# Patient Record
Sex: Female | Born: 1965 | Race: White | Hispanic: No | Smoking: Former smoker
Health system: Southern US, Community
[De-identification: ages and names within clinical notes are randomized; demographics above are authoritative.]

## PROBLEM LIST (undated history)

## (undated) DIAGNOSIS — E7800 Pure hypercholesterolemia, unspecified: Secondary | ICD-10-CM

## (undated) DIAGNOSIS — G35 Multiple sclerosis: Secondary | ICD-10-CM

## (undated) DIAGNOSIS — M545 Low back pain, unspecified: Secondary | ICD-10-CM

## (undated) DIAGNOSIS — R569 Unspecified convulsions: Secondary | ICD-10-CM

## (undated) DIAGNOSIS — G35D Multiple sclerosis, unspecified: Secondary | ICD-10-CM

## (undated) DIAGNOSIS — F0280 Dementia in other diseases classified elsewhere without behavioral disturbance: Secondary | ICD-10-CM

## (undated) DIAGNOSIS — E785 Hyperlipidemia, unspecified: Secondary | ICD-10-CM

## (undated) DIAGNOSIS — I10 Essential (primary) hypertension: Secondary | ICD-10-CM

## (undated) DIAGNOSIS — E559 Vitamin D deficiency, unspecified: Secondary | ICD-10-CM

## (undated) HISTORY — PX: OOPHORECTOMY: SHX86

## (undated) HISTORY — DX: Dementia in other diseases classified elsewhere, unspecified severity, without behavioral disturbance, psychotic disturbance, mood disturbance, and anxiety: F02.80

## (undated) HISTORY — DX: Multiple sclerosis, unspecified: G35.D

## (undated) HISTORY — DX: Essential (primary) hypertension: I10

## (undated) HISTORY — DX: Hyperlipidemia, unspecified: E78.5

## (undated) HISTORY — PX: ABDOMINAL HYSTERECTOMY: SHX81

## (undated) HISTORY — DX: Pure hypercholesterolemia, unspecified: E78.00

## (undated) HISTORY — DX: Unspecified convulsions: R56.9

## (undated) HISTORY — DX: Low back pain, unspecified: M54.50

## (undated) HISTORY — DX: Multiple sclerosis: G35

## (undated) HISTORY — DX: Vitamin D deficiency, unspecified: E55.9

---
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
## 2006-12-12 DIAGNOSIS — G35 Multiple sclerosis: Secondary | ICD-10-CM | POA: Insufficient documentation

## 2015-11-14 DIAGNOSIS — F0280 Dementia in other diseases classified elsewhere without behavioral disturbance: Secondary | ICD-10-CM | POA: Insufficient documentation

## 2017-07-16 DIAGNOSIS — R569 Unspecified convulsions: Secondary | ICD-10-CM | POA: Insufficient documentation

## 2017-12-19 DIAGNOSIS — Z993 Dependence on wheelchair: Secondary | ICD-10-CM | POA: Insufficient documentation

## 2019-06-05 ENCOUNTER — Ambulatory Visit: Payer: Medicaid Other | Admitting: Neurology

## 2019-06-12 ENCOUNTER — Encounter: Payer: Self-pay | Admitting: Neurology

## 2019-06-12 ENCOUNTER — Other Ambulatory Visit: Payer: Self-pay

## 2019-06-12 ENCOUNTER — Ambulatory Visit (INDEPENDENT_AMBULATORY_CARE_PROVIDER_SITE_OTHER): Payer: Medicare Other | Admitting: Neurology

## 2019-06-12 VITALS — BP 130/76 | HR 78 | Temp 97.6°F

## 2019-06-12 DIAGNOSIS — R569 Unspecified convulsions: Secondary | ICD-10-CM

## 2019-06-12 DIAGNOSIS — G35 Multiple sclerosis: Secondary | ICD-10-CM | POA: Diagnosis not present

## 2019-06-12 DIAGNOSIS — Z79899 Other long term (current) drug therapy: Secondary | ICD-10-CM | POA: Insufficient documentation

## 2019-06-12 DIAGNOSIS — F0280 Dementia in other diseases classified elsewhere without behavioral disturbance: Secondary | ICD-10-CM

## 2019-06-12 DIAGNOSIS — Z993 Dependence on wheelchair: Secondary | ICD-10-CM

## 2019-06-12 NOTE — Progress Notes (Signed)
[facility]  PATIENT: Brandy Mckinney DOB: [birth year]  REFERRING DOCTOR OR PCP: Salli Real MD SOURCE: Patient, notes from primary care, imaging and lab reports  _________________________________   HISTORICAL  CHIEF COMPLAINT:  Chief Complaint  Patient presents with  . New Patient (Initial Visit)    RM 13 referred by Salli Real, MD for MS  . Multiple Sclerosis    Was dx when she was 19; wheelchair bound last medication she was on was ocrevus cannot remember the last infusion     HISTORY OF PRESENT ILLNESS:  I had the pleasure of seeing your patient, Brandy Mckinney, at the MS center at [facility] for neurologic consultation regarding her multiple sclerosis.  She is a 53 year old woman who was diagnosed with MS at age 53 after presenting with optic neuritis in both eyes.  Over the next few years, she began to experience more difficulty with her gait.  This steadily progressed in her 51s 30s and 30s.  For many years, she used a walker.  About 7 or 8 years ago after repeated falls she stopped using the walker and became wheelchair-bound.  At some point, she was started on Avonex.  She remained on this medication for many years.  She continued to progress and was switched to Tecfidera.  She stayed on that medication for several years and then about 2 years ago was switched to Ocrevus.  Her last dose of Ocrevus was December 2019.  Her brother (power of attorney) reports that transportation has been a problem as she has the most difficulty getting out of the house.  Therefore, they would like to discuss different disease modifying therapies.  Most recently, she was seeing Dr. [name] in [place].  They moved from [place] about 3 months ago and are trying to establish neurologic care for her MS.  Currently, she is unable to walk.  She can help with transfers.  She is wheelchair bound and has left > right arm and leg weakness and spasticity.    Vision is  out of either eye but she can read large print out of either eye.  She cannot read small print.   She has urinary incontinence.   A catheter was stopped due to frequent UTI.      She has some memory loss and reduced attention.    She sleeps poorly some nights and has an irregular sleep schedule.   She usually sleeps 4-5 hours on one stretch and then takes 1 to 2 naps daily.    She has no depression or anxiety.     She also has a history of seizures.  These would occur more frequently in the past and she has not had any for several years.  She is on Keppra and tolerates it well.  She used to have a Home Health Aide covered by [place].    Data: I personally reviewed the MRI of the brain dated 02/19/2019 [facility]): It shows multiple T2/flair hyperintense foci in the brainstem, cerebellum and in the periventricular, juxtacortical and deep white matter of the hemispheres in a pattern compatible with MS.  There were no enhancing lesions.  I reviewed lab tests from 02/19/2019: CBC was normal.  Lymphocyte count was normal.  She had an mildly elevated ALT but normal AST.  Vitamin D was normal.  Hepatitis Be antigen and hepatitis B core antibody were negative.  Hep C antibody and RNA was negative.  HIV was negative.  QuantiFERON TB was negative.  Varicella-zoster IgG showed immunity.   REVIEW OF SYSTEMS: Constitutional: No fevers, chills, sweats, or change in appetite.  She has some insomnia and irregular sleep. Eyes: No eye pain.  She has reduced visual acuity. Ear, nose and throat: No hearing loss, ear pain, nasal congestion, sore throat Cardiovascular: No chest pain, palpitations Respiratory: No shortness of breath at rest or with exertion.   No wheezes GastrointestinaI: No nausea, vomiting, diarrhea, abdominal pain, fecal incontinence Genitourinary:She has incontinence  musculoskeletal:She notes myalgias  integumentary: No rash, pruritus, skin lesions Neurological: as  above Psychiatric: No depression at this time.  No anxiety Endocrine: No palpitations, diaphoresis, change in appetite, change in weigh or increased thirst Hematologic/Lymphatic: No anemia, purpura, petechiae. Allergic/Immunologic: No itchy/runny eyes, nasal congestion, recent allergic reactions, rashes  ALLERGIES: Allergies  Allergen Reactions  . Fentanyl     Other reaction(s): can not have anything on her skin    HOME MEDICATIONS:  Current Outpatient Medications:  .  Ascorbic Acid (VITAMIN C) 100 MG tablet, Take by mouth., Disp: , Rfl:  .  calcium citrate-vitamin D (CITRACAL+D) 315-200 MG-UNIT tablet, Take by mouth., Disp: , Rfl:  .  cyclobenzaprine (FLEXERIL) 10 MG tablet, Take 10 mg by mouth 3 (three) times daily., Disp: , Rfl:  .  FLUoxetine (PROZAC) 20 MG capsule, Take by mouth., Disp: , Rfl:  .  gabapentin (NEURONTIN) 300 MG capsule, TAKE 1 CAPSULE BY MOUTH 3 TIMES DAILY, Disp: , Rfl:  .  Inulin (FIBER CHOICE PO), Take by mouth., Disp: , Rfl:  .  levETIRAcetam (KEPPRA) 750 MG tablet, Take 750 mg by mouth 2 (two) times daily., Disp: , Rfl:  .  lisinopril (ZESTRIL) 20 MG tablet, Take by mouth., Disp: , Rfl:  .  Multiple Vitamins-Minerals (MULTIVITAMIN ADULT PO), Take by mouth., Disp: , Rfl:  .  omeprazole (PRILOSEC) 40 MG capsule, Take 40 mg by mouth daily., Disp: , Rfl:  .  simvastatin (ZOCOR) 20 MG tablet, Take by mouth., Disp: , Rfl:  .  VITAMIN D PO, Take by mouth., Disp: , Rfl:   PAST MEDICAL HISTORY: Past Medical History:  Diagnosis Date  . Early onset Alzheimer's dementia ([place])   . Hypercholesteremia   . Hyperlipidemia   . Hypertension   . Lumbosacral pain   . Multiple sclerosis ([place])   . Seizure ([place])   . Vitamin D deficiency     PAST SURGICAL HISTORY:   FAMILY HISTORY: No family history on file.  SOCIAL HISTORY:  Social History   Socioeconomic History  . Marital status: Divorced    Spouse name: Not on file  . Number of children: Not on file  .  Years of education: Not on file  . Highest education level: Not on file  Occupational History  . Not on file  Social Needs  . Financial resource strain: Not on file  . Food insecurity    Worry: Not on file    Inability: Not on file  . Transportation needs    Medical: Not on file    Non-medical: Not on file  Tobacco Use  . Smoking status: Former Smoker    Quit date: 2012    Years since quitting: 8.8  . Smokeless tobacco: Never Used  Substance and Sexual Activity  . Alcohol use: Not Currently  . Drug use: Never  . Sexual activity: Not on file  Lifestyle  . Physical activity    Days per week: Not on file    Minutes per session: Not on file  .  Stress: Not on file  Relationships  . Social Musician on phone: Not on file    Gets together: Not on file    Attends religious service: Not on file    Active member of club or organization: Not on file    Attends meetings of clubs or organizations: Not on file    Relationship status: Not on file  . Intimate partner violence    Fear of current or ex partner: Not on file    Emotionally abused: Not on file    Physically abused: Not on file    Forced sexual activity: Not on file  Other Topics Concern  . Not on file  Social History Narrative   Lives at home with brother tommy    Right handed      PHYSICAL EXAM  Vitals:   06/12/19 1324  BP: 130/76  Pulse: 78  Temp: 97.6 F (36.4 C)  TempSrc: Axillary    There is no height or weight on file to calculate BMI.   General: The patient is well-developed and well-nourished and in no acute distress  HEENT:  Head is [place]/AT.  Sclera are anicteric.    Neck: No carotid bruits are noted.  The neck is nontender.  Cardiovascular: The heart has a regular rate and rhythm with a normal S1 and S2. There were no murmurs, gallops or rubs.    Skin: Extremities are without rash or  edema.  She has contractures of the left hand.  Musculoskeletal: She has some muscle tenderness on  deep palpation.   Neurologic Exam  Mental status: The patient is alert and oriented to name and city but not date at the time of the examination.  She had reduced focus and attention and reduced immediate recall.   Speech is normal.  Cranial nerves: Extraocular movements are full. Pupils are equal, round, and reactive to light and accomodation.  Facial symmetry is present. There is good facial sensation to soft touch bilaterally.Facial strength is normal.  Trapezius and sternocleidomastoid strength is normal. No dysarthria is noted.  The tongue is midline, and the patient has symmetric elevation of the soft palate. No obvious hearing deficits are noted.  Motor:  Muscle bulk is normal.   Muscle tone is increased, left greater than right.  Strength was 3/5 in the left arm and 3-4 minus/5 in the left leg.  Strength was 4+/5 in the right arm and 4/5 in the right leg.  Sensory: Sensory testing is intact to pinprick, soft touch and vibration sensation in all 4 extremities.  Coordination: Cerebellar testing reveals good finger-nose-finger and heel-to-shin bilaterally.  Gait and station: Station is normal.   Gait is normal. Tandem gait is normal. Romberg is negative.   Reflexes: Deep tendon reflexes are increased in the legs..   Plantar responses are flexor.      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 Noble is a 53 year old woman with multiple sclerosis who is currently not on any disease modifying therapy.  Transportation has been  a problem for her and she was unable to keep many  of her Ocrevus infusion appointments in the past.  She and her brother most interested in switching to [facility].  We went over the risks and benefits including the risk of cancer.  We also discussed Zeposia as another option.  Due to the short course nature of Mavenclad they would prefer that.  I had her sign a service request form.  Most of her lab work has been done but I will get an updated CBC with differential, liver function test, hepatitis B surface antigen and antibody.  They understand that they would need to have blood work approximately 2 months after the first dose.  Return to see me in 3 months or sooner for new or worsening neurologic symptoms.  She will need to have CBC with differential and hepatic function test at that visit.  4 months after that visit she should return for additional blood work.  Thank you for asking me to see Ms. Sharen HonesGutierrez.  Please let me know if I can be of further assistance with her or other patients in the future.    Bea Duren A. Epimenio FootSater, MD, [facility],FAAN 06/12/2019, 6:09 PM Certified in Neurology, Clinical Neurophysiology, Sleep Medicine and Neuroimaging  [facility] [address], [address] [place], [place] [id number] [phone]

## 2019-06-13 LAB — CBC WITH DIFFERENTIAL/PLATELET
Basophils Absolute: 0.1 10*3/uL (ref 0.0–0.2)
Basos: 0 %
EOS (ABSOLUTE): 0.2 10*3/uL (ref 0.0–0.4)
Eos: 2 %
Hematocrit: 49.1 % — ABNORMAL HIGH (ref 34.0–46.6)
Hemoglobin: 16.2 g/dL — ABNORMAL HIGH (ref 11.1–15.9)
Immature Grans (Abs): 0.1 10*3/uL (ref 0.0–0.1)
Immature Granulocytes: 1 %
Lymphocytes Absolute: 1.7 10*3/uL (ref 0.7–3.1)
Lymphs: 14 %
MCH: 29.6 pg (ref 26.6–33.0)
MCHC: 33 g/dL (ref 31.5–35.7)
MCV: 90 fL (ref 79–97)
Monocytes Absolute: 0.6 10*3/uL (ref 0.1–0.9)
Monocytes: 5 %
Neutrophils Absolute: 9.4 10*3/uL — ABNORMAL HIGH (ref 1.4–7.0)
Neutrophils: 78 %
Platelets: 310 10*3/uL (ref 150–450)
RBC: 5.48 x10E6/uL — ABNORMAL HIGH (ref 3.77–5.28)
RDW: 14.3 % (ref 11.7–15.4)
WBC: 12.1 10*3/uL — ABNORMAL HIGH (ref 3.4–10.8)

## 2019-06-13 LAB — HEPATIC FUNCTION PANEL
ALT: 45 IU/L — ABNORMAL HIGH (ref 0–32)
AST: 29 IU/L (ref 0–40)
Albumin: 4.3 g/dL (ref 3.8–4.9)
Alkaline Phosphatase: 161 IU/L — ABNORMAL HIGH (ref 39–117)
Bilirubin Total: 1 mg/dL (ref 0.0–1.2)
Bilirubin, Direct: 0.31 mg/dL (ref 0.00–0.40)
Total Protein: 7.1 g/dL (ref 6.0–8.5)

## 2019-06-13 LAB — HEPATITIS B SURFACE ANTIGEN: Hepatitis B Surface Ag: NEGATIVE

## 2019-06-13 LAB — HEPATITIS B CORE ANTIBODY, TOTAL: Hep B Core Total Ab: NEGATIVE

## 2019-06-13 LAB — HEPATITIS B SURFACE ANTIBODY,QUALITATIVE: Hep B Surface Ab, Qual: NONREACTIVE

## 2019-06-14 ENCOUNTER — Telehealth: Payer: Self-pay

## 2019-06-14 NOTE — Telephone Encounter (Signed)
[employer] start form has been faxed to [place]. Confirmation received.  First month cycle will be: Day 1- 2 Day 2-2 Day 3- 2 Day 4- 1 Day 5- 1  Second month cycle will be: Day 1- 2 Day 2- 2 Day 3- 1 Day 4- 1 Day 5 -1

## 2019-06-28 NOTE — Telephone Encounter (Signed)
Called [facility] to check on status and make sure they received start form. Spoke with Mikael Spray. She states they received form incomplete on 06/14/2019, missing MD info. They faxed back on 06/14/19 requesting this be completed.   I completed missing fields and re-faxed to [facility] at [phone]. Received fax confirmation.

## 2019-07-11 NOTE — Telephone Encounter (Signed)
Called and spoke with Sharee Pimple w/ [facility] to see status of start form. She states No PA needed but there is information missing on the rx. She was not sure what was missing, placed me on hold to investigate. I relayed we refaxed on 06/28/2019 with completed info.  She is going to forward newer form to pharmacy, old form forwarded to pharmacy in error that had missing info. She will be using [facility] pharmacy Phone: [phone]. Ext: [phone] for MS team. She is going to triage this request. I asked her to place our direct fax: [phone] on file in case they need it.

## 2019-07-12 NOTE — Telephone Encounter (Signed)
Received fax from [place] that pt scheduled delivery of [place] to receive it on 07/17/2019. [facility] ID: [id number].

## 2019-08-20 ENCOUNTER — Telehealth: Payer: Self-pay | Admitting: *Deleted

## 2019-08-20 NOTE — Telephone Encounter (Signed)
Received fax from [facility] that pt scheduled delivery of Mavenclad to receive it on 08/18/2019. [facility] ID: [id number].

## 2019-08-27 ENCOUNTER — Telehealth: Payer: Self-pay | Admitting: *Deleted

## 2019-08-27 NOTE — Telephone Encounter (Signed)
Received initial counseling checklist from [facility] in re: Mavenclad. Pt completed year 1 month 1: 07-18-2019-07-22-19. Pt completed year 1 month 2 08/18/19-08/22/19.

## 2019-09-08 ENCOUNTER — Encounter: Payer: Self-pay | Admitting: Neurology

## 2019-09-18 ENCOUNTER — Other Ambulatory Visit: Payer: Self-pay

## 2019-09-18 ENCOUNTER — Encounter: Payer: Self-pay | Admitting: Neurology

## 2019-09-18 ENCOUNTER — Ambulatory Visit (INDEPENDENT_AMBULATORY_CARE_PROVIDER_SITE_OTHER): Payer: Medicare Other | Admitting: Neurology

## 2019-09-18 VITALS — BP 110/86 | HR 80 | Temp 97.7°F | Ht 62.0 in

## 2019-09-18 DIAGNOSIS — F0280 Dementia in other diseases classified elsewhere without behavioral disturbance: Secondary | ICD-10-CM | POA: Diagnosis not present

## 2019-09-18 DIAGNOSIS — Z993 Dependence on wheelchair: Secondary | ICD-10-CM

## 2019-09-18 DIAGNOSIS — G35 Multiple sclerosis: Secondary | ICD-10-CM

## 2019-09-18 DIAGNOSIS — Z79899 Other long term (current) drug therapy: Secondary | ICD-10-CM | POA: Diagnosis not present

## 2019-09-18 DIAGNOSIS — R569 Unspecified convulsions: Secondary | ICD-10-CM | POA: Diagnosis not present

## 2019-09-18 MED ORDER — ETODOLAC 400 MG PO TABS: 400.0000 mg | ORAL_TABLET | Freq: Two times a day (BID) | ORAL | 5 refills | Status: DC | PRN

## 2019-09-18 NOTE — Progress Notes (Addendum)
[facility]  PATIENT: Brandy Mckinney DOB: February 07, 1966  REFERRING DOCTOR OR PCP: Sandi Mariscal MD SOURCE: Patient, notes from primary care, imaging and lab reports  _________________________________   HISTORICAL  CHIEF COMPLAINT:  Chief Complaint  Patient presents with  . Follow-up    Room 13. Last seen 06/12/2019. In rolling chair today, non-ambulatory.   . Multiple Sclerosis    On Mavenclad, tolerating well. No new sx.    HISTORY OF PRESENT ILLNESS:  Brandy Mckinney is a 54 y.o. woman with multiple sclerosis.  Update 09/18/2019: She started [facility] in December 19 and had her second week starting January 23.   She tolerated it very well.   She denies any change in neurologic function compared to last visit.      Vision is poor but unchanged.    Cognition is about the same.   She often has mild confusion.      She is in a rolling chair.  She is non-ambulatory.    She does not help significantly with transfers.   She is weaker left worse than right.      She was recently been diagnosed with fatty liver and will be having a CT soon.     She is on Keppra for seizures but has not had any recently.    FROM 06/12/2019 CONSULTATION She is a 54 year old woman who was diagnosed with MS at age 80 after presenting with optic neuritis in both eyes.  Over the next few years, she began to experience more difficulty with her gait.  This steadily progressed in her 58s 57s and 33s.  For many years, she used a walker.  About 7 or 8 years ago after repeated falls she stopped using the walker and became wheelchair-bound.  At some point, she was started on Avonex.  She remained on this medication for many years.  She continued to progress and was switched to Tecfidera.  She stayed on that medication for several years and then about 2 years ago was switched to [place].  Her last dose of Ocrevus was December 2019.  Her brother (power of attorney) reports that transportation has  been a problem as she has the most difficulty getting out of the house.  Therefore, they would like to discuss different disease modifying therapies.  Most recently, she was seeing Dr. Earnie Larsson in [place].  They moved from [place] about 3 months ago and are trying to establish neurologic care for her MS.  Currently, she is unable to walk.  She can help with transfers.  She is wheelchair bound and has left > right arm and leg weakness and spasticity.    Vision is out of either eye but she can read large print out of either eye.  She cannot read small print.   She has urinary incontinence.   A catheter was stopped due to frequent UTI.      She has some memory loss and reduced attention.    She sleeps poorly some nights and has an irregular sleep schedule.   She usually sleeps 4-5 hours on one stretch and then takes 1 to 2 naps daily.    She has no depression or anxiety.     She also has a history of seizures.  These would occur more frequently in the past and she has not had any for several years.  She is on Keppra and tolerates it well.  She used to have a [place] covered by [place].  Data: I personally reviewed the MRI of the brain dated 02/19/2019 [facility]): It shows multiple T2/flair hyperintense foci in the brainstem, cerebellum and in the periventricular, juxtacortical and deep white matter of the hemispheres in a pattern compatible with MS.  There were no enhancing lesions.  I reviewed lab tests from 02/19/2019: CBC was normal.  Lymphocyte count was normal.  She had an mildly elevated ALT but normal AST.  Vitamin D was normal.  Hepatitis Be antigen and hepatitis B core antibody were negative.  Hep C antibody and RNA was negative.  HIV was negative.  QuantiFERON TB was negative.  Varicella-zoster IgG showed immunity.   REVIEW OF SYSTEMS: Constitutional: No fevers, chills, sweats, or change in appetite.  She has some insomnia and irregular sleep. Eyes: No  eye pain.  She has reduced visual acuity. Ear, nose and throat: No hearing loss, ear pain, nasal congestion, sore throat Cardiovascular: No chest pain, palpitations Respiratory: No shortness of breath at rest or with exertion.   No wheezes GastrointestinaI: No nausea, vomiting, diarrhea, abdominal pain, fecal incontinence Genitourinary:She has incontinence  musculoskeletal:She notes myalgias  integumentary: No rash, pruritus, skin lesions Neurological: as above Psychiatric: No depression at this time.  No anxiety Endocrine: No palpitations, diaphoresis, change in appetite, change in weigh or increased thirst Hematologic/Lymphatic: No anemia, purpura, petechiae. Allergic/Immunologic: No itchy/runny eyes, nasal congestion, recent allergic reactions, rashes  ALLERGIES: Allergies  Allergen Reactions  . Fentanyl     Other reaction(s): can not have anything on her skin    HOME MEDICATIONS:  Current Outpatient Medications:  .  Ascorbic Acid (VITAMIN C) 100 MG tablet, Take by mouth., Disp: , Rfl:  .  calcium citrate-vitamin D (CITRACAL+D) 315-200 MG-UNIT tablet, Take by mouth., Disp: , Rfl:  .  Cladribine, 8 Tabs, (MAVENCLAD, 8 TABS,) 10 MG TBPK, Take by mouth. Month 1: Take 2 tablets daily on days 1-3, 1 tablet on day 4 and 5.  Month 2: Take 2 tablets daily on days 1-2, 1 tablet on days 3-5., Disp: , Rfl:  .  cyclobenzaprine (FLEXERIL) 10 MG tablet, Take 10 mg by mouth 3 (three) times daily., Disp: , Rfl:  .  FLUoxetine (PROZAC) 20 MG capsule, Take by mouth., Disp: , Rfl:  .  gabapentin (NEURONTIN) 300 MG capsule, TAKE 1 CAPSULE BY MOUTH 3 TIMES DAILY, Disp: , Rfl:  .  Inulin (FIBER CHOICE PO), Take by mouth., Disp: , Rfl:  .  levETIRAcetam (KEPPRA) 750 MG tablet, Take 750 mg by mouth 2 (two) times daily., Disp: , Rfl:  .  lisinopril (ZESTRIL) 20 MG tablet, Take by mouth., Disp: , Rfl:  .  Multiple Vitamins-Minerals (MULTIVITAMIN ADULT PO), Take by mouth., Disp: , Rfl:  .  omeprazole  (PRILOSEC) 40 MG capsule, Take 40 mg by mouth daily., Disp: , Rfl:  .  simvastatin (ZOCOR) 20 MG tablet, Take by mouth., Disp: , Rfl:  .  VITAMIN D PO, Take by mouth., Disp: , Rfl:  .  etodolac (LODINE) 400 MG tablet, Take 1 tablet (400 mg total) by mouth 2 (two) times daily as needed., Disp: 60 tablet, Rfl: 5  PAST MEDICAL HISTORY: Past Medical History:  Diagnosis Date  . Early onset Alzheimer's dementia (HCC)   . Hypercholesteremia   . Hyperlipidemia   . Hypertension   . Lumbosacral pain   . Multiple sclerosis (HCC)   . Seizure (HCC)   . Vitamin D deficiency     PAST SURGICAL HISTORY:   FAMILY HISTORY: History reviewed.  No pertinent family history.  SOCIAL HISTORY:  Social History   Socioeconomic History  . Marital status: Divorced    Spouse name: Not on file  . Number of children: Not on file  . Years of education: Not on file  . Highest education level: Not on file  Occupational History  . Not on file  Tobacco Use  . Smoking status: Former Smoker    Quit date: 2012    Years since quitting: 9.1  . Smokeless tobacco: Never Used  Substance and Sexual Activity  . Alcohol use: Not Currently  . Drug use: Never  . Sexual activity: Not on file  Other Topics Concern  . Not on file  Social History Narrative   Lives at home with brother tommy    Right handed    Social Determinants of Health   Financial Resource Strain:   . Difficulty of Paying Living Expenses: Not on file  Food Insecurity:   . Worried About Programme researcher, broadcasting/film/video in the Last Year: Not on file  . Ran Out of Food in the Last Year: Not on file  Transportation Needs:   . Lack of Transportation (Medical): Not on file  . Lack of Transportation (Non-Medical): Not on file  Physical Activity:   . Days of Exercise per Week: Not on file  . Minutes of Exercise per Session: Not on file  Stress:   . Feeling of Stress : Not on file  Social Connections:   . Frequency of Communication with Friends and Family:  Not on file  . Frequency of Social Gatherings with Friends and Family: Not on file  . Attends Religious Services: Not on file  . Active Member of Clubs or Organizations: Not on file  . Attends Banker Meetings: Not on file  . Marital Status: Not on file  Intimate Partner Violence:   . Fear of Current or Ex-Partner: Not on file  . Emotionally Abused: Not on file  . Physically Abused: Not on file  . Sexually Abused: Not on file     PHYSICAL EXAM  Vitals:   09/18/19 1535  BP: 110/86  Pulse: 80  Temp: 97.7 F (36.5 C)  Height: 5\' 2"  (1.575 m)    There is no height or weight on file to calculate BMI.   General: The patient is well-developed and well-nourished and in no acute distress  HEENT:  Head is [place]/AT.  Sclera are anicteric.     Musculoskeletal: She has some muscle tenderness on deep palpation.   Neurologic Exam  Mental status: The patient is alert and oriented to name and city but not date at the time of the examination.  She had reduced focus and attention and reduced immediate recall.   Speech is normal.  Cranial nerves: Extraocular movements show bilateral INO.  There is good facial sensation to soft touch bilaterally.Facial strength is normal.  Trapezius and sternocleidomastoid strength is normal.  No obvious hearing deficits are noted.  Motor:  Muscle bulk is normal.   Muscle tone is increased, left greater than right.  Strength was 3/5 in the left arm and 3-4 minus/5 in the left leg.  Strength was 4+/5 in the right arm and 4- to 4/5 in the right leg.  Sensory: Sensory testing is intact to pinprick, soft touch and vibration sensation in all 4 extremities.  Coordination: Cerebellar testing reduced right and poor left finger-nose-finger .   Cannot do HTs Gait and station: can not stand or walk Reflexes: Deep  tedon reflexes are increased in the legs..         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  She has completed year one of Mavenclad.   We will check labs and have her get the second year In December/January.   Check labs. 2.   Continue Keppra 3.   Letter for ramp 4.   Etodolac prn pain 5.   rtc 7 months   --- will need to check Hep B/TB other labs at that time   Criss Pallone A. Epimenio Foot, MD, [facility] 09/18/2019, 4:10 PM Certified in Neurology, Clinical Neurophysiology, Sleep Medicine and Neuroimaging  [facility] [address] [place], [place] [address] [phone]

## 2019-09-18 NOTE — Progress Notes (Signed)
    09/18/2019  Re:  Brandy Mckinney  [birth year]   To Whom It May Concern:  Brandy Mckinney is a 54 year old woman with severe multiple sclerosis who is followed at the MS center at [facility].   She is wheelchair bound.   Due to several steps at the door, her family has trouble getting her out of the house in her rolling chair.  It is medically necessary to have a ramp to allow to safely leave the house.  Please let me know if you need any further information.  Sincerely,   Daryl Beehler A. Epimenio Foot, MD, PhD, FAAN Certified in Neurology, Clinical Neurophysiology, Sleep Medicine, Pain Medicine and Neuroimaging Director, Multiple Sclerosis Center at [facility]  [facility] [address], [address] [place], [place] [address] [phone]

## 2019-09-19 ENCOUNTER — Telehealth: Payer: Self-pay | Admitting: *Deleted

## 2019-09-19 LAB — CBC WITH DIFFERENTIAL/PLATELET
Basophils Absolute: 0.1 10*3/uL (ref 0.0–0.2)
Basos: 1 %
EOS (ABSOLUTE): 0.2 10*3/uL (ref 0.0–0.4)
Eos: 2 %
Hematocrit: 48.1 % — ABNORMAL HIGH (ref 34.0–46.6)
Hemoglobin: 15.7 g/dL (ref 11.1–15.9)
Immature Grans (Abs): 0.1 10*3/uL (ref 0.0–0.1)
Immature Granulocytes: 1 %
Lymphocytes Absolute: 1.1 10*3/uL (ref 0.7–3.1)
Lymphs: 12 %
MCH: 29.6 pg (ref 26.6–33.0)
MCHC: 32.6 g/dL (ref 31.5–35.7)
MCV: 91 fL (ref 79–97)
Monocytes Absolute: 0.6 10*3/uL (ref 0.1–0.9)
Monocytes: 7 %
Neutrophils Absolute: 7.5 10*3/uL — ABNORMAL HIGH (ref 1.4–7.0)
Neutrophils: 77 %
Platelets: 254 10*3/uL (ref 150–450)
RBC: 5.3 x10E6/uL — ABNORMAL HIGH (ref 3.77–5.28)
RDW: 14.4 % (ref 11.7–15.4)
WBC: 9.6 10*3/uL (ref 3.4–10.8)

## 2019-09-19 NOTE — Telephone Encounter (Signed)
Called and spoke with Maisie Fus (brother on [place]) and advised labs were fine per Dr. Epimenio Foot. He verbalized understanding.

## 2019-09-19 NOTE — Telephone Encounter (Signed)
-----   Message from Asa Lente, MD sent at 09/19/2019  8:38 AM EST ----- Please let the patient know that the lab work is fine.

## 2019-10-03 LAB — COLOGUARD: COLOGUARD: NEGATIVE

## 2019-11-15 ENCOUNTER — Ambulatory Visit: Payer: Medicaid Other | Attending: Internal Medicine

## 2019-11-15 DIAGNOSIS — Z23 Encounter for immunization: Secondary | ICD-10-CM

## 2019-11-15 NOTE — Progress Notes (Signed)
   Covid-19 Vaccination Clinic  Name:  Brandy Mckinney    MRN: [id number] DOB: [birth year]  11/15/2019  [name] was observed post Covid-19 immunization for 15 minutes without incident. She was provided with Vaccine Information Sheet and instruction to access the V-Safe system.   [name] was instructed to call 911 with any severe reactions post vaccine: Marland Kitchen Difficulty breathing  . Swelling of face and throat  . A fast heartbeat  . A bad rash all over body  . Dizziness and weakness   Immunizations Administered    Name Date Dose VIS Date Route   Pfizer COVID-19 Vaccine 11/15/2019  8:43 AM 0.3 mL 09/19/2018 Intramuscular   Manufacturer: [employer], [employer]   Lot: [id number]   NDC: [address]-5

## 2019-12-10 ENCOUNTER — Ambulatory Visit: Payer: Medicare Other

## 2020-03-22 ENCOUNTER — Other Ambulatory Visit: Payer: Self-pay | Admitting: Neurology

## 2020-04-15 ENCOUNTER — Other Ambulatory Visit: Payer: Self-pay

## 2020-04-15 ENCOUNTER — Encounter: Payer: Self-pay | Admitting: Neurology

## 2020-04-15 ENCOUNTER — Ambulatory Visit (INDEPENDENT_AMBULATORY_CARE_PROVIDER_SITE_OTHER): Payer: Medicare Other | Admitting: Neurology

## 2020-04-15 VITALS — BP 138/78 | HR 96

## 2020-04-15 DIAGNOSIS — Z79899 Other long term (current) drug therapy: Secondary | ICD-10-CM | POA: Diagnosis not present

## 2020-04-15 DIAGNOSIS — G35 Multiple sclerosis: Secondary | ICD-10-CM

## 2020-04-15 DIAGNOSIS — F0280 Dementia in other diseases classified elsewhere without behavioral disturbance: Secondary | ICD-10-CM | POA: Diagnosis not present

## 2020-04-15 DIAGNOSIS — R569 Unspecified convulsions: Secondary | ICD-10-CM

## 2020-04-15 DIAGNOSIS — Z993 Dependence on wheelchair: Secondary | ICD-10-CM

## 2020-04-15 NOTE — Progress Notes (Signed)
[facility]  PATIENT: Brandy Mckinney DOB: 03/07/1966  REFERRING DOCTOR OR PCP: Salli Real MD SOURCE: Patient, notes from primary care, imaging and lab reports  _________________________________   HISTORICAL  CHIEF COMPLAINT:  Chief Complaint  Patient presents with  . Follow-up    Rm 12. Last seen 09/18/19  . Multiple Sclerosis    On Mavenclad     HISTORY OF PRESENT ILLNESS:  Brandy Mckinney is a 54 y.o. woman with multiple sclerosis.  Update 04/15/2020: She had her Mavenclad in December 2020 and had her second week starting January 2021   She tolerated it very well with no side effects.   She denies any change in neurologic function compared to last visit.       Cognition is about with reduced STM, focus/attention.   She often has mild confusion.   She jumbles her words and also has wird finding difficultiy   She is wheelchair bound and is non-ambulatory.    She does not help significantly with transfers.   She is weaker on her left side than right.   She cannot turn over in bed.  She is getting home health.   She gets help for 4 hours a day.  Since she is wheelchair bound and brother can't help throughout the whole day, they are wondering if more help is available.   Vision is poor but unchanged.     She was recently been diagnosed with fatty liver and will be having a CT soon.     She is on Keppra for seizures but has not had any recently.   MS history:  She was diagnosed with MS at age 89 after presenting with optic neuritis in both eyes.  Over the next few years, she began to experience more difficulty with her gait.  This steadily progressed in her 27s 30s and 67s.  For many years, she used a walker.  About 7 or 8 years ago after repeated falls she stopped using the walker and became wheelchair-bound.  At some point, she was started on Avonex.  She remained on this medication for many years.  She continued to progress and was switched to Tecfidera.   She stayed on that medication for several years and then in 2017 was switched to Ocrevus. Travel was difficult for her to do. In 2020 she switched to [facility]. She had the first course December 2020 in January 2021.   Other neurologic history: She has a history of seizures. They have been well controlled on levetiracetam.  Data: MRI of the brain dated 02/19/2019 [facility]): It shows multiple T2/flair hyperintense foci in the brainstem, cerebellum and in the periventricular, juxtacortical and deep white matter of the hemispheres in a pattern compatible with MS.  There were no enhancing lesions.  I reviewed lab tests from 02/19/2019: CBC was normal.  Lymphocyte count was normal.  She had an mildly elevated ALT but normal AST.  Vitamin D was normal.  Hepatitis Be antigen and hepatitis B core antibody were negative.  Hep C antibody and RNA was negative.  HIV was negative.  QuantiFERON TB was negative.  Varicella-zoster IgG showed immunity.   REVIEW OF SYSTEMS: Constitutional: No fevers, chills, sweats, or change in appetite.  She has some insomnia and irregular sleep. Eyes: No eye pain.  She has reduced visual acuity. Ear, nose and throat: No hearing loss, ear pain, nasal congestion, sore throat Cardiovascular: No chest pain, palpitations Respiratory: No shortness of breath at rest or with  exertion.   No wheezes GastrointestinaI: No nausea, vomiting, diarrhea, abdominal pain, fecal incontinence Genitourinary:She has incontinence  musculoskeletal:She notes myalgias  integumentary: No rash, pruritus, skin lesions Neurological: as above Psychiatric: No depression at this time.  No anxiety Endocrine: No palpitations, diaphoresis, change in appetite, change in weigh or increased thirst Hematologic/Lymphatic: No anemia, purpura, petechiae. Allergic/Immunologic: No itchy/runny eyes, nasal congestion, recent allergic reactions, rashes  ALLERGIES: Allergies  Allergen Reactions  .  Fentanyl     Other reaction(s): can not have anything on her skin    HOME MEDICATIONS:  Current Outpatient Medications:  .  Ascorbic Acid (VITAMIN C) 100 MG tablet, Take by mouth., Disp: , Rfl:  .  calcium citrate-vitamin D (CITRACAL+D) 315-200 MG-UNIT tablet, Take by mouth., Disp: , Rfl:  .  Cladribine, 8 Tabs, (MAVENCLAD, 8 TABS,) 10 MG TBPK, Take by mouth. Month 1: Take 2 tablets daily on days 1-3, 1 tablet on day 4 and 5.  Month 2: Take 2 tablets daily on days 1-2, 1 tablet on days 3-5., Disp: , Rfl:  .  cyclobenzaprine (FLEXERIL) 10 MG tablet, Take 10 mg by mouth 3 (three) times daily., Disp: , Rfl:  .  etodolac (LODINE) 400 MG tablet, TAKE 1 TABLET (400 MG TOTAL) BY MOUTH 2 (TWO) TIMES DAILY AS NEEDED., Disp: 60 tablet, Rfl: 5 .  FLUoxetine (PROZAC) 20 MG capsule, Take by mouth., Disp: , Rfl:  .  gabapentin (NEURONTIN) 300 MG capsule, TAKE 1 CAPSULE BY MOUTH 3 TIMES DAILY, Disp: , Rfl:  .  Inulin (FIBER CHOICE PO), Take by mouth., Disp: , Rfl:  .  levETIRAcetam (KEPPRA) 750 MG tablet, Take 750 mg by mouth 2 (two) times daily., Disp: , Rfl:  .  lisinopril (ZESTRIL) 20 MG tablet, Take by mouth., Disp: , Rfl:  .  Multiple Vitamins-Minerals (MULTIVITAMIN ADULT PO), Take by mouth., Disp: , Rfl:  .  omeprazole (PRILOSEC) 40 MG capsule, Take 40 mg by mouth daily., Disp: , Rfl:  .  simvastatin (ZOCOR) 20 MG tablet, Take by mouth., Disp: , Rfl:  .  VITAMIN D PO, Take by mouth., Disp: , Rfl:   PAST MEDICAL HISTORY: Past Medical History:  Diagnosis Date  . Early onset Alzheimer's dementia (HCC)   . Hypercholesteremia   . Hyperlipidemia   . Hypertension   . Lumbosacral pain   . Multiple sclerosis (HCC)   . Seizure (HCC)   . Vitamin D deficiency     PAST SURGICAL HISTORY:   FAMILY HISTORY: History reviewed. No pertinent family history.  SOCIAL HISTORY:  Social History   Socioeconomic History  . Marital status: Divorced    Spouse name: Not on file  . Number of children: Not on  file  . Years of education: Not on file  . Highest education level: Not on file  Occupational History  . Not on file  Tobacco Use  . Smoking status: Former Smoker    Quit date: 2012    Years since quitting: 9.7  . Smokeless tobacco: Never Used  Substance and Sexual Activity  . Alcohol use: Not Currently  . Drug use: Never  . Sexual activity: Not on file  Other Topics Concern  . Not on file  Social History Narrative   Lives at home with brother tommy    Right handed    Social Determinants of Health   Financial Resource Strain:   . Difficulty of Paying Living Expenses: Not on file  Food Insecurity:   . Worried About [employer] of  Food in the Last Year: Not on file  . Ran Out of Food in the Last Year: Not on file  Transportation Needs:   . Lack of Transportation (Medical): Not on file  . Lack of Transportation (Non-Medical): Not on file  Physical Activity:   . Days of Exercise per Week: Not on file  . Minutes of Exercise per Session: Not on file  Stress:   . Feeling of Stress : Not on file  Social Connections:   . Frequency of Communication with Friends and Family: Not on file  . Frequency of Social Gatherings with Friends and Family: Not on file  . Attends Religious Services: Not on file  . Active Member of Clubs or Organizations: Not on file  . Attends Banker Meetings: Not on file  . Marital Status: Not on file  Intimate Partner Violence:   . Fear of Current or Ex-Partner: Not on file  . Emotionally Abused: Not on file  . Physically Abused: Not on file  . Sexually Abused: Not on file     PHYSICAL EXAM  Vitals:   04/15/20 1536  BP: 138/78  Pulse: 96    There is no height or weight on file to calculate BMI.   General: The patient is well-developed and well-nourished and in no acute distress  HEENT:  Head is [place]/AT.  Sclera are anicteric.     Musculoskeletal: She has some muscle tenderness on deep palpation.   Neurologic Exam  Mental  status: The patient is alert and oriented to name and city but not date at the time of the examination.  She had reduced focus and attention and reduced immediate recall.   Speech is normal.  Cranial nerves: She has bilateral INO.  There is good facial sensation to soft touch bilaterally facial strength and sensation was normal..  Trapezius and sternocleidomastoid strength is normal.  No obvious hearing deficits are noted.  Motor:  Muscle bulk is normal.   Muscle tone is increased, left greater than right.  Strength was 3/5 in the left arm and 2+ to 3/5 in the left leg.  Strength was 4 to 4+/5 in the right arm and 4-/5 in the right leg.  Sensory: Sensory testing is intact to pinprick, soft touch and vibration sensation in all 4 extremities.  Coordination: Cerebellar testing reduced right and poor left finger-nose-finger .   Cannot do HTS  Gait and station: can not stand or walk  Reflexes: Deep tedon reflexes are increased in the legs..         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  She has completed year one of Mavenclad.   We will check labs and have her get the second year In December/January.   Check labs. 2.   Continue Keppra 3.   Letter for home health aide 4.   rtc 6 months or sooner if there are new or worsening neurologic  symptoms.  45-minute office visit with the majority of  the time spent face-to-face for history and physical, discussion/counseling and decision-making.  Additional time with record review and documentation.  Woodie Trusty A. Epimenio Foot, MD, [facility] 04/15/2020, 5:44 PM Certified in Neurology, Clinical Neurophysiology, Sleep Medicine and Neuroimaging  [facility] [address] [place], [place] [address] [phone]

## 2020-04-16 ENCOUNTER — Telehealth: Payer: Self-pay | Admitting: Neurology

## 2020-04-16 NOTE — Telephone Encounter (Signed)
[employer] medicare/medicaid order sent to [facility]. No auth they will reach out to the patient to schedule.  °

## 2020-04-18 LAB — CBC WITH DIFFERENTIAL/PLATELET
Basophils Absolute: 0 10*3/uL (ref 0.0–0.2)
Basos: 0 %
EOS (ABSOLUTE): 0.2 10*3/uL (ref 0.0–0.4)
Eos: 2 %
Hematocrit: 48.4 % — ABNORMAL HIGH (ref 34.0–46.6)
Hemoglobin: 15.6 g/dL (ref 11.1–15.9)
Immature Grans (Abs): 0.1 10*3/uL (ref 0.0–0.1)
Immature Granulocytes: 1 %
Lymphocytes Absolute: 1.3 10*3/uL (ref 0.7–3.1)
Lymphs: 15 %
MCH: 28.8 pg (ref 26.6–33.0)
MCHC: 32.2 g/dL (ref 31.5–35.7)
MCV: 89 fL (ref 79–97)
Monocytes Absolute: 0.5 10*3/uL (ref 0.1–0.9)
Monocytes: 5 %
Neutrophils Absolute: 7 10*3/uL (ref 1.4–7.0)
Neutrophils: 77 %
Platelets: 257 10*3/uL (ref 150–450)
RBC: 5.42 x10E6/uL — ABNORMAL HIGH (ref 3.77–5.28)
RDW: 14.2 % (ref 11.7–15.4)
WBC: 9.1 10*3/uL (ref 3.4–10.8)

## 2020-04-18 LAB — COMPREHENSIVE METABOLIC PANEL
ALT: 43 IU/L — ABNORMAL HIGH (ref 0–32)
AST: 32 IU/L (ref 0–40)
Albumin/Globulin Ratio: 1.8 (ref 1.2–2.2)
Albumin: 4.1 g/dL (ref 3.8–4.9)
Alkaline Phosphatase: 157 IU/L — ABNORMAL HIGH (ref 44–121)
BUN/Creatinine Ratio: 16 (ref 9–23)
BUN: 8 mg/dL (ref 6–24)
Bilirubin Total: 1 mg/dL (ref 0.0–1.2)
CO2: 26 mmol/L (ref 20–29)
Calcium: 9.4 mg/dL (ref 8.7–10.2)
Chloride: 99 mmol/L (ref 96–106)
Creatinine, Ser: 0.5 mg/dL — ABNORMAL LOW (ref 0.57–1.00)
GFR calc Af Amer: 130 mL/min/{1.73_m2} (ref >59)
GFR calc non Af Amer: 112 mL/min/{1.73_m2} (ref >59)
Globulin, Total: 2.3 g/dL (ref 1.5–4.5)
Glucose: 131 mg/dL — ABNORMAL HIGH (ref 65–99)
Potassium: 4.1 mmol/L (ref 3.5–5.2)
Sodium: 139 mmol/L (ref 134–144)
Total Protein: 6.4 g/dL (ref 6.0–8.5)

## 2020-04-18 LAB — QUANTIFERON-TB GOLD PLUS
QuantiFERON Mitogen Value: >10 IU/mL
QuantiFERON Nil Value: 0.03 IU/mL
QuantiFERON TB1 Ag Value: 0.06 IU/mL
QuantiFERON TB2 Ag Value: 0.04 IU/mL
QuantiFERON-TB Gold Plus: NEGATIVE

## 2020-04-18 LAB — HEPATITIS B SURFACE ANTIGEN: Hepatitis B Surface Ag: NEGATIVE

## 2020-04-18 LAB — HEPATITIS B SURFACE ANTIBODY,QUALITATIVE: Hep B Surface Ab, Qual: NONREACTIVE

## 2020-04-18 LAB — HEPATITIS B CORE ANTIBODY, TOTAL: Hep B Core Total Ab: NEGATIVE

## 2020-04-18 LAB — HIV ANTIBODY (ROUTINE TESTING W REFLEX): HIV Screen 4th Generation wRfx: NONREACTIVE

## 2020-04-21 ENCOUNTER — Telehealth: Payer: Self-pay | Admitting: Emergency Medicine

## 2020-04-21 NOTE — Telephone Encounter (Signed)
Called and spoke to patient's brother Maisie Fus.  He was informed the Olin Pia would be good to start again in December, Kristen would contact them for scheduling once all paperwork was complete.  He expressed appreciation and denied any questions.

## 2020-04-21 NOTE — Telephone Encounter (Signed)
-----   Message from Asa Lente, MD sent at 04/19/2020  3:31 PM EDT ----- Labs are fine.   She can do the second year Mavenclad starting December

## 2020-04-21 NOTE — Telephone Encounter (Signed)
Faxed Mavenclad start form to [facility]. Received a receipt of confirmation.  

## 2020-04-28 NOTE — Telephone Encounter (Signed)
Patient has a wheel chair. She is scheduled at [facility] for Wednesday 05/07/20 arrival time is 3:30 pm. I did leave a voicemail to inform the patient I also left their number of [phone] incase she needed to r/s.

## 2020-04-29 NOTE — Telephone Encounter (Signed)
I called [facility]. I spoke with Liborio Nixon. The RX for mavenclad was triaged to [facility]. It will not ship until late November. A PA does not appear to be needed. I will continue to follow.

## 2020-05-07 ENCOUNTER — Ambulatory Visit (HOSPITAL_COMMUNITY): Payer: Medicare Other

## 2020-05-13 ENCOUNTER — Other Ambulatory Visit: Payer: Self-pay

## 2020-05-13 ENCOUNTER — Ambulatory Visit (HOSPITAL_COMMUNITY)
Admission: RE | Admit: 2020-05-13 | Discharge: 2020-05-13 | Disposition: A | Discharge status: Home / Self Care | Payer: Medicare Other | Source: Ambulatory Visit | Attending: Neurology | Admitting: Neurology

## 2020-05-13 DIAGNOSIS — G35 Multiple sclerosis: Secondary | ICD-10-CM | POA: Diagnosis present

## 2020-05-13 MED ORDER — GADOBUTROL 1 MMOL/ML IV SOLN
8.0000 mL | Freq: Once | INTRAVENOUS | Status: AC | PRN
  Administered 2020-05-13: 8 mL via INTRAVENOUS

## 2020-05-20 ENCOUNTER — Telehealth: Payer: Self-pay | Admitting: Neurology

## 2020-05-20 DIAGNOSIS — Z0289 Encounter for other administrative examinations: Secondary | ICD-10-CM

## 2020-05-20 NOTE — Telephone Encounter (Addendum)
Pt's brother(GENERY, GARRETT on D[place]) is asking for a call to discuss the completion of form :[employer].  Phone rep has been advised this [facility] form this not a form kept here at [facility] .  GENERY, GARRETT was called back and informed of that, he said he will try and locate that form.  He states once he has it he will have it brought to the office, take care of the $50.00 fee so that the form can be completed by Dr Epimenio Foot.  This is now FYI, no call back needed by RN

## 2020-05-21 ENCOUNTER — Telehealth: Payer: Self-pay | Admitting: *Deleted

## 2020-05-21 NOTE — Telephone Encounter (Signed)
-----   Message from Asa Lente, MD sent at 05/21/2020  3:56 PM EDT ----- Please let the patient know that the MRI showed old MS lesions but no new ones

## 2020-05-21 NOTE — Telephone Encounter (Signed)
Called and spoke with Gerre Pebbles. Relayed MRI results per Dr. Epimenio Foot note. He verbalized understanding.   He states sister dx with seizures/memory issues about 7 yr ago. Advised we completed [employer] form that he dropped off. He would like form faxed. He will pick up copy of faxed form with fax confirmation Monday, 05/26/20. Advised I will let Stanton Kidney in medical records know this. I gave completed form to her to process.

## 2020-06-23 NOTE — Telephone Encounter (Signed)
I called pt, spoke to pt's brother, Maisie Fus, per [facility]. Pt received her mavenclad shipment and is taking her course with no concerns. Pt's brother will let us know if there are questions or concerns.

## 2020-07-14 ENCOUNTER — Ambulatory Visit: Payer: Medicare Other | Admitting: Podiatry

## 2020-07-15 ENCOUNTER — Ambulatory Visit (INDEPENDENT_AMBULATORY_CARE_PROVIDER_SITE_OTHER): Payer: Medicare Other | Admitting: Podiatry

## 2020-07-15 ENCOUNTER — Other Ambulatory Visit: Payer: Self-pay

## 2020-07-15 DIAGNOSIS — M79675 Pain in left toe(s): Secondary | ICD-10-CM

## 2020-07-15 DIAGNOSIS — H547 Unspecified visual loss: Secondary | ICD-10-CM | POA: Insufficient documentation

## 2020-07-15 DIAGNOSIS — E1165 Type 2 diabetes mellitus with hyperglycemia: Secondary | ICD-10-CM | POA: Insufficient documentation

## 2020-07-15 DIAGNOSIS — R32 Unspecified urinary incontinence: Secondary | ICD-10-CM | POA: Insufficient documentation

## 2020-07-15 DIAGNOSIS — B351 Tinea unguium: Secondary | ICD-10-CM

## 2020-07-15 DIAGNOSIS — M79674 Pain in right toe(s): Secondary | ICD-10-CM

## 2020-07-15 DIAGNOSIS — K8020 Calculus of gallbladder without cholecystitis without obstruction: Secondary | ICD-10-CM | POA: Insufficient documentation

## 2020-07-15 DIAGNOSIS — E7800 Pure hypercholesterolemia, unspecified: Secondary | ICD-10-CM | POA: Insufficient documentation

## 2020-07-15 DIAGNOSIS — G8929 Other chronic pain: Secondary | ICD-10-CM | POA: Insufficient documentation

## 2020-07-15 DIAGNOSIS — M24549 Contracture, unspecified hand: Secondary | ICD-10-CM | POA: Insufficient documentation

## 2020-07-15 DIAGNOSIS — F3342 Major depressive disorder, recurrent, in full remission: Secondary | ICD-10-CM | POA: Insufficient documentation

## 2020-07-17 NOTE — Progress Notes (Signed)
Subjective:   Patient ID: Brandy Mckinney, female   DOB: 54 y.o.   MRN: [id number]   HPI 54 year old female presents the office today requesting anoscopy tenderness.  Thickened elongated she cannot do them herself no causing discomfort.  She is wheelchair-bound due to MS.  She states that her a try to cut her toenails and "it hurt".  Previously had ingrown toenail drainage but no current symptoms.   Review of Systems  All other systems reviewed and are negative.  Past Medical History:  Diagnosis Date   Early onset Alzheimer's dementia (HCC)    Hypercholesteremia    Hyperlipidemia    Hypertension    Lumbosacral pain    Multiple sclerosis (HCC)    Seizure (HCC)    Vitamin D deficiency     Past Surgical History:  Procedure Laterality Date   ABDOMINAL HYSTERECTOMY     CESAREAN SECTION     OOPHORECTOMY       Current Outpatient Medications:    Ascorbic Acid (VITAMIN C) 100 MG tablet, Take by mouth., Disp: , Rfl:    calcium citrate-vitamin D (CITRACAL+D) 315-200 MG-UNIT tablet, Take by mouth., Disp: , Rfl:    Cladribine, 8 Tabs, (MAVENCLAD, 8 TABS,) 10 MG TBPK, Take by mouth. Month 1: Take 2 tablets daily on days 1-3, 1 tablet on day 4 and 5.  Month 2: Take 2 tablets daily on days 1-2, 1 tablet on days 3-5., Disp: , Rfl:    cyclobenzaprine (FLEXERIL) 10 MG tablet, Take 10 mg by mouth 3 (three) times daily., Disp: , Rfl:    etodolac (LODINE) 400 MG tablet, TAKE 1 TABLET (400 MG TOTAL) BY MOUTH 2 (TWO) TIMES DAILY AS NEEDED., Disp: 60 tablet, Rfl: 5   FLUoxetine (PROZAC) 20 MG capsule, Take by mouth., Disp: , Rfl:    gabapentin (NEURONTIN) 300 MG capsule, TAKE 1 CAPSULE BY MOUTH 3 TIMES DAILY, Disp: , Rfl:    Inulin (FIBER CHOICE PO), Take by mouth., Disp: , Rfl:    levETIRAcetam (KEPPRA) 750 MG tablet, Take 750 mg by mouth 2 (two) times daily., Disp: , Rfl:    lisinopril (ZESTRIL) 20 MG tablet, Take by mouth., Disp: , Rfl:    MAVENCLAD, 7 TABS, 10 MG TBPK,  Take by mouth., Disp: , Rfl:    Multiple Vitamins-Minerals (MULTIVITAMIN ADULT PO), Take by mouth., Disp: , Rfl:    omeprazole (PRILOSEC) 40 MG capsule, Take 40 mg by mouth daily., Disp: , Rfl:    simvastatin (ZOCOR) 20 MG tablet, Take by mouth., Disp: , Rfl:    VITAMIN D PO, Take by mouth., Disp: , Rfl:   Allergies  Allergen Reactions   Fentanyl     Other reaction(s): can not have anything on her skin         Objective:  Physical Exam  General: AAO x3, NAD  Dermatological: Nails are hypertrophic, dystrophic, brittle, discolored, elongated 10. No surrounding redness or drainage. Tenderness nails 1-5 bilaterally. No open lesions or pre-ulcerative lesions are identified today.  Incurvation present to the hallux nails without any drainage or pus or signs of infection today.  Vascular: Dorsalis Pedis artery and Posterior Tibial artery pedal pulses are 2/4 bilateral with immedate capillary fill time.  Venous insufficiency noted.  There is no pain with calf compression, swelling, warmth, erythema.   Neruologic: Grossly intact via light touch bilateral.  Musculoskeletal: No gross boney pedal deformities bilateral.      Assessment:   Symptomatic onychomycosis; history of ingrown toenail  Plan:  -Treatment options discussed including all alternatives, risks, and complications -Etiology of symptoms were discussed -Nails debrided 10 without complications or bleeding. -Monitoring signs or symptoms of infection from the toenails. -Daily foot inspection -Follow-up in 3 months or sooner if any problems arise. In the meantime, encouraged to call the office with any questions, concerns, change in symptoms.   Ovid Curd, DPM

## 2020-08-18 ENCOUNTER — Telehealth: Payer: Self-pay | Admitting: *Deleted

## 2020-08-18 NOTE — Telephone Encounter (Signed)
Called and spoke with brother, Brandy Mckinney (on [place]). Advised we received fax from [facility] that pt indicated she is no longer taking Mavenclad as prescribed. According to their records, it was last dispensed on 07/15/20. Brandy Mckinney states this was incorrect. Finished second cycle last month. Took as prescribed. Reminded him that next follow up is 10/14/20 at 3:30pm. He verbalized understanding and appreciation.

## 2020-09-28 ENCOUNTER — Other Ambulatory Visit: Payer: Self-pay | Admitting: Neurology

## 2020-10-14 ENCOUNTER — Ambulatory Visit (INDEPENDENT_AMBULATORY_CARE_PROVIDER_SITE_OTHER): Payer: Medicare Other | Admitting: Neurology

## 2020-10-14 ENCOUNTER — Encounter: Payer: Self-pay | Admitting: Neurology

## 2020-10-14 VITALS — BP 110/80 | HR 80 | Ht 62.0 in

## 2020-10-14 DIAGNOSIS — R32 Unspecified urinary incontinence: Secondary | ICD-10-CM | POA: Diagnosis not present

## 2020-10-14 DIAGNOSIS — F0280 Dementia in other diseases classified elsewhere without behavioral disturbance: Secondary | ICD-10-CM

## 2020-10-14 DIAGNOSIS — R569 Unspecified convulsions: Secondary | ICD-10-CM | POA: Diagnosis not present

## 2020-10-14 DIAGNOSIS — Z993 Dependence on wheelchair: Secondary | ICD-10-CM

## 2020-10-14 DIAGNOSIS — Z79899 Other long term (current) drug therapy: Secondary | ICD-10-CM

## 2020-10-14 DIAGNOSIS — G35 Multiple sclerosis: Secondary | ICD-10-CM | POA: Diagnosis not present

## 2020-10-14 MED ORDER — LEVETIRACETAM 750 MG PO TABS
750.0000 mg | ORAL_TABLET | Freq: Two times a day (BID) | ORAL | 3 refills | Status: DC
Start: 2020-10-14 — End: 2021-10-08

## 2020-10-14 MED ORDER — GABAPENTIN 300 MG PO CAPS: 300.0000 mg | ORAL_CAPSULE | Freq: Three times a day (TID) | ORAL | 3 refills | Status: DC

## 2020-10-14 NOTE — Progress Notes (Signed)
[facility]  PATIENT: Brandy Mckinney DOB: 05/25/1966  REFERRING DOCTOR OR PCP: Salli Real MD SOURCE: Patient, notes from primary care, imaging and lab reports  _________________________________   HISTORICAL  CHIEF COMPLAINT:  Chief Complaint  Patient presents with  . Follow-up    RM 12. Last seen 04/15/2020. On Mavenclad for MS. Needs refill on keppra    HISTORY OF PRESENT ILLNESS:  Brandy Mckinney is a 55 y.o. woman with multiple sclerosis.  Update 10/14/20 She had her first Mavenclad course in December 2020/January 2021.  She had a second year December 2021 in January 2022.  Both times she tolerated the pills well.  She had no side effects.  She denies any change in neurologic function compared to last visit.      She is wheelchair bound and is non-ambulatory.    She does not help significantly with transfers.   She is weaker on her left side than right.  She can feed herself eith the right side.  She cannot turn over in bed.  She is getting home health.   She gets help for 4 hours a day.  Since she is wheelchair bound and brother can't help throughout the whole day, they are wondering if more help is available.   Vision is poor but unchanged.    She has poor bladder control and wears Depends.    Cognition is about with reduced STM, focus/attention.   She often has mild confusion.   She jumbles her words and also has word finding difficulties  She has no recent seizures.   She takes levetiracetam  MS history:  She was diagnosed with MS at age 84 after presenting with optic neuritis in both eyes.  Over the next few years, she began to experience more difficulty with her gait.  This steadily progressed in her 63s 30s and 15s.  For many years, she used a walker.  About 7 or 8 years ago after repeated falls she stopped using the walker and became wheelchair-bound.  At some point, she was started on Avonex.  She remained on this medication for many years.  She  continued to progress and was switched to Tecfidera.  She stayed on that medication for several years and then in 2017 was switched to Ocrevus. Travel was difficult for her to do. In 2020 she switched to [facility]. She had the first course December 2020 in January 2021.   Other neurologic history: She has a history of seizures. They have been well controlled on levetiracetam.  Data: MRI of the brain dated 02/19/2019 [facility]): It shows multiple T2/flair hyperintense foci in the brainstem, cerebellum and in the periventricular, juxtacortical and deep white matter of the hemispheres in a pattern compatible with MS.  There were no enhancing lesions.  I reviewed lab tests from 02/19/2019: CBC was normal.  Lymphocyte count was normal.  She had an mildly elevated ALT but normal AST.  Vitamin D was normal.  Hepatitis Be antigen and hepatitis B core antibody were negative.  Hep C antibody and RNA was negative.  HIV was negative.  QuantiFERON TB was negative.  Varicella-zoster IgG showed immunity.   REVIEW OF SYSTEMS: Constitutional: No fevers, chills, sweats, or change in appetite.  She has some insomnia and irregular sleep. Eyes: No eye pain.  She has reduced visual acuity. Ear, nose and throat: No hearing loss, ear pain, nasal congestion, sore throat Cardiovascular: No chest pain, palpitations Respiratory: No shortness of breath at rest or with  exertion.   No wheezes GastrointestinaI: No nausea, vomiting, diarrhea, abdominal pain, fecal incontinence Genitourinary:She has incontinence  musculoskeletal:She notes myalgias  integumentary: No rash, pruritus, skin lesions Neurological: as above Psychiatric: No depression at this time.  No anxiety Endocrine: No palpitations, diaphoresis, change in appetite, change in weigh or increased thirst Hematologic/Lymphatic: No anemia, purpura, petechiae. Allergic/Immunologic: No itchy/runny eyes, nasal congestion, recent allergic reactions,  rashes  ALLERGIES: Allergies  Allergen Reactions  . Fentanyl     Other reaction(s): can not have anything on her skin    HOME MEDICATIONS:  Current Outpatient Medications:  .  Ascorbic Acid (VITAMIN C) 100 MG tablet, Take by mouth., Disp: , Rfl:  .  calcium citrate-vitamin D (CITRACAL+D) 315-200 MG-UNIT tablet, Take by mouth., Disp: , Rfl:  .  Cladribine, 8 Tabs, (MAVENCLAD, 8 TABS,) 10 MG TBPK, Take by mouth. Month 1: Take 2 tablets daily on days 1-3, 1 tablet on day 4 and 5.  Month 2: Take 2 tablets daily on days 1-2, 1 tablet on days 3-5., Disp: , Rfl:  .  cyclobenzaprine (FLEXERIL) 10 MG tablet, Take 10 mg by mouth 3 (three) times daily., Disp: , Rfl:  .  etodolac (LODINE) 400 MG tablet, TAKE 1 TABLET (400 MG TOTAL) BY MOUTH 2 (TWO) TIMES DAILY AS NEEDED., Disp: 60 tablet, Rfl: 0 .  FLUoxetine (PROZAC) 20 MG capsule, Take by mouth., Disp: , Rfl:  .  Inulin (FIBER CHOICE PO), Take by mouth., Disp: , Rfl:  .  lisinopril (ZESTRIL) 20 MG tablet, Take by mouth., Disp: , Rfl:  .  MAVENCLAD, 7 TABS, 10 MG TBPK, Take by mouth., Disp: , Rfl:  .  Multiple Vitamins-Minerals (MULTIVITAMIN ADULT PO), Take by mouth., Disp: , Rfl:  .  omeprazole (PRILOSEC) 40 MG capsule, Take 40 mg by mouth daily., Disp: , Rfl:  .  simvastatin (ZOCOR) 20 MG tablet, Take by mouth., Disp: , Rfl:  .  VITAMIN D PO, Take by mouth., Disp: , Rfl:  .  gabapentin (NEURONTIN) 300 MG capsule, Take 1 capsule (300 mg total) by mouth 3 (three) times daily., Disp: 270 capsule, Rfl: 3 .  levETIRAcetam (KEPPRA) 750 MG tablet, Take 1 tablet (750 mg total) by mouth 2 (two) times daily., Disp: 180 tablet, Rfl: 3  PAST MEDICAL HISTORY: Past Medical History:  Diagnosis Date  . Early onset Alzheimer's dementia (HCC)   . Hypercholesteremia   . Hyperlipidemia   . Hypertension   . Lumbosacral pain   . Multiple sclerosis (HCC)   . Seizure (HCC)   . Vitamin D deficiency     PAST SURGICAL HISTORY:   FAMILY HISTORY: No family  history on file.  SOCIAL HISTORY:  Social History   Socioeconomic History  . Marital status: Divorced    Spouse name: Not on file  . Number of children: Not on file  . Years of education: Not on file  . Highest education level: Not on file  Occupational History  . Not on file  Tobacco Use  . Smoking status: Former Smoker    Quit date: 2012    Years since quitting: 10.2  . Smokeless tobacco: Never Used  Substance and Sexual Activity  . Alcohol use: Not Currently  . Drug use: Never  . Sexual activity: Not on file  Other Topics Concern  . Not on file  Social History Narrative   Lives at home with brother tommy    Right handed    Social Determinants of Corporate investment banker  Strain: Not on file  Food Insecurity: Not on file  Transportation Needs: Not on file  Physical Activity: Not on file  Stress: Not on file  Social Connections: Not on file  Intimate Partner Violence: Not on file     PHYSICAL EXAM  Vitals:   10/14/20 1512  BP: 110/80  Pulse: 80  SpO2: 95%  Height: 5\' 2"  (1.575 m)    There is no height or weight on file to calculate BMI.   General: The patient is well-developed and well-nourished and in no acute distress  HEENT:  Head is [place]/AT.  Sclera are anicteric.     Musculoskeletal: She has some muscle tenderness on deep palpation.   Neurologic Exam  Mental status: The patient is alert and oriented to name and city but not date at the time of the examination.  She had reduced focus and attention and reduced immediate recall.   Speech is normal.  Cranial nerves: She has bilateral INO.  There is good facial sensation to soft touch bilaterally facial strength and sensation was normal..  Trapezius and sternocleidomastoid strength is normal.  No obvious hearing deficits are noted.  Motor:  Muscle bulk is normal.   Muscle tone is increased, left greater than right.  Strength was 3/5 in the left arm and 2+ to 3/5 in the left leg.  Strength was 4 to  4+/5 in the right arm and 4-/5 in the right leg.  Sensory: Sensory testing is intact to pinprick, soft touch and vibration sensation in all 4 extremities.  Coordination: Cerebellar testing reduced right and poor left finger-nose-finger .   Cannot do HTS  Gait and station: can not stand or walk  Reflexes: Deep tedon reflexes are increased in the legs..         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  She has completed the second year of Mavenclad.  Today, we will check labs.   2.   Continue Keppra 3.   Increase gabapentin to 300 mg po tid as it may help with the dysesthesias better 4.   rtc 4 months or sooner if there are new or worsening neurologic symptoms.  She will need to have CBC with differential and hepatic function test checked at the next visit.  Can hold off on next MRI till early 2023.    Richard A. 2024, MD, [facility] 10/14/2020, 3:57 PM Certified in Neurology, Clinical Neurophysiology, Sleep Medicine and Neuroimaging  [facility] [address] [place], [place] [phone]

## 2020-10-15 ENCOUNTER — Telehealth: Payer: Self-pay | Admitting: *Deleted

## 2020-10-15 LAB — CBC WITH DIFFERENTIAL/PLATELET
Basophils Absolute: 0 10*3/uL (ref 0.0–0.2)
Basos: 0 %
EOS (ABSOLUTE): 0.2 10*3/uL (ref 0.0–0.4)
Eos: 3 %
Hematocrit: 46.3 % (ref 34.0–46.6)
Hemoglobin: 15.1 g/dL (ref 11.1–15.9)
Immature Grans (Abs): 0 10*3/uL (ref 0.0–0.1)
Immature Granulocytes: 1 %
Lymphocytes Absolute: 0.8 10*3/uL (ref 0.7–3.1)
Lymphs: 11 %
MCH: 29.7 pg (ref 26.6–33.0)
MCHC: 32.6 g/dL (ref 31.5–35.7)
MCV: 91 fL (ref 79–97)
Monocytes Absolute: 0.4 10*3/uL (ref 0.1–0.9)
Monocytes: 6 %
Neutrophils Absolute: 5.8 10*3/uL (ref 1.4–7.0)
Neutrophils: 79 %
Platelets: 248 10*3/uL (ref 150–450)
RBC: 5.08 x10E6/uL (ref 3.77–5.28)
RDW: 13.4 % (ref 11.7–15.4)
WBC: 7.2 10*3/uL (ref 3.4–10.8)

## 2020-10-15 LAB — HEPATIC FUNCTION PANEL
ALT: 50 IU/L — ABNORMAL HIGH (ref 0–32)
AST: 27 IU/L (ref 0–40)
Albumin: 3.7 g/dL — ABNORMAL LOW (ref 3.8–4.9)
Alkaline Phosphatase: 135 IU/L — ABNORMAL HIGH (ref 44–121)
Bilirubin Total: 0.6 mg/dL (ref 0.0–1.2)
Bilirubin, Direct: 0.2 mg/dL (ref 0.00–0.40)
Total Protein: 6 g/dL (ref 6.0–8.5)

## 2020-10-15 NOTE — Telephone Encounter (Signed)
Called and spoke with brother (on [place]) about lab results per Dr. Epimenio Foot note. He verbalized understanding.

## 2020-10-15 NOTE — Telephone Encounter (Signed)
-----   Message from Asa Lente, MD sent at 10/15/2020  8:50 AM EDT ----- Please let the patient know that the lab work is fine.

## 2020-10-21 ENCOUNTER — Ambulatory Visit: Payer: Medicare Other | Admitting: Podiatry

## 2020-10-26 ENCOUNTER — Other Ambulatory Visit: Payer: Self-pay | Admitting: Neurology

## 2020-11-25 ENCOUNTER — Other Ambulatory Visit: Payer: Self-pay | Admitting: Neurology

## 2020-12-30 ENCOUNTER — Other Ambulatory Visit: Payer: Self-pay | Admitting: Family Medicine

## 2020-12-30 DIAGNOSIS — Z1231 Encounter for screening mammogram for malignant neoplasm of breast: Secondary | ICD-10-CM

## 2021-02-02 ENCOUNTER — Other Ambulatory Visit: Payer: Self-pay

## 2021-02-02 ENCOUNTER — Encounter: Payer: Self-pay | Admitting: Podiatry

## 2021-02-02 ENCOUNTER — Ambulatory Visit (INDEPENDENT_AMBULATORY_CARE_PROVIDER_SITE_OTHER): Payer: Medicare Other | Admitting: Podiatry

## 2021-02-02 DIAGNOSIS — B351 Tinea unguium: Secondary | ICD-10-CM

## 2021-02-02 DIAGNOSIS — M79674 Pain in right toe(s): Secondary | ICD-10-CM

## 2021-02-02 DIAGNOSIS — M79675 Pain in left toe(s): Secondary | ICD-10-CM

## 2021-02-05 NOTE — Progress Notes (Signed)
Subjective: Brandy Mckinney is a pleasant 55 y.o. female patient seen today painful thick toenails that are difficult to trim. Pain interferes with ambulation. Aggravating factors include wearing enclosed shoe gear. Pain is relieved with periodic professional debridement.  Allergies  Allergen Reactions   Fentanyl     Other reaction(s): can not have anything on her skin    Objective: Physical Exam  General: Brandy Mckinney is a pleasant 55 y.o. Caucasian female, in NAD. AAO x 3.   Vascular:  Capillary refill time to digits immediate b/l. Palpable pedal pulses b/l LE. Lower extremity skin temperature gradient within normal limits. No pain with calf compression b/l. Evidence of chronic venous insufficiency b/l lower extremities.  Dermatological:  Pedal skin with normal turgor, texture and tone b/l lower extremities No open wounds b/l lower extremities No interdigital macerations b/l lower extremities Toenails 1-5 b/l elongated, discolored, dystrophic, thickened, crumbly with subungual debris and tenderness to dorsal palpation.  Musculoskeletal:  Normal muscle strength 5/5 to all lower extremity muscle groups bilaterally. No gross bony deformities bilaterally.  Neurological:  Protective sensation intact 5/5 intact bilaterally with 10g monofilament b/l.  Assessment and Plan:  1. Pain due to onychomycosis of toenails of both feet     -Examined patient. -Patient to continue soft, supportive shoe gear daily. -Toenails 1-5 b/l were debrided in length and girth with sterile nail nippers and dremel without iatrogenic bleeding.  -Patient to report any pedal injuries to medical professional immediately. -Patient/[name] to call should there be question/concern in the interim.  Return in about 3 months (around 05/05/2021).  Freddie Breech, DPM

## 2021-02-23 ENCOUNTER — Other Ambulatory Visit: Payer: Self-pay

## 2021-02-23 ENCOUNTER — Ambulatory Visit
Admission: RE | Admit: 2021-02-23 | Discharge: 2021-02-23 | Disposition: A | Discharge status: Home / Self Care | Payer: Medicare Other | Source: Ambulatory Visit | Attending: Family Medicine | Admitting: Family Medicine

## 2021-02-23 DIAGNOSIS — Z1231 Encounter for screening mammogram for malignant neoplasm of breast: Secondary | ICD-10-CM

## 2021-04-14 ENCOUNTER — Encounter: Payer: Self-pay | Admitting: Neurology

## 2021-04-14 ENCOUNTER — Ambulatory Visit (INDEPENDENT_AMBULATORY_CARE_PROVIDER_SITE_OTHER): Payer: Medicare Other | Admitting: Neurology

## 2021-04-14 VITALS — BP 108/68 | HR 77 | Ht 62.0 in

## 2021-04-14 DIAGNOSIS — Z79899 Other long term (current) drug therapy: Secondary | ICD-10-CM | POA: Diagnosis not present

## 2021-04-14 DIAGNOSIS — F0280 Dementia in other diseases classified elsewhere without behavioral disturbance: Secondary | ICD-10-CM | POA: Diagnosis not present

## 2021-04-14 DIAGNOSIS — R32 Unspecified urinary incontinence: Secondary | ICD-10-CM

## 2021-04-14 DIAGNOSIS — E559 Vitamin D deficiency, unspecified: Secondary | ICD-10-CM

## 2021-04-14 DIAGNOSIS — Z993 Dependence on wheelchair: Secondary | ICD-10-CM

## 2021-04-14 DIAGNOSIS — G35 Multiple sclerosis: Secondary | ICD-10-CM | POA: Diagnosis not present

## 2021-04-14 DIAGNOSIS — R569 Unspecified convulsions: Secondary | ICD-10-CM

## 2021-04-14 NOTE — Progress Notes (Signed)
[facility]  PATIENT: Brandy Mckinney DOB: 10/06/1965  REFERRING DOCTOR OR PCP: Salli Real MD SOURCE: Patient, notes from primary care, imaging and lab reports  _________________________________   HISTORICAL  CHIEF COMPLAINT:  Chief Complaint  Patient presents with   Follow-up    RM 2. MS/seizure follow up. In wheelchair in office today.  No falls, no new sx.     HISTORY OF PRESENT ILLNESS:  Brandy Mckinney is a 55 y.o. woman with multiple sclerosis.  Update 10/14/20 She had her first Mavenclad course in December 2020/January 2021.  She had a second year December 2021 in January 2022.  Both times she tolerated the pills well.  She had no side effects.    CBC/D 10/14/2020 showed normal hematology labs  (lymphocytes 0.8).   ALT was mildly elevated = 50  She denies any change in neurologic function compared to last year.   She is wheelchair bound.  She is non-ambulatory.    She does not help significantly with transfers.   She is weaker on her left side than right.  She can feed herself eith the right side.  She cannot turn over in bed.  She is getting home health.   She gets help for 4 hours a day.  Since she is wheelchair bound and brother can't help throughout the whole day, they are wondering if more help is available.   Vision is poor but unchanged.    She has incontinence and wears Depends.    Gabapentin has helped her dysesthesias.    Cognition is about with reduced STM, focus/attention.   She often has mild confusion.   She jumbles her words and also has word finding difficulties  She has no recent seizures.   She takes levetiracetam and tolerates it well   She has had her Covid vaccinations and boosters.  MS history:  She was diagnosed with MS at age 82 after presenting with optic neuritis in both eyes.  Over the next few years, she began to experience more difficulty with her gait.  This steadily progressed in her 32s 30s and 46s.  For many years,  she used a walker.  About 7 or 8 years ago after repeated falls she stopped using the walker and became wheelchair-bound.  At some point, she was started on Avonex.  She remained on this medication for many years.  She continued to progress and was switched to Tecfidera.  She stayed on that medication for several years and then in 2017 was switched to Ocrevus. Travel was difficult for her to do. In 2020 she switched to [facility]. She had the first course December 2020 in January 2021.   Other neurologic history: She has a history of seizures. They have been well controlled on levetiracetam.  Data: MRI of the brain dated 02/19/2019 [facility]): It shows multiple T2/flair hyperintense foci in the brainstem, cerebellum and in the periventricular, juxtacortical and deep white matter of the hemispheres in a pattern compatible with MS.  There were no enhancing lesions.  I reviewed lab tests from 02/19/2019: CBC was normal.  Lymphocyte count was normal.  She had an mildly elevated ALT but normal AST.  Vitamin D was normal.  Hepatitis Be antigen and hepatitis B core antibody were negative.  Hep C antibody and RNA was negative.  HIV was negative.  QuantiFERON TB was negative.  Varicella-zoster IgG showed immunity.   REVIEW OF SYSTEMS: Constitutional: No fevers, chills, sweats, or change in appetite.  She has  some insomnia and irregular sleep. Eyes: No eye pain.  She has reduced visual acuity. Ear, nose and throat: No hearing loss, ear pain, nasal congestion, sore throat Cardiovascular: No chest pain, palpitations Respiratory:  No shortness of breath at rest or with exertion.   No wheezes GastrointestinaI: No nausea, vomiting, diarrhea, abdominal pain, fecal incontinence Genitourinary: She has incontinence  musculoskeletal: She notes myalgias  integumentary: No rash, pruritus, skin lesions Neurological: as above Psychiatric: No depression at this time.  No anxiety Endocrine: No  palpitations, diaphoresis, change in appetite, change in weigh or increased thirst Hematologic/Lymphatic:  No anemia, purpura, petechiae. Allergic/Immunologic: No itchy/runny eyes, nasal congestion, recent allergic reactions, rashes  ALLERGIES: Allergies  Allergen Reactions   Fentanyl     Other reaction(s): can not have anything on her skin    HOME MEDICATIONS:  Current Outpatient Medications:    Ascorbic Acid (VITAMIN C) 100 MG tablet, Take by mouth., Disp: , Rfl:    calcium citrate-vitamin D (CITRACAL+D) 315-200 MG-UNIT tablet, Take by mouth., Disp: , Rfl:    Cladribine, 8 Tabs, (MAVENCLAD, 8 TABS,) 10 MG TBPK, Take by mouth. Month 1: Take 2 tablets daily on days 1-3, 1 tablet on day 4 and 5.  Month 2: Take 2 tablets daily on days 1-2, 1 tablet on days 3-5., Disp: , Rfl:    cyclobenzaprine (FLEXERIL) 10 MG tablet, Take 10 mg by mouth 3 (three) times daily., Disp: , Rfl:    etodolac (LODINE) 400 MG tablet, TAKE 1 TABLET BY MOUTH TWICE A DAY AS NEEDED, Disp: 60 tablet, Rfl: 5   fluconazole (DIFLUCAN) 150 MG tablet, Take 150 mg by mouth once a week., Disp: , Rfl:    FLUoxetine (PROZAC) 20 MG capsule, Take by mouth., Disp: , Rfl:    gabapentin (NEURONTIN) 300 MG capsule, Take 1 capsule (300 mg total) by mouth 3 (three) times daily., Disp: 270 capsule, Rfl: 3   Inulin (FIBER CHOICE PO), Take by mouth., Disp: , Rfl:    levETIRAcetam (KEPPRA) 750 MG tablet, Take 1 tablet (750 mg total) by mouth 2 (two) times daily., Disp: 180 tablet, Rfl: 3   lisinopril (ZESTRIL) 20 MG tablet, Take by mouth., Disp: , Rfl:    Multiple Vitamins-Minerals (MULTIVITAMIN ADULT PO), Take by mouth., Disp: , Rfl:    omeprazole (PRILOSEC) 20 MG capsule, Take 20 mg by mouth every morning., Disp: , Rfl:    omeprazole (PRILOSEC) 40 MG capsule, Take 40 mg by mouth daily., Disp: , Rfl:    simvastatin (ZOCOR) 20 MG tablet, Take by mouth., Disp: , Rfl:    VITAMIN D PO, Take by mouth., Disp: , Rfl:   PAST MEDICAL  HISTORY: Past Medical History:  Diagnosis Date   Early onset Alzheimer's dementia (HCC)    Hypercholesteremia    Hyperlipidemia    Hypertension    Lumbosacral pain    Multiple sclerosis (HCC)    Seizure (HCC)    Vitamin D deficiency     PAST SURGICAL HISTORY:   FAMILY HISTORY: No family history on file.  SOCIAL HISTORY:  Social History   Socioeconomic History   Marital status: Divorced    Spouse name: Not on file   Number of children: Not on file   Years of education: Not on file   Highest education level: Not on file  Occupational History   Not on file  Tobacco Use   Smoking status: Former    Types: Cigarettes    Quit date: 2012    Years since  quitting: 10.7   Smokeless tobacco: Never  Substance and Sexual Activity   Alcohol use: Not Currently   Drug use: Never   Sexual activity: Not on file  Other Topics Concern   Not on file  Social History Narrative   Lives at home with brother tommy    Right handed    Social Determinants of Health   Financial Resource Strain: Not on file  Food Insecurity: Not on file  Transportation Needs: Not on file  Physical Activity: Not on file  Stress: Not on file  Social Connections: Not on file  Intimate Partner Violence: Not on file     PHYSICAL EXAM  Vitals:   04/14/21 1455  BP: 108/68  Pulse: 77  SpO2: 96%  Height: 5\' 2"  (1.575 m)    There is no height or weight on file to calculate BMI.   General: The patient is well-developed and well-nourished and in no acute distress  HEENT:  Head is [place]/AT.  Sclera are anicteric.     Musculoskeletal: She has some muscle tenderness on deep palpation.   Neurologic Exam  Mental status: The patient is alert and oriented to name and city but not date at the time of the examination.  She had reduced focus and attention and reduced immediate recall.   Speech is normal.  Cranial nerves: She has L > R  INO.  There is good facial sensation to soft touch bilaterally facial  strength and sensation was normal..  Trapezius and sternocleidomastoid strength is normal.  No obvious hearing deficits are noted.  Motor:  Muscle bulk is normal.   Muscle tone is increased, left greater than right.  Strength was 3/5 in the left arm and 2+ to 3/5 in the left leg.  Strength was 4 to 4+/5 in the right arm and 4-/5 in the right leg.  Sensory: Sensory testing is intact to pinprick, soft touch and vibration sensation in all 4 extremities.  Coordination: Cerebellar testing reduced right and poor left finger-nose-finger .   Cannot do HTS either side  Gait and station: unable to stand or walk  Reflexes: Deep tedon reflexes are increased in the legs..         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  She has completed the second year of Mavenclad.   check labs. 2.   Continue Keppra 3.   Continue gabapentin  300 mg po tid as it may help with the dysesthesias better 4.   rtc 12  months or sooner if there are new or worsening neurologic symptoms.  She will need to have CBC with differential and hepatic function test checked at the next visit.  Can hold off on next MRI till early 2023.    Pepper Wyndham A. 2024, MD, [facility] Foot 04/14/2021, 3:24 PM Certified in Neurology, Clinical Neurophysiology, Sleep Medicine and Neuroimaging  [facility] [address] [place], [place] [phone]

## 2021-04-15 LAB — CBC WITH DIFFERENTIAL/PLATELET
Basophils Absolute: 0 10*3/uL (ref 0.0–0.2)
Basos: 0 %
EOS (ABSOLUTE): 0.3 10*3/uL (ref 0.0–0.4)
Eos: 4 %
Hematocrit: 47.7 % — ABNORMAL HIGH (ref 34.0–46.6)
Hemoglobin: 15.1 g/dL (ref 11.1–15.9)
Immature Grans (Abs): 0.1 10*3/uL (ref 0.0–0.1)
Immature Granulocytes: 1 %
Lymphocytes Absolute: 1 10*3/uL (ref 0.7–3.1)
Lymphs: 14 %
MCH: 29.1 pg (ref 26.6–33.0)
MCHC: 31.7 g/dL (ref 31.5–35.7)
MCV: 92 fL (ref 79–97)
Monocytes Absolute: 0.4 10*3/uL (ref 0.1–0.9)
Monocytes: 5 %
Neutrophils Absolute: 5.8 10*3/uL (ref 1.4–7.0)
Neutrophils: 76 %
Platelets: 198 10*3/uL (ref 150–450)
RBC: 5.19 x10E6/uL (ref 3.77–5.28)
RDW: 14.1 % (ref 11.7–15.4)
WBC: 7.5 10*3/uL (ref 3.4–10.8)

## 2021-04-15 LAB — HEPATIC FUNCTION PANEL
ALT: 38 IU/L — ABNORMAL HIGH (ref 0–32)
AST: 22 IU/L (ref 0–40)
Albumin: 4.2 g/dL (ref 3.8–4.9)
Alkaline Phosphatase: 155 IU/L — ABNORMAL HIGH (ref 44–121)
Bilirubin Total: 0.7 mg/dL (ref 0.0–1.2)
Bilirubin, Direct: 0.24 mg/dL (ref 0.00–0.40)
Total Protein: 6.4 g/dL (ref 6.0–8.5)

## 2021-04-15 LAB — VITAMIN D 25 HYDROXY (VIT D DEFICIENCY, FRACTURES): Vit D, 25-Hydroxy: 34.5 ng/mL (ref 30.0–100.0)

## 2021-05-11 ENCOUNTER — Other Ambulatory Visit: Payer: Self-pay

## 2021-05-11 ENCOUNTER — Ambulatory Visit (INDEPENDENT_AMBULATORY_CARE_PROVIDER_SITE_OTHER): Payer: Medicare Other | Admitting: Podiatry

## 2021-05-11 DIAGNOSIS — M79674 Pain in right toe(s): Secondary | ICD-10-CM

## 2021-05-11 DIAGNOSIS — B351 Tinea unguium: Secondary | ICD-10-CM

## 2021-05-11 DIAGNOSIS — M79675 Pain in left toe(s): Secondary | ICD-10-CM

## 2021-05-27 ENCOUNTER — Other Ambulatory Visit: Payer: Self-pay | Admitting: Neurology

## 2021-06-07 NOTE — Progress Notes (Signed)
Patient left without being seen.

## 2021-06-15 ENCOUNTER — Emergency Department (HOSPITAL_COMMUNITY): Payer: Medicare Other

## 2021-06-15 ENCOUNTER — Other Ambulatory Visit: Payer: Self-pay

## 2021-06-15 ENCOUNTER — Encounter (HOSPITAL_COMMUNITY): Payer: Self-pay

## 2021-06-15 ENCOUNTER — Observation Stay (HOSPITAL_COMMUNITY)
Admission: EM | Admit: 2021-06-15 | Discharge: 2021-06-17 | Disposition: A | Discharge status: Home / Self Care | Payer: Medicare Other | Attending: Internal Medicine | Admitting: Internal Medicine

## 2021-06-15 DIAGNOSIS — Z87891 Personal history of nicotine dependence: Secondary | ICD-10-CM | POA: Insufficient documentation

## 2021-06-15 DIAGNOSIS — Z79899 Other long term (current) drug therapy: Secondary | ICD-10-CM | POA: Diagnosis not present

## 2021-06-15 DIAGNOSIS — I10 Essential (primary) hypertension: Secondary | ICD-10-CM | POA: Diagnosis not present

## 2021-06-15 DIAGNOSIS — G309 Alzheimer's disease, unspecified: Secondary | ICD-10-CM | POA: Insufficient documentation

## 2021-06-15 DIAGNOSIS — K810 Acute cholecystitis: Secondary | ICD-10-CM | POA: Diagnosis not present

## 2021-06-15 DIAGNOSIS — K8020 Calculus of gallbladder without cholecystitis without obstruction: Secondary | ICD-10-CM

## 2021-06-15 DIAGNOSIS — F0280 Dementia in other diseases classified elsewhere without behavioral disturbance: Secondary | ICD-10-CM | POA: Insufficient documentation

## 2021-06-15 DIAGNOSIS — R0789 Other chest pain: Principal | ICD-10-CM | POA: Insufficient documentation

## 2021-06-15 DIAGNOSIS — R7989 Other specified abnormal findings of blood chemistry: Secondary | ICD-10-CM

## 2021-06-15 DIAGNOSIS — G35 Multiple sclerosis: Secondary | ICD-10-CM | POA: Diagnosis present

## 2021-06-15 DIAGNOSIS — E7800 Pure hypercholesterolemia, unspecified: Secondary | ICD-10-CM | POA: Diagnosis present

## 2021-06-15 DIAGNOSIS — R079 Chest pain, unspecified: Secondary | ICD-10-CM

## 2021-06-15 DIAGNOSIS — Z20822 Contact with and (suspected) exposure to covid-19: Secondary | ICD-10-CM | POA: Insufficient documentation

## 2021-06-15 DIAGNOSIS — R1011 Right upper quadrant pain: Secondary | ICD-10-CM

## 2021-06-15 LAB — BASIC METABOLIC PANEL
Anion gap: 13 (ref 5–15)
BUN: 6 mg/dL (ref 6–20)
CO2: 29 mmol/L (ref 22–32)
Calcium: 9.1 mg/dL (ref 8.9–10.3)
Chloride: 99 mmol/L (ref 98–111)
Creatinine, Ser: 0.51 mg/dL (ref 0.44–1.00)
GFR, Estimated: >60 mL/min (ref >60)
Glucose, Bld: 110 mg/dL — ABNORMAL HIGH (ref 70–99)
Potassium: 3.6 mmol/L (ref 3.5–5.1)
Sodium: 141 mmol/L (ref 135–145)

## 2021-06-15 LAB — CBC
HCT: 49.2 % — ABNORMAL HIGH (ref 36.0–46.0)
Hemoglobin: 15.4 g/dL — ABNORMAL HIGH (ref 12.0–15.0)
MCH: 29.1 pg (ref 26.0–34.0)
MCHC: 31.3 g/dL (ref 30.0–36.0)
MCV: 93 fL (ref 80.0–100.0)
Platelets: 201 10*3/uL (ref 150–400)
RBC: 5.29 MIL/uL — ABNORMAL HIGH (ref 3.87–5.11)
RDW: 14.1 % (ref 11.5–15.5)
WBC: 7.5 10*3/uL (ref 4.0–10.5)
nRBC: 0 % (ref 0.0–0.2)

## 2021-06-15 LAB — HEPATIC FUNCTION PANEL
ALT: 33 U/L (ref 0–44)
AST: 23 U/L (ref 15–41)
Albumin: 3.4 g/dL — ABNORMAL LOW (ref 3.5–5.0)
Alkaline Phosphatase: 107 U/L (ref 38–126)
Bilirubin, Direct: 0.3 mg/dL — ABNORMAL HIGH (ref 0.0–0.2)
Indirect Bilirubin: 1.5 mg/dL — ABNORMAL HIGH (ref 0.3–0.9)
Total Bilirubin: 1.8 mg/dL — ABNORMAL HIGH (ref 0.3–1.2)
Total Protein: 6.4 g/dL — ABNORMAL LOW (ref 6.5–8.1)

## 2021-06-15 LAB — TROPONIN I (HIGH SENSITIVITY)
Troponin I (High Sensitivity): 4 ng/L (ref <18)
Troponin I (High Sensitivity): 5 ng/L (ref <18)

## 2021-06-15 LAB — D-DIMER, QUANTITATIVE: D-Dimer, Quant: 1.52 ug/mL-FEU — ABNORMAL HIGH (ref 0.00–0.50)

## 2021-06-15 MED ORDER — HYDROMORPHONE HCL 1 MG/ML IJ SOLN: 1.0000 mg | Freq: Once | INTRAMUSCULAR | Status: DC

## 2021-06-15 MED ORDER — MORPHINE SULFATE (PF) 4 MG/ML IV SOLN
4.0000 mg | Freq: Once | INTRAVENOUS | Status: AC
  Administered 2021-06-15: 4 mg via INTRAVENOUS
  Filled 2021-06-15: qty 1

## 2021-06-15 MED ORDER — LACTATED RINGERS IV BOLUS
1000.0000 mL | Freq: Once | INTRAVENOUS | Status: AC
  Administered 2021-06-15: 1000 mL via INTRAVENOUS

## 2021-06-15 MED ORDER — IOHEXOL 350 MG/ML SOLN
70.0000 mL | Freq: Once | INTRAVENOUS | Status: AC | PRN
  Administered 2021-06-15: 70 mL via INTRAVENOUS

## 2021-06-15 NOTE — ED Provider Notes (Addendum)
  Physical Exam  BP (!) 134/109   Pulse 67   Temp 97.9 F (36.6 C) (Oral)   Resp 16   Ht 5\' 1"  (1.549 m)   Wt 81.6 kg   SpO2 91%   BMI 34.01 kg/m     ED Course/Procedures   Clinical Course as of 06/15/21 1908  Mon Jun 15, 2021  1428 Spoke with patients brother, who noted that the patient began to have right sided chest pain today. Pt was given 2 TUMS by brother prior to EMS arrival. [SB]  1438 Case discussed with Attending, Attending in to evaluate patient.  [SB]  1621 Patient re-evaluated, resting comfortably on stretcher. No pain at this time. Discussed findings with patient and sibling at bedside.  [SB]  1748 Discussed with patient and sibling troponin results.  At this time patient endorses right-sided chest pain.  Will order D-dimer due to patient being in bed-bound due to her MS. [SB]  1753 Discussed new treatment plan with patient and her sibling. Answered questions.  [SB]    Clinical Course User Index [SB] Blue, Soijett A, PA-C    Procedures  MDM    22F presenting with right sided CP today, negative delta troponins.  Still complaining of persistent right-sided chest pain, D-dimer pending.  D-dimer resulted positive.  CTA PE study ordered to evaluate for PE.   2100 She does endorse right upper quadrant pain with some radiation up to her right shoulder.  She is tender to palpation of the right upper quadrant on exam with a positive Murphy sign.  Will order hepatic function panel and right upper quadrant ultrasound.  2200 Patient's right upper quadrant ultrasound resulted positive for cholelithiasis with a positive sonographic Murphy sign concerning for acute cholecystitis.  HIDA scan was recommended.  Hepatic function panel was ordered but not yet resulted.  I spoke with on-call general surgery, Dr. [id number] who recommended admission for HIDA scan.  Hospitalist medicine consulted for admission for HIDA scan and pain control.  Plan to follow-up hepatic function panel.   The patient does not have a leukocytosis and is not septic appearing.  Overall stable for floor admission.  Hepatic function panel reviewed, normal FTs, alkaline phosphatase, elevated total bilirubin and direct bilirubin.  Ultrasound with cholelithiasis with a positive sonographic Murphy sign but no gallbladder wall thickening or pericholecystic fluid.  No leukocytosis.  Given equivocal ultrasound results and labs, patient will be admitted for HIDA scan and pain control with surgical consultation inpatient.    Sheliah Hatch, MD 06/15/21 06/17/21    3500, MD 06/15/21 [phone]

## 2021-06-15 NOTE — ED Notes (Signed)
Pt brother stated pt usually take seizure medication twice a day. He is unsure of the name. Notified attending

## 2021-06-15 NOTE — ED Provider Notes (Signed)
[facility] EMERGENCY DEPARTMENT Provider Note   CSN: [id number] Arrival date & time: 06/15/21  1346     History Chief Complaint  Patient presents with   Chest Pain    Brandy Mckinney is a 55 y.o. female with a PMHx of MS, HTN, HLD,  presenting to the ED complaining of intermittent right sided chest pain onset yesterday. She describes her chest pain as sharp and rates it 12/10. No active chest pain currently. Pt was given 2 TUMS prior to arrival for her symptoms. She was given ASA by EMS. Denies history of MI. Denies shortness of breath, fever, chills, abdominal pain, nausea, vomiting. She reports previous history of similar right sided chest pain. Denies heartburn currently.    The history is provided by the patient and a relative. No language interpreter was used.  Chest Pain Pain location:  R chest Pain quality: sharp   Pain radiates to:  R shoulder Onset quality:  Sudden Timing:  Intermittent Context: at rest   Associated symptoms: no abdominal pain, no cough, no dizziness, no fever, no nausea, no shortness of breath and no vomiting   Risk factors: high cholesterol and hypertension   Risk factors: no aortic disease, no coronary artery disease, no diabetes mellitus and no smoking    HPI: A 55 year old patient with a history of hypertension, hypercholesterolemia and obesity presents for evaluation of chest pain. Initial onset of pain was approximately 1-3 hours ago. The patient's chest pain is sharp and is not worse with exertion. The patient's chest pain is not middle- or left-sided, is not well-localized, is not described as heaviness/pressure/tightness and does not radiate to the arms/jaw/neck. The patient does not complain of nausea and denies diaphoresis. The patient has no history of stroke, has no history of peripheral artery disease, has not smoked in the past 90 days, denies any history of treated diabetes and has no relevant family history of coronary  artery disease (first degree relative at less than age 53).   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/15/2020   Decreased vision 07/15/2020   Hyperglycemia due to type 2 diabetes mellitus (HCC) 07/15/2020   Pure hypercholesterolemia 07/15/2020   Recurrent major depression in full remission (HCC) 07/15/2020   Urinary incontinence 07/15/2020   High risk medication use 06/12/2019   Wheelchair bound 12/19/2017   Seizure (HCC) 07/16/2017   Dementia associated with other underlying disease without behavioral disturbance (HCC) 11/14/2015   Fall 07/08/2011   Multiple sclerosis (HCC) 12/12/2006    Past Surgical History:  Procedure Laterality Date   ABDOMINAL HYSTERECTOMY     CESAREAN SECTION     OOPHORECTOMY       OB History   No obstetric history on file.     History reviewed. No pertinent family history.  Social History   Tobacco Use   Smoking status: Former    Types: Cigarettes    Quit date: 2012    Years since quitting: 10.8   Smokeless tobacco: Never  Substance Use Topics   Alcohol use: Not Currently   Drug use: Never  Home Medications Prior to Admission medications   Medication Sig Start Date End Date Taking? Authorizing Provider  Ascorbic Acid (VITAMIN C) 100 MG tablet Take 100 mg by mouth daily.   Yes [provider]  calcium citrate-vitamin D (CITRACAL+D) 315-200 MG-UNIT tablet Take 1 tablet by mouth daily.   Yes [provider]  Cranberry 500 MG TABS Take 1 tablet by mouth daily.   Yes [provider]  cyclobenzaprine (FLEXERIL) 10 MG tablet Take 10 mg by mouth 3 (three) times daily  as needed for muscle spasms. 04/17/19  Yes [provider]  etodolac (LODINE) 400 MG tablet TAKE 1 TABLET BY MOUTH TWICE A DAY AS NEEDED Patient taking differently: Take 400 mg by mouth 2 (two) times daily as needed for moderate pain. 05/27/21  Yes Sater, Pearletha Furl, MD  FLUoxetine (PROZAC) 20 MG capsule Take 20 mg by mouth daily. 12/16/17  Yes [provider]  gabapentin (NEURONTIN) 300 MG capsule Take 1 capsule (300 mg total) by mouth 3 (three) times daily. 10/14/20  Yes Sater, Pearletha Furl, MD  Inulin (FIBER CHOICE PO) Take 1 tablet by mouth daily.   Yes [provider]  levETIRAcetam (KEPPRA) 750 MG tablet Take 1 tablet (750 mg total) by mouth 2 (two) times daily. 10/14/20  Yes Sater, Pearletha Furl, MD  omeprazole (PRILOSEC) 20 MG capsule Take 20 mg by mouth daily as needed (heartburn). 01/27/21  Yes [provider]  simvastatin (ZOCOR) 20 MG tablet Take 20 mg by mouth every evening. 05/09/13  Yes [provider]    Allergies    Fentanyl  Review of Systems   Review of Systems  Constitutional:  Negative for chills and fever.  Respiratory:  Negative for cough and shortness of breath.   Cardiovascular:  Positive for chest pain. Negative for leg swelling.  Gastrointestinal:  Negative for abdominal pain, nausea and vomiting.  Neurological:  Negative for dizziness.  All other systems reviewed and are negative.  Physical Exam Updated Vital Signs BP (!) 148/95   Pulse 84   Temp 97.9 F (36.6 C) (Oral)   Resp 11   Ht 5\' 1"  (1.549 m)   Wt 81.6 kg   SpO2 97%   BMI 34.01 kg/m   Physical Exam Vitals and nursing note reviewed.  Constitutional:      General: She is not in acute distress.    Appearance: Normal appearance.  Eyes:     General: No scleral icterus.    Extraocular Movements: Extraocular movements intact.  Cardiovascular:     Rate and Rhythm: Normal rate and regular rhythm.     Pulses: Normal pulses.     Heart sounds: Normal heart sounds.   Pulmonary:     Effort: Pulmonary effort is normal. No respiratory distress.     Breath sounds: Normal breath sounds.  Chest:     Comments: No chest wall tenderness.  No obvious deformity, step-off. Abdominal:     General: Bowel sounds are normal.     Palpations: Abdomen is soft. There is no mass.     Tenderness: There is no abdominal tenderness.  Musculoskeletal:        General: Normal range of motion.     Cervical back: Neck supple.     Comments: No pitting edema noted to bilateral extremities  Skin:    General: Skin is warm and dry.     Findings: No rash.  Neurological:     Mental Status: She is alert and oriented to person, place,  and time.     Sensory: Sensation is intact.     Motor: Motor function is intact.  Psychiatric:        Behavior: Behavior normal.    ED Results / Procedures / Treatments   Labs (all labs ordered are listed, but only abnormal results are displayed) Labs Reviewed  BASIC METABOLIC PANEL - Abnormal; Notable for the following components:      Result Value   Glucose, Bld 110 (*)    All other components within normal limits  CBC - Abnormal; Notable for the following components:   RBC 5.29 (*)    Hemoglobin 15.4 (*)    HCT 49.2 (*)    All other components within normal limits  D-DIMER, QUANTITATIVE  TROPONIN I (HIGH SENSITIVITY)  TROPONIN I (HIGH SENSITIVITY)    EKG EKG Interpretation  Date/Time:  Monday June 15 2021 14:13:58 EST Ventricular Rate:  78 PR Interval:  162 QRS Duration: 92 QT Interval:  398 QTC Calculation: 454 R Axis:   37 Text Interpretation: Sinus rhythm Confirmed by Mancel Bale [id number] on 06/15/2021 2:21:23 PM  Radiology DG Chest Port 1 View  Result Date: 06/15/2021 CLINICAL DATA:  Chest pain. EXAM: PORTABLE CHEST 1 VIEW COMPARISON:  None. FINDINGS: Trachea is midline. Heart size normal. Minimal streaky atelectasis or scarring in the medial right lung base. Lungs are otherwise clear. No pleural fluid. IMPRESSION:  No acute findings. Electronically Signed   By: Leanna Battles M.D.   On: 06/15/2021 15:27    Procedures Procedures   Medications Ordered in ED Medications  morphine 4 MG/ML injection 4 mg (4 mg Intravenous Given 06/15/21 1811)    ED Course  I have reviewed the triage vital signs and the nursing notes.  Pertinent labs & imaging results that were available during my care of the patient were reviewed by me and considered in my medical decision making (see chart for details).  Clinical Course as of 06/15/21 1859  Mon Jun 15, 2021  1428 Spoke with patients brother, who noted that the patient began to have right sided chest pain today. Pt was given 2 TUMS by brother prior to EMS arrival. [SB]  1438 Case discussed with Attending, Attending in to evaluate patient.  [SB]  1621 Patient re-evaluated, resting comfortably on stretcher. No pain at this time. Discussed findings with patient and sibling at bedside.  [SB]  1748 Discussed with patient and sibling troponin results.  At this time patient endorses right-sided chest pain.  Will order D-dimer due to patient being in bed-bound due to her MS. [SB]  1753 Discussed new treatment plan with patient and her sibling. Answered questions.  [SB]    Clinical Course User Index [SB] Naziyah Tieszen A, PA-C   MDM Rules/Calculators/A&P HEAR Score: 3                        Patient presents with right sided chest pain since last night.  No chest pain at this time.  Pt with a history of MS and bed-bound at baseline. No prior history of MI. PMHx significant for HTN and HLD. Differential diagnosis ACS, musculoskeletal, PE, PNA.  On exam patient without acute cardiovascular, respiratory, abdominal exam findings. Vital signs stable, patient not hypoxic or tachycardic. EKG without acute ST/T changes.  Troponins negative.  CBC and BMP stable with no acute findings. CXR negative for acute cardiopulmonary findings. Patient given aspirin by EMS prior to arrival. Heart  score with low score for risk  of major adverse cardiac event.  Patient endorsed right-sided chest pain again while in the ED.  Due to the patient being bedbound due to her MS, will order D-dimer to rule out PE.  No unilateral leg swelling, tachycardia, malignancy, oral contraceptive use, or hormone replacement therapies.  D-dimer ordered with results pending.  At signout patient case discussed with attending, Dr. Karene Fry and notification of pending D-dimer. Patient care transition to attending signout.  Final Clinical Impression(s) / ED Diagnoses Final diagnoses:  Chest pain, unspecified type    Rx / DC Orders ED Discharge Orders     None        Kitrina Maurin A, PA-C 06/15/21 1859    Mancel Bale, MD 06/16/21 [phone]

## 2021-06-15 NOTE — ED Notes (Signed)
Patient transported to CT 

## 2021-06-15 NOTE — ED Notes (Signed)
Changed pt brief.

## 2021-06-15 NOTE — ED Triage Notes (Signed)
Pt BIB [facility] from home (lives w/ brother) c/o CP that initially started yesterday and returned today. Pt bedbound, A&Ox4, HX- MS. EMS gave 324 Asprin, EKG unremarkable.

## 2021-06-15 NOTE — ED Notes (Signed)
Xray at the bedside.

## 2021-06-15 NOTE — ED Notes (Signed)
Pt transported to US

## 2021-06-15 NOTE — ED Notes (Signed)
Provided two warm blankets.  

## 2021-06-15 NOTE — Discharge Instructions (Addendum)
Follow with Primary MD Shon Hale, MD in 7 days   Get CBC, CMP, 2 view Chest X ray -  checked next visit within 1 week by Primary MD  Activity: As tolerated with Full fall precautions use walker/cane & assistance as needed  Disposition Home   Diet: Heart Healthy   Special Instructions: If you have smoked or chewed Tobacco  in the last 2 yrs please stop smoking, stop any regular Alcohol  and or any Recreational drug use.  On your next visit with your primary care physician please Get Medicines reviewed and adjusted.  Please request your Prim.MD to go over all Hospital Tests and Procedure/Radiological results at the follow up, please get all Hospital records sent to your Prim MD by signing hospital release before you go home.  If you experience worsening of your admission symptoms, develop shortness of breath, life threatening emergency, suicidal or homicidal thoughts you must seek medical attention immediately by calling 911 or calling your MD immediately  if symptoms less severe.  You Must read complete instructions/literature along with all the possible adverse reactions/side effects for all the Medicines you take and that have been prescribed to you. Take any new Medicines after you have completely understood and accpet all the possible adverse reactions/side effects.

## 2021-06-15 NOTE — ED Provider Notes (Signed)
  Face-to-face evaluation   History: She presents for evaluation of anterior chest wall pain, right-sided started today spontaneously.  No recent trauma.  She is bedbound due to her multiple sclerosis.  She denies cough, fever, chills, nausea, vomiting.  She is with her brother who gives most of the history.  Her pain was severe earlier, now has completely resolved.  Physical exam: Obese, alert and cooperative.  Chest nontender to palpation.  No respiratory distress.  Abdomen is not distended.  Medical screening examination/treatment/procedure(s) were conducted as a shared visit with non-physician practitioner(s) and myself.  I personally evaluated the patient during the encounter    Mancel Bale, MD 06/16/21 [phone]

## 2021-06-15 NOTE — ED Notes (Signed)
Provided pt bag lunch per EDP

## 2021-06-16 ENCOUNTER — Inpatient Hospital Stay (HOSPITAL_COMMUNITY): Payer: Medicare Other

## 2021-06-16 ENCOUNTER — Observation Stay (HOSPITAL_BASED_OUTPATIENT_CLINIC_OR_DEPARTMENT_OTHER): Payer: Medicare Other

## 2021-06-16 DIAGNOSIS — R0789 Other chest pain: Secondary | ICD-10-CM | POA: Diagnosis not present

## 2021-06-16 DIAGNOSIS — R7989 Other specified abnormal findings of blood chemistry: Secondary | ICD-10-CM | POA: Diagnosis not present

## 2021-06-16 DIAGNOSIS — R079 Chest pain, unspecified: Secondary | ICD-10-CM

## 2021-06-16 DIAGNOSIS — K810 Acute cholecystitis: Secondary | ICD-10-CM

## 2021-06-16 LAB — TYPE AND SCREEN
ABO/RH(D): O POS
Antibody Screen: NEGATIVE

## 2021-06-16 LAB — COMPREHENSIVE METABOLIC PANEL
ALT: 32 U/L (ref 0–44)
AST: 20 U/L (ref 15–41)
Albumin: 3.1 g/dL — ABNORMAL LOW (ref 3.5–5.0)
Alkaline Phosphatase: 118 U/L (ref 38–126)
Anion gap: 9 (ref 5–15)
BUN: 9 mg/dL (ref 6–20)
CO2: 27 mmol/L (ref 22–32)
Calcium: 8.6 mg/dL — ABNORMAL LOW (ref 8.9–10.3)
Chloride: 103 mmol/L (ref 98–111)
Creatinine, Ser: 0.6 mg/dL (ref 0.44–1.00)
GFR, Estimated: >60 mL/min (ref >60)
Glucose, Bld: 142 mg/dL — ABNORMAL HIGH (ref 70–99)
Potassium: 3.7 mmol/L (ref 3.5–5.1)
Sodium: 139 mmol/L (ref 135–145)
Total Bilirubin: 0.7 mg/dL (ref 0.3–1.2)
Total Protein: 5.9 g/dL — ABNORMAL LOW (ref 6.5–8.1)

## 2021-06-16 LAB — CBC
HCT: 45.1 % (ref 36.0–46.0)
Hemoglobin: 14.2 g/dL (ref 12.0–15.0)
MCH: 29.6 pg (ref 26.0–34.0)
MCHC: 31.5 g/dL (ref 30.0–36.0)
MCV: 94 fL (ref 80.0–100.0)
Platelets: 195 10*3/uL (ref 150–400)
RBC: 4.8 MIL/uL (ref 3.87–5.11)
RDW: 14.2 % (ref 11.5–15.5)
WBC: 7.8 10*3/uL (ref 4.0–10.5)
nRBC: 0 % (ref 0.0–0.2)

## 2021-06-16 LAB — RESP PANEL BY RT-PCR (FLU A&B, COVID) ARPGX2
Influenza A by PCR: NEGATIVE
Influenza B by PCR: NEGATIVE
SARS Coronavirus 2 by RT PCR: NEGATIVE

## 2021-06-16 LAB — HIV ANTIBODY (ROUTINE TESTING W REFLEX): HIV Screen 4th Generation wRfx: NONREACTIVE

## 2021-06-16 LAB — MAGNESIUM: Magnesium: 2 mg/dL (ref 1.7–2.4)

## 2021-06-16 LAB — BRAIN NATRIURETIC PEPTIDE: B Natriuretic Peptide: 7.9 pg/mL (ref 0.0–100.0)

## 2021-06-16 LAB — PROTIME-INR
INR: 1 (ref 0.8–1.2)
Prothrombin Time: 13.1 seconds (ref 11.4–15.2)

## 2021-06-16 LAB — ABO/RH: ABO/RH(D): O POS

## 2021-06-16 MED ORDER — FLUOXETINE HCL 20 MG PO CAPS
20.0000 mg | ORAL_CAPSULE | Freq: Every day | ORAL | Status: DC
  Administered 2021-06-16 – 2021-06-17 (×2): 20 mg via ORAL
  Filled 2021-06-16 (×2): qty 1

## 2021-06-16 MED ORDER — MORPHINE SULFATE (PF) 2 MG/ML IV SOLN: 1.0000 mg | INTRAVENOUS | Status: DC | PRN

## 2021-06-16 MED ORDER — GABAPENTIN 300 MG PO CAPS
300.0000 mg | ORAL_CAPSULE | Freq: Three times a day (TID) | ORAL | Status: DC
  Administered 2021-06-16 – 2021-06-17 (×5): 300 mg via ORAL
  Filled 2021-06-16 (×5): qty 1

## 2021-06-16 MED ORDER — HEPARIN SODIUM (PORCINE) 5000 UNIT/ML IJ SOLN
5000.0000 [IU] | Freq: Three times a day (TID) | INTRAMUSCULAR | Status: DC
  Administered 2021-06-16 – 2021-06-17 (×4): 5000 [IU] via SUBCUTANEOUS
  Filled 2021-06-16 (×4): qty 1

## 2021-06-16 MED ORDER — ACETAMINOPHEN 650 MG RE SUPP: 650.0000 mg | Freq: Four times a day (QID) | RECTAL | Status: DC | PRN

## 2021-06-16 MED ORDER — LEVETIRACETAM 750 MG PO TABS
750.0000 mg | ORAL_TABLET | Freq: Two times a day (BID) | ORAL | Status: DC
  Administered 2021-06-16 – 2021-06-17 (×3): 750 mg via ORAL
  Filled 2021-06-16 (×4): qty 1

## 2021-06-16 MED ORDER — PANTOPRAZOLE SODIUM 40 MG IV SOLR
40.0000 mg | INTRAVENOUS | Status: DC
  Administered 2021-06-16 – 2021-06-17 (×2): 40 mg via INTRAVENOUS
  Filled 2021-06-16 (×2): qty 40

## 2021-06-16 MED ORDER — SODIUM CHLORIDE 0.9 % IV SOLN: INTRAVENOUS | Status: AC

## 2021-06-16 MED ORDER — ACETAMINOPHEN 325 MG PO TABS: 650.0000 mg | ORAL_TABLET | Freq: Four times a day (QID) | ORAL | Status: DC | PRN

## 2021-06-16 MED ORDER — SIMVASTATIN 20 MG PO TABS
20.0000 mg | ORAL_TABLET | Freq: Every evening | ORAL | Status: DC
  Administered 2021-06-16 – 2021-06-17 (×2): 20 mg via ORAL
  Filled 2021-06-16 (×2): qty 1

## 2021-06-16 MED ORDER — METRONIDAZOLE 500 MG/100ML IV SOLN
500.0000 mg | Freq: Three times a day (TID) | INTRAVENOUS | Status: DC
  Administered 2021-06-16 – 2021-06-17 (×4): 500 mg via INTRAVENOUS
  Filled 2021-06-16 (×4): qty 100

## 2021-06-16 MED ORDER — SODIUM CHLORIDE 0.9 % IV SOLN
2.0000 g | INTRAVENOUS | Status: DC
  Administered 2021-06-16 – 2021-06-17 (×2): 2 g via INTRAVENOUS
  Filled 2021-06-16 (×2): qty 20

## 2021-06-16 MED ORDER — TECHNETIUM TC 99M MEBROFENIN IV KIT
5.4000 | PACK | Freq: Once | INTRAVENOUS | Status: AC | PRN
  Administered 2021-06-16: 5.4 via INTRAVENOUS

## 2021-06-16 NOTE — Progress Notes (Signed)
Lower extremity venous bilateral study completed.   Please see CV Proc for preliminary results.   Nyiah Pianka, RDMS, RVT  

## 2021-06-16 NOTE — Consult Note (Signed)
Reason for Consult:gallstones Referring Provider: Thanya Voeks is an 55 y.o. female.  HPI: 55 yo female with 1 day of right sided chest pain and shoulder pain. She was brought to ED and treated for possible coronary syndrome. However, troponin and cardiac work up is negative. She had an elevated d dimer with negative CTA chest. She does not feel the pain is related to food. She denies diarrhea or nausea. She has had pain like this in the past but not this severe.  Past Medical History:  Diagnosis Date   Early onset Alzheimer's dementia ([facility])    Hypercholesteremia    Hyperlipidemia    Hypertension    Lumbosacral pain    Multiple sclerosis ([place])    Seizure ([place])    Vitamin D deficiency     Past Surgical History:  Procedure Laterality Date   ABDOMINAL HYSTERECTOMY     CESAREAN SECTION     OOPHORECTOMY      History reviewed. No pertinent family history.  Social History:  reports that she quit smoking about 10 years ago. She has never used smokeless tobacco. She reports that she does not currently use alcohol. She reports that she does not use drugs.  Allergies:  Allergies  Allergen Reactions   Fentanyl     Other reaction(s): can not have anything on her skin    Medications: I have reviewed the patient's current medications.  Results for orders placed or performed during the hospital encounter of 06/15/21 (from the past 48 hour(s))  Basic metabolic panel     Status: Abnormal   Collection Time: 06/15/21  2:07 PM  Result Value Ref Range   Sodium 141 135 - 145 mmol/L   Potassium 3.6 3.5 - 5.1 mmol/L   Chloride 99 98 - 111 mmol/L   CO2 29 22 - 32 mmol/L   Glucose, Bld 110 (H) 70 - 99 mg/dL    Comment: Glucose reference range applies only to samples taken after fasting for at least 8 hours.   BUN 6 6 - 20 mg/dL   Creatinine, Ser 0.51 0.44 - 1.00 mg/dL   Calcium 9.1 8.9 - 10.3 mg/dL   GFR, Estimated >60 >60 mL/min    Comment: (NOTE) Calculated using  the CKD-EPI Creatinine Equation (2021)    Anion gap 13 5 - 15    Comment: Performed at [facility] [address] [place], [place] [address]  CBC     Status: Abnormal   Collection Time: 06/15/21  2:07 PM  Result Value Ref Range   WBC 7.5 4.0 - 10.5 K/uL   RBC 5.29 (H) 3.87 - 5.11 MIL/uL   Hemoglobin 15.4 (H) 12.0 - 15.0 g/dL   HCT 49.2 (H) 36.0 - 46.0 %   MCV 93.0 80.0 - 100.0 fL   MCH 29.1 26.0 - 34.0 pg   MCHC 31.3 30.0 - 36.0 g/dL   RDW 14.1 11.5 - 15.5 %   Platelets 201 150 - 400 K/uL   nRBC 0.0 0.0 - 0.2 %    Comment: Performed at [facility], [place] [address], [place], [place] [address]  Troponin I (High Sensitivity)     Status: None   Collection Time: 06/15/21  2:07 PM  Result Value Ref Range   Troponin I (High Sensitivity) 4 <18 ng/L    Comment: (NOTE) Elevated high sensitivity troponin I (hsTnI) values and significant  changes across serial measurements may suggest ACS but many other  chronic and acute  conditions are known to elevate hsTnI results.  Refer to the "Links" section for chest pain algorithms and additional  guidance. Performed at [facility], [address], [place], [place] [address]   Troponin I (High Sensitivity)     Status: None   Collection Time: 06/15/21  4:07 PM  Result Value Ref Range   Troponin I (High Sensitivity) 5 <18 ng/L    Comment: (NOTE) Elevated high sensitivity troponin I (hsTnI) values and significant  changes across serial measurements may suggest ACS but many other  chronic and acute conditions are known to elevate hsTnI results.  Refer to the "Links" section for chest pain algorithms and additional  guidance. Performed at [facility], [address], [place], [place] [address]   D-dimer, quantitative     Status: Abnormal   Collection Time: 06/15/21  6:13 PM  Result Value Ref Range   D-Dimer, Quant 1.52 (H) 0.00 - 0.50 ug/mL-FEU    Comment: (NOTE) At the manufacturer cut-off value of 0.5 g/mL FEU,  this assay has a negative predictive value of 95-100%.This assay is intended for use in conjunction with a clinical pretest probability (PTP) assessment model to exclude pulmonary embolism (PE) and deep venous thrombosis (DVT) in outpatients suspected of PE or DVT. Results should be correlated with clinical presentation. Performed at [facility], [address] [place], [place] [address]   Hepatic function panel     Status: Abnormal   Collection Time: 06/15/21  9:58 PM  Result Value Ref Range   Total Protein 6.4 (L) 6.5 - 8.1 g/dL   Albumin 3.4 (L) 3.5 - 5.0 g/dL   AST 23 15 - 41 U/L   ALT 33 0 - 44 U/L   Alkaline Phosphatase 107 38 - 126 U/L   Total Bilirubin 1.8 (H) 0.3 - 1.2 mg/dL   Bilirubin, Direct 0.3 (H) 0.0 - 0.2 mg/dL   Indirect Bilirubin 1.5 (H) 0.3 - 0.9 mg/dL    Comment: Performed at [facility], [address] [place], [place] [address]  Resp Panel by RT-PCR (Flu A&B, Covid) Nasopharyngeal Swab     Status: None   Collection Time: 06/16/21  3:08 AM   Specimen: Nasopharyngeal Swab; Nasopharyngeal(NP) swabs in vial transport medium  Result Value Ref Range   SARS Coronavirus 2 by RT PCR NEGATIVE NEGATIVE    Comment: (NOTE) SARS-CoV-2 target nucleic acids are NOT DETECTED.  The SARS-CoV-2 RNA is generally detectable in upper respiratory specimens during the acute phase of infection. The lowest concentration of SARS-CoV-2 viral copies this assay can detect is 138 copies/mL. A negative result does not preclude SARS-Cov-2 infection and should not be used as the sole basis for treatment or other patient management decisions. A negative result may occur with  improper specimen collection/handling, submission of specimen other than nasopharyngeal swab, presence of viral mutation(s) within the areas targeted by this assay, and inadequate number of viral copies(<138 copies/mL). A negative result must be combined with clinical observations, patient history, and  epidemiological information. The expected result is Negative.  Fact Sheet for Patients:  BloggerCourse.com  Fact Sheet for Healthcare Providers:  SeriousBroker.it  This test is no t yet approved or cleared by the [place] FDA and  has been authorized for detection and/or diagnosis of SARS-CoV-2 by FDA under an Emergency Use Authorization (EUA). This EUA will remain  in effect (meaning this test can be used) for the duration of the COVID-19 declaration under Section 564(b)(1) of  the Act, 21 U.S.C.section 360bbb-3(b)(1), unless the authorization is terminated  or revoked sooner.       Influenza A by PCR NEGATIVE NEGATIVE   Influenza B by PCR NEGATIVE NEGATIVE    Comment: (NOTE) The Xpert Xpress SARS-CoV-2/FLU/RSV plus assay is intended as an aid in the diagnosis of influenza from Nasopharyngeal swab specimens and should not be used as a sole basis for treatment. Nasal washings and aspirates are unacceptable for Xpert Xpress SARS-CoV-2/FLU/RSV testing.  Fact Sheet for Patients: EntrepreneurPulse.com.au  Fact Sheet for Healthcare Providers: IncredibleEmployment.be  This test is not yet approved or cleared by the [place] FDA and has been authorized for detection and/or diagnosis of SARS-CoV-2 by FDA under an Emergency Use Authorization (EUA). This EUA will remain in effect (meaning this test can be used) for the duration of the COVID-19 declaration under Section 564(b)(1) of the Act, 21 U.S.C. section 360bbb-3(b)(1), unless the authorization is terminated or revoked.  Performed at [facility], [place] [address], [place], [place] [address]     CT Angio Chest PE W and/or Wo Contrast  Result Date: 06/15/2021 CLINICAL DATA:  PE suspected, low/intermediate prob, positive D-dimer Chest pain. EXAM: CT ANGIOGRAPHY CHEST WITH CONTRAST TECHNIQUE: Multidetector CT imaging of the chest  was performed using the standard protocol during bolus administration of intravenous contrast. Multiplanar CT image reconstructions and MIPs were obtained to evaluate the vascular anatomy. CONTRAST:  62mL OMNIPAQUE IOHEXOL 350 MG/ML SOLN COMPARISON:  Radiograph earlier today. FINDINGS: Cardiovascular: There are no filling defects within the pulmonary arteries to suggest pulmonary embolus. Thoracic aorta is normal in caliber. No aortic dissection or acute aortic finding. The heart is normal in size. Trace pericardial effusion. Occasional coronary artery calcifications. Mediastinum/Nodes: Scattered small mediastinal and hilar lymph nodes, not enlarged by size criteria. Tiny hiatal hernia. No suspicious thyroid nodule. Lungs/Pleura: Breathing motion artifact limits detailed parenchymal assessment moderate emphysema with bronchial thickening. Dependent atelectasis in the right lower lobe. No evidence of pneumonia or confluent consolidation. There is mild lower lobe bronchiectasis dependently. No pleural fluid. No findings of pulmonary edema. Multiple small pulmonary nodules. 3 mm right upper lobe nodule series 6, image 21. 4 mm right upper lobe nodule series 6, image 24. 2 mm right upper lobe nodule series 6, image 36. Trachea and central bronchi are patent. Upper Abdomen: No acute or unexpected findings. Musculoskeletal: There are no acute or suspicious osseous abnormalities. No chest wall soft tissue abnormality. Review of the MIP images confirms the above findings. IMPRESSION: 1. No pulmonary embolus. 2. Moderate lower lobe atelectasis. No other acute intrathoracic process. 3. Emphysema. 4. Multiple small pulmonary nodules measuring up to 4 mm. No follow-up needed if patient is low-risk (and has no known or suspected primary neoplasm). Non-contrast chest CT can be considered in 12 months if patient is high-risk. This recommendation follows the consensus statement: Guidelines for Management of Incidental Pulmonary  Nodules Detected on CT Images: From the Fleischner Society 2017; Radiology 2017; 284:228-243. Aortic Atherosclerosis (ICD10-I70.0) and Emphysema (ICD10-J43.9). Electronically Signed   By: Keith Rake M.D.   On: 06/15/2021 20:55   DG Chest Port 1 View  Result Date: 06/15/2021 CLINICAL DATA:  Chest pain. EXAM: PORTABLE CHEST 1 VIEW COMPARISON:  None. FINDINGS: Trachea is midline. Heart size normal. Minimal streaky atelectasis or scarring in the medial right lung base. Lungs are otherwise clear. No pleural fluid. IMPRESSION: No acute findings. Electronically Signed   By: Lorin Picket M.D.   On: 06/15/2021 15:27   US  Abdomen Limited RUQ (LIVER/GB)  Result Date: 06/15/2021 CLINICAL DATA:  Right upper quadrant pain EXAM: ULTRASOUND ABDOMEN LIMITED RIGHT UPPER QUADRANT COMPARISON:  None. FINDINGS: Gallbladder: Gallbladder is well distended with multiple stones. No wall thickening or pericholecystic fluid is noted. Positive sonographic Murphy's sign is elicited. Common bile duct: Diameter: 3.7 mm. Liver: 6 mm simple cyst is noted in the left lobe of the liver. Diffuse increased echogenicity is noted consistent with fatty infiltration. Portal vein is patent on color Doppler imaging with normal direction of blood flow towards the liver. Other: None. IMPRESSION: Cholelithiasis with positive sonographic Murphy's sign suspicious for acute cholecystitis. HIDA scan may be helpful for further evaluation. Fatty liver. Electronically Signed   By: Inez Catalina M.D.   On: 06/15/2021 21:40    Review of Systems  Constitutional: Negative.   HENT: Negative.    Eyes: Negative.   Respiratory: Negative.    Cardiovascular:  Positive for chest pain.  Gastrointestinal: Negative.  Negative for nausea and vomiting.  Genitourinary: Negative.   Musculoskeletal: Negative.   Skin: Negative.   Neurological: Negative.   Endo/Heme/Allergies: Negative.   Psychiatric/Behavioral: Negative.     PE Blood pressure 115/85,  pulse 89, temperature 98.6 F (37 C), temperature source Oral, resp. rate 18, height 5\' 1"  (1.549 m), weight 81.6 kg, SpO2 99 %. Constitutional: NAD; conversant; no deformities Eyes: Moist conjunctiva; no lid lag; anicteric; PERRL Neck: Trachea midline; no thyromegaly Lungs: Normal respiratory effort; no tactile fremitus CV: RRR; no palpable thrills; no pitting edema GI: Abd slight tenderness in lateral right subcostal area; no palpable hepatosplenomegaly MSK: bed bound; no clubbing/cyanosis Psychiatric: Appropriate affect; alert and oriented x3 Lymphatic: No palpable cervical or axillary lymphadenopathy Skin: No major subcutaneous nodules. Warm and dry   Assessment/Plan: 55 yo female with right chest pain and gallstones. No leukocytosis. Indirect bilirubin elevated. No transaminitis. [place] with multiple moderate sized stones without fluid or wall thickening. CBD normal size. -agree with empiric antibiotics and HIDA scan  Arta Bruce Lyell Clugston 06/16/2021, 6:27 AM

## 2021-06-16 NOTE — Progress Notes (Signed)
PROGRESS NOTE                                                                                                                                                                                                             Patient Demographics:    Brandy Mckinney, is a 55 y.o. female, DOB - [birth year], VXU:[id number]  Outpatient Primary MD for the patient is Shon Hale, MD    LOS - 1  Admit date - 06/15/2021    Chief Complaint  Patient presents with   Chest Pain       Brief Narrative (HPI from H&P)  -  Brandy Mckinney is a 55 y.o. female with medical history significant of Alzheimer's dementia, hyperlipidemia, hypertension, MS - bedbound at baseline, seizure disorder presented to the ED complaining of right-sided chest pain, work-up in the ER was suspicious for acute cholecystitis she was seen by general surgery and admitted for further work-up.   Subjective:    Brandy Mckinney today has, No headache, No chest pain, No abdominal pain - No Nausea, No new weakness tingling or numbness, no shortness of breath.   Assessment  & Plan :     Nonspecific right-sided chest discomfort.  Work-up in the ER suggestive of possible cholecystitis.  Ultrasound suspicious for positive Murphy sign, currently exam and labs are stable await HIDA scan for further management.  She appears stable with supportive care.  General surgery on board.  2.  Multiple bilateral lung nodules.  Outpatient follow-up with pulmonary within a week of discharge needs repeat CT scan in a few months.  Must follow with pulmonary.  No PE on CTA.  3.  Advanced Alzheimer's dementia.  At risk for delirium, supportive care.  4.  History of seizures.  Continue Keppra.  5.  Underlying MS.  Baseline bedbound.  Supportive care.  6.  Dyslipidemia.  On statin.  7.  Mildly elevated D-dimer.  CTA negative await leg ultrasound.      Condition -  Fair  Family Communication  : Called brother Maisie Fus [phone]  06/14/2021 at 9:17 AM and message left.  Code Status :  Full  Consults  :  CCS  PUD Prophylaxis :    Procedures  :     CTA - 1. No pulmonary embolus. 2. Moderate lower lobe atelectasis. No other acute intrathoracic process. 3.  Emphysema. 4. Multiple small pulmonary nodules measuring up to 4 mm. No follow-up needed if patient is low-risk (and has no known or suspected primary neoplasm). Non-contrast chest CT can be considered in 12 months.  [place] - Cholelithiasis with positive sonographic Murphy's sign suspicious for acute cholecystitis  HIDA  Leg V. Ultrasound.      Disposition Plan  :    Status is: Inpatient  Remains inpatient appropriate because: Work-up for possible acute cholecystitis  DVT Prophylaxis  :  Heparin    Lab Results  Component Value Date   PLT 195 06/16/2021    Diet :  Diet Order             Diet NPO time specified Except for: Sips with Meds  Diet effective now                    Inpatient Medications  Scheduled Meds:  FLUoxetine  20 mg Oral Daily   gabapentin  300 mg Oral TID   levETIRAcetam  750 mg Oral BID   simvastatin  20 mg Oral QPM   Continuous Infusions:  sodium chloride 125 mL/hr at 06/16/21 0552   cefTRIAXone (ROCEPHIN)  IV Stopped (06/16/21 0630)   metronidazole Stopped (06/16/21 9242)   PRN Meds:.acetaminophen **OR** acetaminophen, morphine injection  Antibiotics  :    Anti-infectives (From admission, onward)    Start     Dose/Rate Route Frequency Ordered Stop   06/16/21 0600  cefTRIAXone (ROCEPHIN) 2 g in sodium chloride 0.9 % 100 mL IVPB        2 g 200 mL/hr over 30 Minutes Intravenous Every 24 hours 06/16/21 0520     06/16/21 0530  metroNIDAZOLE (FLAGYL) IVPB 500 mg        500 mg 100 mL/hr over 60 Minutes Intravenous Every 8 hours 06/16/21 0521          Time Spent in minutes  30   Susa Raring M.D on 06/16/2021 at 9:16 AM  To page go to  [email or web address]   [facility] -  Office  [phone]  See all Orders from today for further details    Objective:   Vitals:   06/16/21 0500 06/16/21 0604 06/16/21 0700 06/16/21 0715  BP: 115/85  120/79 134/76  Pulse: 89  89 93  Resp: 18   12  Temp:  98.6 F (37 C)    TempSrc:  Oral    SpO2: 99%  100% 98%  Weight:      Height:        Wt Readings from Last 3 Encounters:  06/15/21 81.6 kg    No intake or output data in the 24 hours ending 06/16/21 0916   Physical Exam  Awake Alert x 1, No new F.N deficits,    [place].AT,PERRAL Supple Neck, No JVD,   Symmetrical Chest wall movement, Good air movement bilaterally, CTAB RRR,No Gallops,Rubs or new Murmurs,  +ve B.Sounds, Abd Soft, No tenderness,   No Cyanosis, Clubbing or edema        Data Review:    CBC Recent Labs  Lab 06/15/21 1407 06/16/21 0718  WBC 7.5 7.8  HGB 15.4* 14.2  HCT 49.2* 45.1  PLT 201 195  MCV 93.0 94.0  MCH 29.1 29.6  MCHC 31.3 31.5  RDW 14.1 14.2    Electrolytes Recent Labs  Lab 06/15/21 1407 06/15/21 1813 06/15/21 2158 06/16/21 0718 06/16/21 0730  NA 141  --   --  139  --   K 3.6  --   --  3.7  --   CL 99  --   --  103  --   CO2 29  --   --  27  --   GLUCOSE 110*  --   --  142*  --   BUN 6  --   --  9  --   CREATININE 0.51  --   --  0.60  --   CALCIUM 9.1  --   --  8.6*  --   AST  --   --  23 20  --   ALT  --   --  33 32  --   ALKPHOS  --   --  107 118  --   BILITOT  --   --  1.8* 0.7  --   ALBUMIN  --   --  3.4* 3.1*  --   MG  --   --   --  2.0  --   DDIMER  --  1.52*  --   --   --   INR  --   --   --  1.0  --   BNP  --   --   --   --  7.9    ------------------------------------------------------------------------------------------------------------------ No results for input(s): CHOL, HDL, LDLCALC, TRIG, CHOLHDL, LDLDIRECT in the last 72 hours.  No results found for: HGBA1C  No results for input(s): TSH, T4TOTAL, T3FREE, THYROIDAB in the last 72  hours.  Invalid input(s): FREET3 ------------------------------------------------------------------------------------------------------------------ ID Labs Recent Labs  Lab 06/15/21 1407 06/15/21 1813 06/16/21 0718  WBC 7.5  --  7.8  PLT 201  --  195  DDIMER  --  1.52*  --   CREATININE 0.51  --  0.60   Cardiac Enzymes No results for input(s): CKMB, TROPONINI, MYOGLOBIN in the last 168 hours.  Invalid input(s): CK    Radiology Reports CT Angio Chest PE W and/or Wo Contrast  Result Date: 06/15/2021 CLINICAL DATA:  PE suspected, low/intermediate prob, positive D-dimer Chest pain. EXAM: CT ANGIOGRAPHY CHEST WITH CONTRAST TECHNIQUE: Multidetector CT imaging of the chest was performed using the standard protocol during bolus administration of intravenous contrast. Multiplanar CT image reconstructions and MIPs were obtained to evaluate the vascular anatomy. CONTRAST:  62mL OMNIPAQUE IOHEXOL 350 MG/ML SOLN COMPARISON:  Radiograph earlier today. FINDINGS: Cardiovascular: There are no filling defects within the pulmonary arteries to suggest pulmonary embolus. Thoracic aorta is normal in caliber. No aortic dissection or acute aortic finding. The heart is normal in size. Trace pericardial effusion. Occasional coronary artery calcifications. Mediastinum/Nodes: Scattered small mediastinal and hilar lymph nodes, not enlarged by size criteria. Tiny hiatal hernia. No suspicious thyroid nodule. Lungs/Pleura: Breathing motion artifact limits detailed parenchymal assessment moderate emphysema with bronchial thickening. Dependent atelectasis in the right lower lobe. No evidence of pneumonia or confluent consolidation. There is mild lower lobe bronchiectasis dependently. No pleural fluid. No findings of pulmonary edema. Multiple small pulmonary nodules. 3 mm right upper lobe nodule series 6, image 21. 4 mm right upper lobe nodule series 6, image 24. 2 mm right upper lobe nodule series 6, image 36. Trachea and  central bronchi are patent. Upper Abdomen: No acute or unexpected findings. Musculoskeletal: There are no acute or suspicious osseous abnormalities. No chest wall soft tissue abnormality. Review of the MIP images confirms the above findings. IMPRESSION: 1. No pulmonary embolus. 2. Moderate lower lobe atelectasis. No other acute intrathoracic process. 3. Emphysema. 4. Multiple small pulmonary nodules measuring up to 4 mm. No follow-up needed if patient  is low-risk (and has no known or suspected primary neoplasm). Non-contrast chest CT can be considered in 12 months if patient is high-risk. This recommendation follows the consensus statement: Guidelines for Management of Incidental Pulmonary Nodules Detected on CT Images: From the Fleischner Society 2017; Radiology 2017; 284:228-243. Aortic Atherosclerosis (ICD10-I70.0) and Emphysema (ICD10-J43.9). Electronically Signed   By: Narda Rutherford M.D.   On: 06/15/2021 20:55   DG Chest Port 1 View  Result Date: 06/15/2021 CLINICAL DATA:  Chest pain. EXAM: PORTABLE CHEST 1 VIEW COMPARISON:  None. FINDINGS: Trachea is midline. Heart size normal. Minimal streaky atelectasis or scarring in the medial right lung base. Lungs are otherwise clear. No pleural fluid. IMPRESSION: No acute findings. Electronically Signed   By: Leanna Battles M.D.   On: 06/15/2021 15:27   US Abdomen Limited RUQ (LIVER/GB)  Result Date: 06/15/2021 CLINICAL DATA:  Right upper quadrant pain EXAM: ULTRASOUND ABDOMEN LIMITED RIGHT UPPER QUADRANT COMPARISON:  None. FINDINGS: Gallbladder: Gallbladder is well distended with multiple stones. No wall thickening or pericholecystic fluid is noted. Positive sonographic Murphy's sign is elicited. Common bile duct: Diameter: 3.7 mm. Liver: 6 mm simple cyst is noted in the left lobe of the liver. Diffuse increased echogenicity is noted consistent with fatty infiltration. Portal vein is patent on color Doppler imaging with normal direction of blood flow  towards the liver. Other: None. IMPRESSION: Cholelithiasis with positive sonographic Murphy's sign suspicious for acute cholecystitis. HIDA scan may be helpful for further evaluation. Fatty liver. Electronically Signed   By: Alcide Clever M.D.   On: 06/15/2021 21:40

## 2021-06-16 NOTE — Care Management Obs Status (Signed)
MEDICARE OBSERVATION STATUS NOTIFICATION   Patient Details  Name: Brandy Mckinney MRN: [id number] Date of Birth: 07-21-1966   Medicare Observation Status Notification Given:  Yes    Oletta Cohn, RN 06/16/2021, 2:48 PM

## 2021-06-16 NOTE — ED Notes (Signed)
Assumed care on patient , resting with no distress , respirations unlabored , denies pain , IV site intact .

## 2021-06-16 NOTE — H&P (Signed)
History and Physical    Brandy Mckinney [id number] DOB: 11-17-1965 DOA: 06/15/2021  PCP: Glenis Smoker, MD Patient coming from: Home  Chief Complaint: Chest pain  HPI: Brandy Mckinney is a 55 y.o. female with medical history significant of Alzheimer's dementia, hyperlipidemia, hypertension, MS and bedbound at baseline, seizure disorder presented to the ED complaining of right-sided chest pain.  She was given aspirin 324 mg by EMS.  Vital signs stable.  Labs showing WBC 7.5, hemoglobin 15.4, platelet count 201k.  Sodium 141, potassium 3.6, chloride 99, bicarb 29, BUN 6, creatinine 0.5, glucose 110.  High-sensitivity troponin negative x2.  D-dimer 1.52.  T bili 1.8, remainder of LFTs normal.  COVID and influenza PCR negative.  Chest x-ray showing no acute findings.  CT angiogram chest negative for PE.  She had right upper quadrant tenderness on exam and ultrasound showing cholelithiasis with positive sonographic Murphy sign suspicious for acute cholecystitis.  ED physician consulted general surgery (Dr. Kieth Brightly) who recommended obtaining HIDA scan.  Patient was given morphine and 1 L LR bolus.  Patient reports intermittent episodes of right upper quadrant abdominal pain for the past 2 weeks.  States yesterday she started having right-sided chest pain which was sharp.  Denies nausea, vomiting, or fevers.  Her appetite is good and the pain is not worse after eating.  She is not able to give any additional history.  Review of Systems:  All systems reviewed and apart from history of presenting illness, are negative.  Past Medical History:  Diagnosis Date   Early onset Alzheimer's dementia ([place])    Hypercholesteremia    Hyperlipidemia    Hypertension    Lumbosacral pain    Multiple sclerosis ([place])    Seizure ([place])    Vitamin D deficiency     Past Surgical History:  Procedure Laterality Date   ABDOMINAL HYSTERECTOMY     CESAREAN SECTION     OOPHORECTOMY       reports  that she quit smoking about 10 years ago. She has never used smokeless tobacco. She reports that she does not currently use alcohol. She reports that she does not use drugs.  Allergies  Allergen Reactions   Fentanyl     Other reaction(s): can not have anything on her skin    History reviewed. No pertinent family history.  Prior to Admission medications   Medication Sig Start Date End Date Taking? Authorizing Provider  Ascorbic Acid (VITAMIN C) 100 MG tablet Take 100 mg by mouth daily.   Yes [provider]  calcium citrate-vitamin D (CITRACAL+D) 315-200 MG-UNIT tablet Take 1 tablet by mouth daily.   Yes [provider]  Cranberry 500 MG TABS Take 1 tablet by mouth daily.   Yes [provider]  cyclobenzaprine (FLEXERIL) 10 MG tablet Take 10 mg by mouth 3 (three) times daily as needed for muscle spasms. 04/17/19  Yes [provider]  etodolac (LODINE) 400 MG tablet TAKE 1 TABLET BY MOUTH TWICE A DAY AS NEEDED Patient taking differently: Take 400 mg by mouth 2 (two) times daily as needed for moderate pain. 05/27/21  Yes Sater, Nanine Means, MD  FLUoxetine (PROZAC) 20 MG capsule Take 20 mg by mouth daily. 12/16/17  Yes [provider]  gabapentin (NEURONTIN) 300 MG capsule Take 1 capsule (300 mg total) by mouth 3 (three) times daily. 10/14/20  Yes Sater, Nanine Means, MD  Inulin (FIBER CHOICE PO) Take 1 tablet by mouth daily.   Yes [provider]  levETIRAcetam (KEPPRA)  750 MG tablet Take 1 tablet (750 mg total) by mouth 2 (two) times daily. 10/14/20  Yes Sater, Pearletha Furl, MD  omeprazole (PRILOSEC) 20 MG capsule Take 20 mg by mouth daily as needed (heartburn). 01/27/21  Yes [provider]  simvastatin (ZOCOR) 20 MG tablet Take 20 mg by mouth every evening. 05/09/13  Yes [provider]    Physical Exam: Vitals:   06/16/21 0345 06/16/21 0400 06/16/21 0415 06/16/21 0500  BP: 137/72 124/77 (!) 123/93 115/85  Pulse: 86 81 82 89   Resp: 11 14 18 18   Temp:      TempSrc:      SpO2: 97% 97% 100% 99%  Weight:      Height:        Physical Exam Constitutional:      General: She is not in acute distress. HENT:     Head: Normocephalic and atraumatic.     Mouth/Throat:     Mouth: Mucous membranes are dry.  Eyes:     Extraocular Movements: Extraocular movements intact.     Conjunctiva/sclera: Conjunctivae normal.  Cardiovascular:     Rate and Rhythm: Normal rate and regular rhythm.     Pulses: Normal pulses.  Pulmonary:     Effort: Pulmonary effort is normal. No respiratory distress.     Breath sounds: Normal breath sounds. No wheezing or rales.  Abdominal:     General: Bowel sounds are normal. There is no distension.     Palpations: Abdomen is soft.     Tenderness: There is abdominal tenderness. There is guarding. There is no rebound.     Comments: Right upper quadrant tender to palpation with guarding  Musculoskeletal:        General: No swelling or tenderness.     Cervical back: Normal range of motion and neck supple.  Skin:    General: Skin is warm and dry.  Neurological:     General: No focal deficit present.     Mental Status: She is alert and oriented to person, place, and time.     Labs on Admission: I have personally reviewed following labs and imaging studies  CBC: Recent Labs  Lab 06/15/21 1407  WBC 7.5  HGB 15.4*  HCT 49.2*  MCV 93.0  PLT 201   Basic Metabolic Panel: Recent Labs  Lab 06/15/21 1407  NA 141  K 3.6  CL 99  CO2 29  GLUCOSE 110*  BUN 6  CREATININE 0.51  CALCIUM 9.1   GFR: Estimated Creatinine Clearance: 78.7 mL/min (by C-G formula based on SCr of 0.51 mg/dL). Liver Function Tests: Recent Labs  Lab 06/15/21 2158  AST 23  ALT 33  ALKPHOS 107  BILITOT 1.8*  PROT 6.4*  ALBUMIN 3.4*   No results for input(s): LIPASE, AMYLASE in the last 168 hours. No results for input(s): AMMONIA in the last 168 hours. Coagulation Profile: No results for input(s): INR,  PROTIME in the last 168 hours. Cardiac Enzymes: No results for input(s): CKTOTAL, CKMB, CKMBINDEX, TROPONINI in the last 168 hours. BNP (last 3 results) No results for input(s): PROBNP in the last 8760 hours. HbA1C: No results for input(s): HGBA1C in the last 72 hours. CBG: No results for input(s): GLUCAP in the last 168 hours. Lipid Profile: No results for input(s): CHOL, HDL, LDLCALC, TRIG, CHOLHDL, LDLDIRECT in the last 72 hours. Thyroid Function Tests: No results for input(s): TSH, T4TOTAL, FREET4, T3FREE, THYROIDAB in the last 72 hours. Anemia Panel: No results for input(s): VITAMINB12, FOLATE,  FERRITIN, TIBC, IRON, RETICCTPCT in the last 72 hours. Urine analysis: No results found for: COLORURINE, APPEARANCEUR, LABSPEC, PHURINE, GLUCOSEU, HGBUR, BILIRUBINUR, KETONESUR, PROTEINUR, UROBILINOGEN, NITRITE, LEUKOCYTESUR  Radiological Exams on Admission: CT Angio Chest PE W and/or Wo Contrast  Result Date: 06/15/2021 CLINICAL DATA:  PE suspected, low/intermediate prob, positive D-dimer Chest pain. EXAM: CT ANGIOGRAPHY CHEST WITH CONTRAST TECHNIQUE: Multidetector CT imaging of the chest was performed using the standard protocol during bolus administration of intravenous contrast. Multiplanar CT image reconstructions and MIPs were obtained to evaluate the vascular anatomy. CONTRAST:  24mL OMNIPAQUE IOHEXOL 350 MG/ML SOLN COMPARISON:  Radiograph earlier today. FINDINGS: Cardiovascular: There are no filling defects within the pulmonary arteries to suggest pulmonary embolus. Thoracic aorta is normal in caliber. No aortic dissection or acute aortic finding. The heart is normal in size. Trace pericardial effusion. Occasional coronary artery calcifications. Mediastinum/Nodes: Scattered small mediastinal and hilar lymph nodes, not enlarged by size criteria. Tiny hiatal hernia. No suspicious thyroid nodule. Lungs/Pleura: Breathing motion artifact limits detailed parenchymal assessment moderate emphysema  with bronchial thickening. Dependent atelectasis in the right lower lobe. No evidence of pneumonia or confluent consolidation. There is mild lower lobe bronchiectasis dependently. No pleural fluid. No findings of pulmonary edema. Multiple small pulmonary nodules. 3 mm right upper lobe nodule series 6, image 21. 4 mm right upper lobe nodule series 6, image 24. 2 mm right upper lobe nodule series 6, image 36. Trachea and central bronchi are patent. Upper Abdomen: No acute or unexpected findings. Musculoskeletal: There are no acute or suspicious osseous abnormalities. No chest wall soft tissue abnormality. Review of the MIP images confirms the above findings. IMPRESSION: 1. No pulmonary embolus. 2. Moderate lower lobe atelectasis. No other acute intrathoracic process. 3. Emphysema. 4. Multiple small pulmonary nodules measuring up to 4 mm. No follow-up needed if patient is low-risk (and has no known or suspected primary neoplasm). Non-contrast chest CT can be considered in 12 months if patient is high-risk. This recommendation follows the consensus statement: Guidelines for Management of Incidental Pulmonary Nodules Detected on CT Images: From the Fleischner Society 2017; Radiology 2017; 284:228-243. Aortic Atherosclerosis (ICD10-I70.0) and Emphysema (ICD10-J43.9). Electronically Signed   By: Narda Rutherford M.D.   On: 06/15/2021 20:55   DG Chest Port 1 View  Result Date: 06/15/2021 CLINICAL DATA:  Chest pain. EXAM: PORTABLE CHEST 1 VIEW COMPARISON:  None. FINDINGS: Trachea is midline. Heart size normal. Minimal streaky atelectasis or scarring in the medial right lung base. Lungs are otherwise clear. No pleural fluid. IMPRESSION: No acute findings. Electronically Signed   By: Leanna Battles M.D.   On: 06/15/2021 15:27   US Abdomen Limited RUQ (LIVER/GB)  Result Date: 06/15/2021 CLINICAL DATA:  Right upper quadrant pain EXAM: ULTRASOUND ABDOMEN LIMITED RIGHT UPPER QUADRANT COMPARISON:  None. FINDINGS:  Gallbladder: Gallbladder is well distended with multiple stones. No wall thickening or pericholecystic fluid is noted. Positive sonographic Murphy's sign is elicited. Common bile duct: Diameter: 3.7 mm. Liver: 6 mm simple cyst is noted in the left lobe of the liver. Diffuse increased echogenicity is noted consistent with fatty infiltration. Portal vein is patent on color Doppler imaging with normal direction of blood flow towards the liver. Other: None. IMPRESSION: Cholelithiasis with positive sonographic Murphy's sign suspicious for acute cholecystitis. HIDA scan may be helpful for further evaluation. Fatty liver. Electronically Signed   By: Alcide Clever M.D.   On: 06/15/2021 21:40    EKG: Independently reviewed.  Sinus rhythm, no prior tracing for comparison.  Assessment/Plan Principal  Problem:   Acute cholecystitis Active Problems:   Multiple sclerosis ([place])   Pure hypercholesterolemia   Chest pain   Elevated d-dimer   Acute cholecystitis Patient reports 2-week history of intermittent right upper quadrant abdominal pain.  No associated nausea or vomiting.  No fever or leukocytosis.  Bilirubin mildly elevated but remainder of LFTs normal.  On exam, right upper quadrant is tender to palpation with guarding.  Ultrasound showing cholelithiasis with positive sonographic Murphy sign suspicious for acute cholecystitis.  General surgery consulted and recommending obtaining HIDA scan. -Keep n.p.o. Ceftriaxone and Flagyl.  HIDA scan ordered.  Pain management, IV fluid hydration.  Right-sided chest pain Likely related to gallbladder problem.  ACS less likely as EKG without acute ischemic changes and high-sensitivity troponin negative x2.  CT angiogram negative for PE. -Continue pain management as mentioned above  Elevated D-dimer CT angiogram negative for PE. -Bilateral lower extremity Dopplers ordered to rule out DVT  Alzheimer's dementia -Delirium precautions  Hyperlipidemia -Continue  Zocor  Hypertension Stable.  Not on any antihypertensives. -Monitor blood pressure closely  MS Stable. -Outpatient neurology follow-up  Seizure disorder -Continue Keppra  Incidental pulmonary nodules CT showing multiple small pulmonary nodules measuring up to 4 mm.  Patient is high risk given history of cigarette smoking. -Radiologist recommending noncontrast chest CT in 12 months for follow-up.  Mood disorder -Continue Prozac  DVT prophylaxis: No chemical DVT prophylaxis at this time given concern for acute cholecystitis.  No SCDs given elevated D-dimer/lower extremity Dopplers pending. Code Status: Full code-discussed with the patient Family Communication: No family available at this time Disposition Plan: Status is: Inpatient  Remains inpatient appropriate because: If HIDA scan confirms acute cholecystitis, patient will need cholecystectomy.  Level of care:  Level of care: Telemetry Medical  The medical decision making on this patient was of high complexity and the patient is at high risk for clinical deterioration, therefore this is a level 3 visit.  Shela Leff MD [facility]  If 7PM-7AM, please contact night-coverage [email or web address]  06/16/2021, 5:22 AM

## 2021-06-16 NOTE — Care Management CC44 (Signed)
Condition Code 44 Documentation Completed  Patient Details  Name: Rasheda Ledger MRN: [id number] Date of Birth: 10-28-1965   Condition Code 44 given:  Yes Patient signature on Condition Code 44 notice:  Yes Documentation of 2 MD's agreement:  Yes Code 44 added to claim:  Yes    Oletta Cohn, RN 06/16/2021, 2:48 PM

## 2021-06-17 ENCOUNTER — Observation Stay (HOSPITAL_BASED_OUTPATIENT_CLINIC_OR_DEPARTMENT_OTHER): Payer: Medicare Other

## 2021-06-17 DIAGNOSIS — G309 Alzheimer's disease, unspecified: Secondary | ICD-10-CM | POA: Diagnosis not present

## 2021-06-17 DIAGNOSIS — Z20822 Contact with and (suspected) exposure to covid-19: Secondary | ICD-10-CM | POA: Diagnosis not present

## 2021-06-17 DIAGNOSIS — R0789 Other chest pain: Secondary | ICD-10-CM | POA: Diagnosis not present

## 2021-06-17 DIAGNOSIS — K810 Acute cholecystitis: Secondary | ICD-10-CM | POA: Diagnosis not present

## 2021-06-17 DIAGNOSIS — R079 Chest pain, unspecified: Secondary | ICD-10-CM

## 2021-06-17 LAB — CBC WITH DIFFERENTIAL/PLATELET
Abs Immature Granulocytes: 0.04 10*3/uL (ref 0.00–0.07)
Basophils Absolute: 0 10*3/uL (ref 0.0–0.1)
Basophils Relative: 1 %
Eosinophils Absolute: 0.2 10*3/uL (ref 0.0–0.5)
Eosinophils Relative: 3 %
HCT: 43 % (ref 36.0–46.0)
Hemoglobin: 13.8 g/dL (ref 12.0–15.0)
Immature Granulocytes: 1 %
Lymphocytes Relative: 17 %
Lymphs Abs: 1 10*3/uL (ref 0.7–4.0)
MCH: 29.3 pg (ref 26.0–34.0)
MCHC: 32.1 g/dL (ref 30.0–36.0)
MCV: 91.3 fL (ref 80.0–100.0)
Monocytes Absolute: 0.5 10*3/uL (ref 0.1–1.0)
Monocytes Relative: 8 %
Neutro Abs: 4.2 10*3/uL (ref 1.7–7.7)
Neutrophils Relative %: 70 %
Platelets: 184 10*3/uL (ref 150–400)
RBC: 4.71 MIL/uL (ref 3.87–5.11)
RDW: 13.9 % (ref 11.5–15.5)
WBC: 5.9 10*3/uL (ref 4.0–10.5)
nRBC: 0 % (ref 0.0–0.2)

## 2021-06-17 LAB — COMPREHENSIVE METABOLIC PANEL
ALT: 29 U/L (ref 0–44)
AST: 19 U/L (ref 15–41)
Albumin: 3.2 g/dL — ABNORMAL LOW (ref 3.5–5.0)
Alkaline Phosphatase: 96 U/L (ref 38–126)
Anion gap: 9 (ref 5–15)
BUN: <5 mg/dL — ABNORMAL LOW (ref 6–20)
CO2: 24 mmol/L (ref 22–32)
Calcium: 8.5 mg/dL — ABNORMAL LOW (ref 8.9–10.3)
Chloride: 107 mmol/L (ref 98–111)
Creatinine, Ser: 0.41 mg/dL — ABNORMAL LOW (ref 0.44–1.00)
GFR, Estimated: >60 mL/min (ref >60)
Glucose, Bld: 111 mg/dL — ABNORMAL HIGH (ref 70–99)
Potassium: 3.2 mmol/L — ABNORMAL LOW (ref 3.5–5.1)
Sodium: 140 mmol/L (ref 135–145)
Total Bilirubin: 1.4 mg/dL — ABNORMAL HIGH (ref 0.3–1.2)
Total Protein: 5.7 g/dL — ABNORMAL LOW (ref 6.5–8.1)

## 2021-06-17 LAB — ECHOCARDIOGRAM COMPLETE
AR max vel: 1.58 cm2
AV Area VTI: 1.66 cm2
AV Area mean vel: 1.54 cm2
AV Mean grad: 5 mmHg
AV Peak grad: 9.4 mmHg
Ao pk vel: 1.53 m/s
Area-P 1/2: 3.58 cm2
Height: 61 in
S' Lateral: 3 cm
Weight: 2880 oz

## 2021-06-17 LAB — TROPONIN I (HIGH SENSITIVITY): Troponin I (High Sensitivity): 3 ng/L (ref <18)

## 2021-06-17 LAB — MAGNESIUM: Magnesium: 1.9 mg/dL (ref 1.7–2.4)

## 2021-06-17 MED ORDER — POTASSIUM CHLORIDE 2 MEQ/ML IV SOLN
INTRAVENOUS | Status: DC
  Filled 2021-06-17 (×2): qty 1000

## 2021-06-17 MED ORDER — ALUM & MAG HYDROXIDE-SIMETH 200-200-20 MG/5ML PO SUSP: 30.0000 mL | Freq: Four times a day (QID) | ORAL | Status: DC | PRN

## 2021-06-17 MED ORDER — PERFLUTREN LIPID MICROSPHERE
1.0000 mL | INTRAVENOUS | Status: AC | PRN
Start: 2021-06-17 — End: 2021-06-17
  Administered 2021-06-17: 2 mL via INTRAVENOUS
  Filled 2021-06-17: qty 10

## 2021-06-17 MED ORDER — POTASSIUM CHLORIDE CRYS ER 20 MEQ PO TBCR
40.0000 meq | EXTENDED_RELEASE_TABLET | Freq: Once | ORAL | Status: AC
  Administered 2021-06-17: 40 meq via ORAL
  Filled 2021-06-17: qty 2

## 2021-06-17 MED ORDER — ALUM & MAG HYDROXIDE-SIMETH 200-200-20 MG/5ML PO SUSP
30.0000 mL | Freq: Three times a day (TID) | ORAL | Status: DC
  Administered 2021-06-17 (×2): 30 mL via ORAL
  Filled 2021-06-17 (×2): qty 30

## 2021-06-17 MED ORDER — PANTOPRAZOLE SODIUM 40 MG PO TBEC
40.0000 mg | DELAYED_RELEASE_TABLET | Freq: Two times a day (BID) | ORAL | Status: DC
  Administered 2021-06-17: 40 mg via ORAL
  Filled 2021-06-17: qty 1

## 2021-06-17 NOTE — Progress Notes (Signed)
PROGRESS NOTE                                                                                                                                                                                                             Patient Demographics:    Brandy Mckinney, is a 55 y.o. female, DOB - 1965-09-28, [id number]  Outpatient Primary MD for the patient is Glenis Smoker, MD    LOS - 1  Admit date - 06/15/2021    Chief Complaint  Patient presents with   Chest Pain       Brief Narrative (HPI from H&P)  -  Brandy Mckinney is a 55 y.o. female with medical history significant of Alzheimer's dementia, hyperlipidemia, hypertension, MS - bedbound at baseline, seizure disorder presented to the ED complaining of right-sided chest pain, work-up in the ER was suspicious for acute cholecystitis she was seen by general surgery and admitted for further work-up.   Subjective:   Patient in bed denies any headache, still has some substernal discomfort, no abdominal pain, no shortness of breath, no focal weakness.   Assessment  & Plan :     Nonspecific right-sided chest discomfort.  Work-up in the ER suggestive of possible cholecystitis.  Ultrasound suspicious for positive Murphy sign, currently exam and labs are stable, stable HIDA scan no further work-up per general surgery, still complaining of some substernal discomfort will obtain echocardiogram to rule out wall motion abnormality, EKG and troponin stable..  2.  Multiple bilateral lung nodules.  Outpatient follow-up with pulmonary within a week of discharge needs repeat CT scan in a few months.  Must follow with pulmonary.  No PE on CTA.  3.  Advanced Alzheimer's dementia.  At risk for delirium, supportive care.  4.  History of seizures.  Continue Keppra.  5.  Underlying MS.  Baseline bedbound.  Supportive care.  6.  Dyslipidemia.  On statin.  7.  Mildly elevated  D-dimer.  CTA & Leg [place] negative.      Condition - Fair  Family Communication  : Called brother Marcello Moores [phone]  06/14/2021 at 9:17 AM and message left, updated in the hospital 06/17/21  Code Status :  Full  Consults  :  CCS  PUD Prophylaxis :    Procedures  :     TTE  CTA - 1. No pulmonary  embolus. 2. Moderate lower lobe atelectasis. No other acute intrathoracic process. 3. Emphysema. 4. Multiple small pulmonary nodules measuring up to 4 mm. No follow-up needed if patient is low-risk (and has no known or suspected primary neoplasm). Non-contrast chest CT can be considered in 12 months.  [place] - Cholelithiasis with positive sonographic Murphy's sign suspicious for acute cholecystitis  HIDA - non acute  Leg V. Ultrasound.      Disposition Plan  :    Status is: Inpatient  Remains inpatient appropriate because: Work-up for possible acute cholecystitis  DVT Prophylaxis  :  Heparin heparin injection 5,000 Units Start: 06/16/21 1400   Lab Results  Component Value Date   PLT 184 06/17/2021    Diet :  Diet Order             DIET SOFT Room service appropriate? Yes; Fluid consistency: Thin  Diet effective now                    Inpatient Medications  Scheduled Meds:  alum & mag hydroxide-simeth  30 mL Oral TID   FLUoxetine  20 mg Oral Daily   gabapentin  300 mg Oral TID   heparin injection (subcutaneous)  5,000 Units Subcutaneous Q8H   levETIRAcetam  750 mg Oral BID   pantoprazole  40 mg Oral BID AC   simvastatin  20 mg Oral QPM   Continuous Infusions:  lactated ringers with kcl 75 mL/hr at 06/17/21 0935   PRN Meds:.acetaminophen **OR** acetaminophen, morphine injection  Antibiotics  :    Anti-infectives (From admission, onward)    Start     Dose/Rate Route Frequency Ordered Stop   06/16/21 0600  cefTRIAXone (ROCEPHIN) 2 g in sodium chloride 0.9 % 100 mL IVPB  Status:  Discontinued        2 g 200 mL/hr over 30 Minutes Intravenous Every 24 hours  06/16/21 0520 06/17/21 0947   06/16/21 0530  metroNIDAZOLE (FLAGYL) IVPB 500 mg  Status:  Discontinued        500 mg 100 mL/hr over 60 Minutes Intravenous Every 8 hours 06/16/21 0521 06/17/21 0947        Time Spent in minutes  30   Lala Lund M.D on 06/17/2021 at 11:35 AM  To page go to [email or web address]   [facility] -  Office  [phone]  See all Orders from today for further details    Objective:   Vitals:   06/16/21 1601 06/16/21 2040 06/17/21 0454 06/17/21 0744  BP: 135/78 117/88 124/64 (!) 139/96  Pulse: 84 84 76 77  Resp: 17 19 18 16   Temp: 98.2 F (36.8 C) 97.8 F (36.6 C) 98 F (36.7 C) 97.7 F (36.5 C)  TempSrc: Oral Oral  Oral  SpO2: 100% 100% 98% 99%  Weight:      Height:        Wt Readings from Last 3 Encounters:  06/15/21 81.6 kg     Intake/Output Summary (Last 24 hours) at 06/17/2021 1135 Last data filed at 06/17/2021 [id number] Gross per 24 hour  Intake 550 ml  Output 900 ml  Net -350 ml     Physical Exam  Awake Alert x1, No new F.N deficits,   [place].AT,PERRAL Supple Neck, No JVD,   Symmetrical Chest wall movement, Good air movement bilaterally, CTAB RRR,No Gallops, Rubs or new Murmurs,  +ve B.Sounds, Abd Soft, No tenderness,   No Cyanosis, Clubbing or edema       Data Review:  CBC Recent Labs  Lab 06/15/21 1407 06/16/21 0718 06/17/21 0047  WBC 7.5 7.8 5.9  HGB 15.4* 14.2 13.8  HCT 49.2* 45.1 43.0  PLT 201 195 184  MCV 93.0 94.0 91.3  MCH 29.1 29.6 29.3  MCHC 31.3 31.5 32.1  RDW 14.1 14.2 13.9  LYMPHSABS  --   --  1.0  MONOABS  --   --  0.5  EOSABS  --   --  0.2  BASOSABS  --   --  0.0    Electrolytes Recent Labs  Lab 06/15/21 1407 06/15/21 1813 06/15/21 2158 06/16/21 0718 06/16/21 0730 06/17/21 0047  NA 141  --   --  139  --  140  K 3.6  --   --  3.7  --  3.2*  CL 99  --   --  103  --  107  CO2 29  --   --  27  --  24  GLUCOSE 110*  --   --  142*  --  111*  BUN 6  --   --  9  --  <5*  CREATININE  0.51  --   --  0.60  --  0.41*  CALCIUM 9.1  --   --  8.6*  --  8.5*  AST  --   --  23 20  --  19  ALT  --   --  33 32  --  29  ALKPHOS  --   --  107 118  --  96  BILITOT  --   --  1.8* 0.7  --  1.4*  ALBUMIN  --   --  3.4* 3.1*  --  3.2*  MG  --   --   --  2.0  --  1.9  DDIMER  --  1.52*  --   --   --   --   INR  --   --   --  1.0  --   --   BNP  --   --   --   --  7.9  --     ------------------------------------------------------------------------------------------------------------------ No results for input(s): CHOL, HDL, LDLCALC, TRIG, CHOLHDL, LDLDIRECT in the last 72 hours.  No results found for: HGBA1C  No results for input(s): TSH, T4TOTAL, T3FREE, THYROIDAB in the last 72 hours.  Invalid input(s): FREET3 ------------------------------------------------------------------------------------------------------------------ ID Labs Recent Labs  Lab 06/15/21 1407 06/15/21 1813 06/16/21 0718 06/17/21 0047  WBC 7.5  --  7.8 5.9  PLT 201  --  195 184  DDIMER  --  1.52*  --   --   CREATININE 0.51  --  0.60 0.41*   Cardiac Enzymes No results for input(s): CKMB, TROPONINI, MYOGLOBIN in the last 168 hours.  Invalid input(s): CK    Radiology Reports CT Angio Chest PE W and/or Wo Contrast  Result Date: 06/15/2021 CLINICAL DATA:  PE suspected, low/intermediate prob, positive D-dimer Chest pain. EXAM: CT ANGIOGRAPHY CHEST WITH CONTRAST TECHNIQUE: Multidetector CT imaging of the chest was performed using the standard protocol during bolus administration of intravenous contrast. Multiplanar CT image reconstructions and MIPs were obtained to evaluate the vascular anatomy. CONTRAST:  23mL OMNIPAQUE IOHEXOL 350 MG/ML SOLN COMPARISON:  Radiograph earlier today. FINDINGS: Cardiovascular: There are no filling defects within the pulmonary arteries to suggest pulmonary embolus. Thoracic aorta is normal in caliber. No aortic dissection or acute aortic finding. The heart is normal in size.  Trace pericardial effusion. Occasional coronary artery calcifications. Mediastinum/Nodes: Scattered small mediastinal and  hilar lymph nodes, not enlarged by size criteria. Tiny hiatal hernia. No suspicious thyroid nodule. Lungs/Pleura: Breathing motion artifact limits detailed parenchymal assessment moderate emphysema with bronchial thickening. Dependent atelectasis in the right lower lobe. No evidence of pneumonia or confluent consolidation. There is mild lower lobe bronchiectasis dependently. No pleural fluid. No findings of pulmonary edema. Multiple small pulmonary nodules. 3 mm right upper lobe nodule series 6, image 21. 4 mm right upper lobe nodule series 6, image 24. 2 mm right upper lobe nodule series 6, image 36. Trachea and central bronchi are patent. Upper Abdomen: No acute or unexpected findings. Musculoskeletal: There are no acute or suspicious osseous abnormalities. No chest wall soft tissue abnormality. Review of the MIP images confirms the above findings. IMPRESSION: 1. No pulmonary embolus. 2. Moderate lower lobe atelectasis. No other acute intrathoracic process. 3. Emphysema. 4. Multiple small pulmonary nodules measuring up to 4 mm. No follow-up needed if patient is low-risk (and has no known or suspected primary neoplasm). Non-contrast chest CT can be considered in 12 months if patient is high-risk. This recommendation follows the consensus statement: Guidelines for Management of Incidental Pulmonary Nodules Detected on CT Images: From the Fleischner Society 2017; Radiology 2017; 284:228-243. Aortic Atherosclerosis (ICD10-I70.0) and Emphysema (ICD10-J43.9). Electronically Signed   By: Narda Rutherford M.D.   On: 06/15/2021 20:55   NM Hepato W/EF  Result Date: 06/16/2021 CLINICAL DATA:  Right upper quadrant abdominal pain. EXAM: NUCLEAR MEDICINE HEPATOBILIARY IMAGING WITH GALLBLADDER EF TECHNIQUE: Sequential images of the abdomen were obtained out to 60 minutes following intravenous  administration of radiopharmaceutical. After oral ingestion of Ensure, gallbladder ejection fraction was determined. At 60 min, normal ejection fraction is greater than 33%. RADIOPHARMACEUTICALS:  5.4 mCi Tc-6m  Choletec IV COMPARISON:  June 15, 2021. FINDINGS: Prompt uptake and biliary excretion of activity by the liver is seen. Gallbladder activity is visualized, consistent with patency of cystic duct. Biliary activity passes into small bowel, consistent with patent common bile duct. Calculated gallbladder ejection fraction is 45%. (Normal gallbladder ejection fraction with Ensure is greater than 33%.) Patient reported no pain after Ensure administration. IMPRESSION: Normal gallbladder ejection fraction is noted after Ensure administration. Electronically Signed   By: Lupita Raider M.D.   On: 06/16/2021 13:30   DG Chest Port 1 View  Result Date: 06/15/2021 CLINICAL DATA:  Chest pain. EXAM: PORTABLE CHEST 1 VIEW COMPARISON:  None. FINDINGS: Trachea is midline. Heart size normal. Minimal streaky atelectasis or scarring in the medial right lung base. Lungs are otherwise clear. No pleural fluid. IMPRESSION: No acute findings. Electronically Signed   By: Leanna Battles M.D.   On: 06/15/2021 15:27   VAS [place] LOWER EXTREMITY VENOUS (DVT)  Result Date: 06/16/2021  Lower Venous DVT Study Patient Name:  Brandy Mckinney  Date of Exam:   06/16/2021 Medical Rec #: [id number]            Accession #:    [id number] Date of Birth: November 01, 1965            Patient Gender: F Patient Age:   41 years Exam Location:  [facility] Procedure:      VAS [place] LOWER EXTREMITY VENOUS (DVT) Referring Phys: Ulyess Blossom RATHORE --------------------------------------------------------------------------------  Indications: D-dimer.  Comparison Study: No prior studies. Performing Technologist: Jean Rosenthal RDMS, RVT  Examination Guidelines: A complete evaluation includes B-mode imaging, spectral Doppler, color Doppler, and power  Doppler as needed of all accessible portions of each vessel. Bilateral testing is considered an integral part of  a complete examination. Limited examinations for reoccurring indications may be performed as noted. The reflux portion of the exam is performed with the patient in reverse Trendelenburg.  +---------+---------------+---------+-----------+----------+--------------+ RIGHT    CompressibilityPhasicitySpontaneityPropertiesThrombus Aging +---------+---------------+---------+-----------+----------+--------------+ CFV      Full           Yes      Yes                                 +---------+---------------+---------+-----------+----------+--------------+ SFJ      Full                                                        +---------+---------------+---------+-----------+----------+--------------+ FV Prox  Full                                                        +---------+---------------+---------+-----------+----------+--------------+ FV Mid   Full                                                        +---------+---------------+---------+-----------+----------+--------------+ FV DistalFull                                                        +---------+---------------+---------+-----------+----------+--------------+ PFV      Full                                                        +---------+---------------+---------+-----------+----------+--------------+ POP      Full           Yes      Yes                                 +---------+---------------+---------+-----------+----------+--------------+ PTV      Full                                                        +---------+---------------+---------+-----------+----------+--------------+ PERO     Full                                                        +---------+---------------+---------+-----------+----------+--------------+    +---------+---------------+---------+-----------+----------+--------------+ LEFT     CompressibilityPhasicitySpontaneityPropertiesThrombus Aging +---------+---------------+---------+-----------+----------+--------------+ CFV      Full  Yes      Yes                                 +---------+---------------+---------+-----------+----------+--------------+ SFJ      Full                                                        +---------+---------------+---------+-----------+----------+--------------+ FV Prox  Full                                                        +---------+---------------+---------+-----------+----------+--------------+ FV Mid   Full                                                        +---------+---------------+---------+-----------+----------+--------------+ FV DistalFull                                                        +---------+---------------+---------+-----------+----------+--------------+ PFV      Full                                                        +---------+---------------+---------+-----------+----------+--------------+ POP      Full           Yes      Yes                                 +---------+---------------+---------+-----------+----------+--------------+ PTV      Full                                                        +---------+---------------+---------+-----------+----------+--------------+ PERO     Full                                                        +---------+---------------+---------+-----------+----------+--------------+     Summary: RIGHT: - There is no evidence of deep vein thrombosis in the lower extremity.  - No cystic structure found in the popliteal fossa.  LEFT: - There is no evidence of deep vein thrombosis in the lower extremity.  - No cystic structure found in the popliteal fossa.  *See table(s) above for measurements and observations. Electronically signed  by Deitra Mayo MD on 06/16/2021 at 4:50:47  PM.    Final    US Abdomen Limited RUQ (LIVER/GB)  Result Date: 06/15/2021 CLINICAL DATA:  Right upper quadrant pain EXAM: ULTRASOUND ABDOMEN LIMITED RIGHT UPPER QUADRANT COMPARISON:  None. FINDINGS: Gallbladder: Gallbladder is well distended with multiple stones. No wall thickening or pericholecystic fluid is noted. Positive sonographic Murphy's sign is elicited. Common bile duct: Diameter: 3.7 mm. Liver: 6 mm simple cyst is noted in the left lobe of the liver. Diffuse increased echogenicity is noted consistent with fatty infiltration. Portal vein is patent on color Doppler imaging with normal direction of blood flow towards the liver. Other: None. IMPRESSION: Cholelithiasis with positive sonographic Murphy's sign suspicious for acute cholecystitis. HIDA scan may be helpful for further evaluation. Fatty liver. Electronically Signed   By: Inez Catalina M.D.   On: 06/15/2021 21:40

## 2021-06-17 NOTE — Plan of Care (Signed)
Patient ID: Krysten Veronica, female   DOB: Nov 23, 1965, 55 y.o.   MRN: [id number]  Problem: Education: Goal: Knowledge of General Education information will improve Description: Including pain rating scale, medication(s)/side effects and non-pharmacologic comfort measures Outcome: Adequate for Discharge   Problem: Health Behavior/Discharge Planning: Goal: Ability to manage health-related needs will improve Outcome: Adequate for Discharge   Problem: Clinical Measurements: Goal: Ability to maintain clinical measurements within normal limits will improve Outcome: Adequate for Discharge Goal: Will remain free from infection Outcome: Adequate for Discharge Goal: Diagnostic test results will improve Outcome: Adequate for Discharge Goal: Respiratory complications will improve Outcome: Adequate for Discharge Goal: Cardiovascular complication will be avoided Outcome: Adequate for Discharge   Problem: Activity: Goal: Risk for activity intolerance will decrease Outcome: Adequate for Discharge   Problem: Nutrition: Goal: Adequate nutrition will be maintained Outcome: Adequate for Discharge   Problem: Coping: Goal: Level of anxiety will decrease Outcome: Adequate for Discharge   Problem: Elimination: Goal: Will not experience complications related to bowel motility Outcome: Adequate for Discharge Goal: Will not experience complications related to urinary retention Outcome: Adequate for Discharge   Problem: Pain Managment: Goal: General experience of comfort will improve Outcome: Adequate for Discharge   Problem: Safety: Goal: Ability to remain free from injury will improve Outcome: Adequate for Discharge   Problem: Skin Integrity: Goal: Risk for impaired skin integrity will decrease Outcome: Adequate for Discharge   Lidia Collum, RN

## 2021-06-17 NOTE — Discharge Summary (Signed)
Brandy Mckinney WUJ:[id number] DOB: 06/17/1966 DOA: 06/15/2021  PCP: Shon Hale, MD  Admit date: 06/15/2021  Discharge date: 06/17/2021  Admitted From: Home   Disposition:  Home   Recommendations for Outpatient Follow-up:   Follow up with PCP in 1-2 weeks  PCP Please obtain BMP/CBC, 2 view CXR in 1week,  (see Discharge instructions)   PCP Please follow up on the following pending results:    Home Health: None   Equipment/Devices: None  Consultations: Surgery Discharge Condition: Stable    CODE STATUS: Full    Diet Recommendation: Heart Healthy   Diet Order             DIET SOFT Room service appropriate? Yes; Fluid consistency: Thin  Diet effective now           Diet - low sodium heart healthy                    Chief Complaint  Patient presents with   Chest Pain     Brief history of present illness from the day of admission and additional interim summary    Brandy Mckinney is a 55 y.o. female with medical history significant of Alzheimer's dementia, hyperlipidemia, hypertension, MS - bedbound at baseline, seizure disorder presented to the ED complaining of right-sided chest pain, work-up in the ER was suspicious for acute cholecystitis she was seen by general surgery and admitted for further work-up.                                                                 Hospital Course     Nonspecific right-sided chest discomfort.  Work-up in the ER suggestive of possible cholecystitis.  Ultrasound was suspicious for positive Murphy sign, currently exam and labs are stable, stable HIDA scan no further work-up per general surgery, also stable echocardiogram ruled out wall motion abnormality stable EF, EKG and troponin stable. Pain resolved ? Mild intermittent Biliary Colic outpt [facility]  follow up, seen by [facility] here.   2.  Multiple bilateral lung nodules.  Outpatient follow-up with pulmonary within a week of discharge needs repeat CT scan in a few months.  Must follow with pulmonary.  No PE on CTA.   3.  Advanced Alzheimer's dementia.  At risk for delirium, supportive care.   4.  History of seizures.  Continue Keppra.   5.  Underlying MS.  Baseline bedbound.  Supportive care.   6.  Dyslipidemia.  On statin.   7.  Mildly elevated D-dimer.  CTA & Leg [place] negative.   Discharge diagnosis     Principal Problem:   Acute cholecystitis Active Problems:   Multiple sclerosis (HCC)   Pure hypercholesterolemia   Chest pain   Elevated d-dimer    Discharge instructions    Discharge Instructions  Diet - low sodium heart healthy   Complete by: As directed    Discharge instructions   Complete by: As directed    Follow with Primary MD Glenis Smoker, MD in 7 days   Get CBC, CMP, 2 view Chest X ray -  checked next visit within 1 week by Primary MD  Activity: As tolerated with Full fall precautions use walker/cane & assistance as needed  Disposition Home   Diet: Heart Healthy   Special Instructions: If you have smoked or chewed Tobacco  in the last 2 yrs please stop smoking, stop any regular Alcohol  and or any Recreational drug use.  On your next visit with your primary care physician please Get Medicines reviewed and adjusted.  Please request your Prim.MD to go over all Hospital Tests and Procedure/Radiological results at the follow up, please get all Hospital records sent to your Prim MD by signing hospital release before you go home.  If you experience worsening of your admission symptoms, develop shortness of breath, life threatening emergency, suicidal or homicidal thoughts you must seek medical attention immediately by calling 911 or calling your MD immediately  if symptoms less severe.  You Must read complete instructions/literature along with all  the possible adverse reactions/side effects for all the Medicines you take and that have been prescribed to you. Take any new Medicines after you have completely understood and accpet all the possible adverse reactions/side effects.   Increase activity slowly   Complete by: As directed        Discharge Medications   Allergies as of 06/17/2021       Reactions   Fentanyl    Other reaction(s): can not have anything on her skin        Medication List     TAKE these medications    calcium citrate-vitamin D 315-200 MG-UNIT tablet Commonly known as: CITRACAL+D Take 1 tablet by mouth daily.   Cranberry 500 MG Tabs Take 1 tablet by mouth daily.   cyclobenzaprine 10 MG tablet Commonly known as: FLEXERIL Take 10 mg by mouth 3 (three) times daily as needed for muscle spasms.   etodolac 400 MG tablet Commonly known as: LODINE TAKE 1 TABLET BY MOUTH TWICE A DAY AS NEEDED What changed: reasons to take this   FIBER CHOICE PO Take 1 tablet by mouth daily.   FLUoxetine 20 MG capsule Commonly known as: PROZAC Take 20 mg by mouth daily.   gabapentin 300 MG capsule Commonly known as: NEURONTIN Take 1 capsule (300 mg total) by mouth 3 (three) times daily.   levETIRAcetam 750 MG tablet Commonly known as: KEPPRA Take 1 tablet (750 mg total) by mouth 2 (two) times daily.   omeprazole 20 MG capsule Commonly known as: PRILOSEC Take 20 mg by mouth daily as needed (heartburn).   simvastatin 20 MG tablet Commonly known as: ZOCOR Take 20 mg by mouth every evening.   vitamin C 100 MG tablet Take 100 mg by mouth daily.         Follow-up Information     Glenis Smoker, MD. Schedule an appointment as soon as possible for a visit today.   Specialty: Family Medicine Contact information: [place] [id number] [address] .   Specialty: Emergency Medicine Why: If symptoms  worsen Contact information: [address] [place] [phone]  [facility], [place]. Schedule an appointment as soon as possible for a visit in 1 week(s).   Specialty: General Surgery Contact information: [address] [place] [phone]                Major procedures and Radiology Reports - PLEASE review detailed and final reports thoroughly  -      CT Angio Chest PE W and/or Wo Contrast  Result Date: 06/15/2021 CLINICAL DATA:  PE suspected, low/intermediate prob, positive D-dimer Chest pain. EXAM: CT ANGIOGRAPHY CHEST WITH CONTRAST TECHNIQUE: Multidetector CT imaging of the chest was performed using the standard protocol during bolus administration of intravenous contrast. Multiplanar CT image reconstructions and MIPs were obtained to evaluate the vascular anatomy. CONTRAST:  29mL OMNIPAQUE IOHEXOL 350 MG/ML SOLN COMPARISON:  Radiograph earlier today. FINDINGS: Cardiovascular: There are no filling defects within the pulmonary arteries to suggest pulmonary embolus. Thoracic aorta is normal in caliber. No aortic dissection or acute aortic finding. The heart is normal in size. Trace pericardial effusion. Occasional coronary artery calcifications. Mediastinum/Nodes: Scattered small mediastinal and hilar lymph nodes, not enlarged by size criteria. Tiny hiatal hernia. No suspicious thyroid nodule. Lungs/Pleura: Breathing motion artifact limits detailed parenchymal assessment moderate emphysema with bronchial thickening. Dependent atelectasis in the right lower lobe. No evidence of pneumonia or confluent consolidation. There is mild lower lobe bronchiectasis dependently. No pleural fluid. No findings of pulmonary edema. Multiple small pulmonary nodules. 3 mm right upper lobe nodule series 6, image 21. 4 mm right upper lobe nodule series 6, image 24. 2 mm right upper lobe nodule  series 6, image 36. Trachea and central bronchi are patent. Upper Abdomen: No acute or unexpected findings. Musculoskeletal: There are no acute or suspicious osseous abnormalities. No chest wall soft tissue abnormality. Review of the MIP images confirms the above findings. IMPRESSION: 1. No pulmonary embolus. 2. Moderate lower lobe atelectasis. No other acute intrathoracic process. 3. Emphysema. 4. Multiple small pulmonary nodules measuring up to 4 mm. No follow-up needed if patient is low-risk (and has no known or suspected primary neoplasm). Non-contrast chest CT can be considered in 12 months if patient is high-risk. This recommendation follows the consensus statement: Guidelines for Management of Incidental Pulmonary Nodules Detected on CT Images: From the Fleischner Society 2017; Radiology 2017; 284:228-243. Aortic Atherosclerosis (ICD10-I70.0) and Emphysema (ICD10-J43.9). Electronically Signed   By: Keith Rake M.D.   On: 06/15/2021 20:55   NM Hepato W/EF  Result Date: 06/16/2021 CLINICAL DATA:  Right upper quadrant abdominal pain. EXAM: NUCLEAR MEDICINE HEPATOBILIARY IMAGING WITH GALLBLADDER EF TECHNIQUE: Sequential images of the abdomen were obtained out to 60 minutes following intravenous administration of radiopharmaceutical. After oral ingestion of Ensure, gallbladder ejection fraction was determined. At 60 min, normal ejection fraction is greater than 33%. RADIOPHARMACEUTICALS:  5.4 mCi Tc-24m  Choletec IV COMPARISON:  June 15, 2021. FINDINGS: Prompt uptake and biliary excretion of activity by the liver is seen. Gallbladder activity is visualized, consistent with patency of cystic duct. Biliary activity passes into small bowel, consistent with patent common bile duct. Calculated gallbladder ejection fraction is 45%. (Normal gallbladder ejection fraction with Ensure is greater than 33%.) Patient reported no pain after Ensure administration. IMPRESSION: Normal gallbladder ejection fraction is  noted after Ensure administration. Electronically Signed   By: Marijo Conception M.D.   On: 06/16/2021 13:30   DG Chest Port 1 View  Result Date: 06/15/2021 CLINICAL DATA:  Chest pain. EXAM: PORTABLE CHEST 1 VIEW COMPARISON:  None. FINDINGS: Trachea is midline. Heart size normal. Minimal streaky atelectasis or scarring in the medial right lung base. Lungs are otherwise clear. No pleural fluid. IMPRESSION: No acute findings. Electronically Signed   By: Lorin Picket M.D.   On: 06/15/2021 15:27   ECHOCARDIOGRAM COMPLETE  Result Date: 06/17/2021    ECHOCARDIOGRAM REPORT   Patient Name:   KELSHA BURFORD Date of Exam: 06/17/2021 Medical Rec #:  [id number]           Height:       61.0 in Accession #:    [id number]          Weight:       180.0 lb Date of Birth:  1966/01/03           BSA:          1.806 m Patient Age:    55 years            BP:           117/88 mmHg Patient Gender: F                   HR:           84 bpm. Exam Location:  Inpatient Procedure: 2D Echo, Cardiac Doppler, Color Doppler and Intracardiac            Opacification Agent Indications:    CM- hypertrophic  History:        Patient has no prior history of Echocardiogram examinations.                 Signs/Symptoms:Chest Pain.  Sonographer:    Glo Herring Referring Phys: [place]  1. Left ventricular ejection fraction, by estimation, is 60 to 65%. The left ventricle has normal function. The left ventricle has no regional wall motion abnormalities. There is mild left ventricular hypertrophy. Left ventricular diastolic parameters are indeterminate.  2. Right ventricular systolic function is normal. The right ventricular size is normal. Tricuspid regurgitation signal is inadequate for assessing PA pressure.  3. The mitral valve is normal in structure. Trivial mitral valve regurgitation. No evidence of mitral stenosis.  4. The aortic valve was not well visualized. Aortic valve regurgitation is not visualized. No  aortic stenosis is present.  5. The inferior vena cava is normal in size with greater than 50% respiratory variability, suggesting right atrial pressure of 3 mmHg. FINDINGS  Left Ventricle: Left ventricular ejection fraction, by estimation, is 60 to 65%. The left ventricle has normal function. The left ventricle has no regional wall motion abnormalities. The left ventricular internal cavity size was normal in size. There is  mild left ventricular hypertrophy. Left ventricular diastolic parameters are indeterminate. Right Ventricle: The right ventricular size is normal. No increase in right ventricular wall thickness. Right ventricular systolic function is normal. Tricuspid regurgitation signal is inadequate for assessing PA pressure. Left Atrium: Left atrial size was normal in size. Right Atrium: Right atrial size was normal in size. Pericardium: There is no evidence of pericardial effusion. Mitral Valve: The mitral valve is normal in structure. Trivial mitral valve regurgitation. No evidence of mitral valve stenosis. Tricuspid Valve: The tricuspid valve is normal in structure. Tricuspid valve regurgitation is not demonstrated. Aortic Valve: The aortic valve was not well visualized. Aortic valve regurgitation is not visualized. No aortic stenosis is present. Aortic valve mean gradient measures 5.0 mmHg. Aortic valve peak gradient measures 9.4 mmHg. Aortic valve area, by VTI measures 1.66 cm. Pulmonic Valve:  The pulmonic valve was not well visualized. Pulmonic valve regurgitation is not visualized. Aorta: The aortic root is normal in size and structure. Venous: The inferior vena cava is normal in size with greater than 50% respiratory variability, suggesting right atrial pressure of 3 mmHg. IAS/Shunts: The interatrial septum was not well visualized.  LEFT VENTRICLE PLAX 2D LVIDd:         4.30 cm   Diastology LVIDs:         3.00 cm   LV e' medial:    5.98 cm/s LV PW:         1.00 cm   LV E/e' medial:  10.3 LV IVS:         1.00 cm   LV e' lateral:   6.42 cm/s LVOT diam:     2.10 cm   LV E/e' lateral: 9.6 LV SV:         50 LV SV Index:   28 LVOT Area:     3.46 cm  RIGHT VENTRICLE          IVC RV Basal diam:  3.50 cm  IVC diam: 1.40 cm LEFT ATRIUM             Index        RIGHT ATRIUM           Index LA diam:        3.10 cm 1.72 cm/m   RA Area:     13.90 cm LA Vol (A2C):   31.7 ml 17.55 ml/m  RA Volume:   31.40 ml  17.39 ml/m LA Vol (A4C):   31.9 ml 17.66 ml/m LA Biplane Vol: 34.0 ml 18.83 ml/m  AORTIC VALVE                     PULMONIC VALVE AV Area (Vmax):    1.58 cm      PV Vmax:       0.82 m/s AV Area (Vmean):   1.54 cm      PV Peak grad:  2.7 mmHg AV Area (VTI):     1.66 cm AV Vmax:           153.00 cm/s AV Vmean:          109.000 cm/s AV VTI:            0.302 m AV Peak Grad:      9.4 mmHg AV Mean Grad:      5.0 mmHg LVOT Vmax:         69.80 cm/s LVOT Vmean:        48.500 cm/s LVOT VTI:          0.145 m LVOT/AV VTI ratio: 0.48  AORTA Ao Root diam: 2.80 cm MITRAL VALVE MV Area (PHT): 3.58 cm    SHUNTS MV Decel Time: 212 msec    Systemic VTI:  0.14 m MV E velocity: 61.70 cm/s  Systemic Diam: 2.10 cm MV A velocity: 63.40 cm/s MV E/A ratio:  0.97 Oswaldo Milian MD Electronically signed by Oswaldo Milian MD Signature Date/Time: 06/17/2021/4:39:30 PM    Final    VAS [place] LOWER EXTREMITY VENOUS (DVT)  Result Date: 06/16/2021  Lower Venous DVT Study Patient Name:  KERMIT MOTHERSHED  Date of Exam:   06/16/2021 Medical Rec #: [id number]            Accession #:    [id number] Date of Birth: [birth year]  Patient Gender: F Patient Age:   88 years Exam Location:  [facility] Procedure:      VAS [place] LOWER EXTREMITY VENOUS (DVT) Referring Phys: Wandra Feinstein RATHORE --------------------------------------------------------------------------------  Indications: D-dimer.  Comparison Study: No prior studies. Performing Technologist: Darlin Coco RDMS, RVT  Examination Guidelines: A complete evaluation includes  B-mode imaging, spectral Doppler, color Doppler, and power Doppler as needed of all accessible portions of each vessel. Bilateral testing is considered an integral part of a complete examination. Limited examinations for reoccurring indications may be performed as noted. The reflux portion of the exam is performed with the patient in reverse Trendelenburg.  +---------+---------------+---------+-----------+----------+--------------+ RIGHT    CompressibilityPhasicitySpontaneityPropertiesThrombus Aging +---------+---------------+---------+-----------+----------+--------------+ CFV      Full           Yes      Yes                                 +---------+---------------+---------+-----------+----------+--------------+ SFJ      Full                                                        +---------+---------------+---------+-----------+----------+--------------+ FV Prox  Full                                                        +---------+---------------+---------+-----------+----------+--------------+ FV Mid   Full                                                        +---------+---------------+---------+-----------+----------+--------------+ FV DistalFull                                                        +---------+---------------+---------+-----------+----------+--------------+ PFV      Full                                                        +---------+---------------+---------+-----------+----------+--------------+ POP      Full           Yes      Yes                                 +---------+---------------+---------+-----------+----------+--------------+ PTV      Full                                                        +---------+---------------+---------+-----------+----------+--------------+ PERO  Full                                                         +---------+---------------+---------+-----------+----------+--------------+   +---------+---------------+---------+-----------+----------+--------------+ LEFT     CompressibilityPhasicitySpontaneityPropertiesThrombus Aging +---------+---------------+---------+-----------+----------+--------------+ CFV      Full           Yes      Yes                                 +---------+---------------+---------+-----------+----------+--------------+ SFJ      Full                                                        +---------+---------------+---------+-----------+----------+--------------+ FV Prox  Full                                                        +---------+---------------+---------+-----------+----------+--------------+ FV Mid   Full                                                        +---------+---------------+---------+-----------+----------+--------------+ FV DistalFull                                                        +---------+---------------+---------+-----------+----------+--------------+ PFV      Full                                                        +---------+---------------+---------+-----------+----------+--------------+ POP      Full           Yes      Yes                                 +---------+---------------+---------+-----------+----------+--------------+ PTV      Full                                                        +---------+---------------+---------+-----------+----------+--------------+ PERO     Full                                                        +---------+---------------+---------+-----------+----------+--------------+  Summary: RIGHT: - There is no evidence of deep vein thrombosis in the lower extremity.  - No cystic structure found in the popliteal fossa.  LEFT: - There is no evidence of deep vein thrombosis in the lower extremity.  - No cystic structure found in the popliteal fossa.   *See table(s) above for measurements and observations. Electronically signed by Deitra Mayo MD on 06/16/2021 at 4:50:47 PM.    Final    US Abdomen Limited RUQ (LIVER/GB)  Result Date: 06/15/2021 CLINICAL DATA:  Right upper quadrant pain EXAM: ULTRASOUND ABDOMEN LIMITED RIGHT UPPER QUADRANT COMPARISON:  None. FINDINGS: Gallbladder: Gallbladder is well distended with multiple stones. No wall thickening or pericholecystic fluid is noted. Positive sonographic Murphy's sign is elicited. Common bile duct: Diameter: 3.7 mm. Liver: 6 mm simple cyst is noted in the left lobe of the liver. Diffuse increased echogenicity is noted consistent with fatty infiltration. Portal vein is patent on color Doppler imaging with normal direction of blood flow towards the liver. Other: None. IMPRESSION: Cholelithiasis with positive sonographic Murphy's sign suspicious for acute cholecystitis. HIDA scan may be helpful for further evaluation. Fatty liver. Electronically Signed   By: Inez Catalina M.D.   On: 06/15/2021 21:40       Today   Subjective    Leveah Ratterman today has no headache,no chest abdominal pain,no new weakness tingling or numbness, feels much better wants to go home today.    Objective   Blood pressure (!) 159/83, pulse 83, temperature 98.4 F (36.9 C), temperature source Oral, resp. rate 16, height 5\' 1"  (1.549 m), weight 81.6 kg, SpO2 99 %.   Intake/Output Summary (Last 24 hours) at 06/17/2021 1825 Last data filed at 06/17/2021 1805 Gross per 24 hour  Intake 1132.85 ml  Output 900 ml  Net 232.85 ml    Exam  Awake Alert x 1, No new F.N deficits,  [place].AT,PERRAL Supple Neck,No JVD, No cervical lymphadenopathy appriciated.  Symmetrical Chest wall movement, Good air movement bilaterally, CTAB RRR,No Gallops,Rubs or new Murmurs, No Parasternal Heave +ve B.Sounds, Abd Soft, Non tender, No organomegaly appriciated, No rebound -guarding or rigidity. No Cyanosis, Clubbing or edema, No  new Rash or bruise   Data Review   CBC w Diff:  Lab Results  Component Value Date   WBC 5.9 06/17/2021   HGB 13.8 06/17/2021   HGB 15.1 04/14/2021   HCT 43.0 06/17/2021   HCT 47.7 (H) 04/14/2021   PLT 184 06/17/2021   PLT 198 04/14/2021   LYMPHOPCT 17 06/17/2021   MONOPCT 8 06/17/2021   EOSPCT 3 06/17/2021   BASOPCT 1 06/17/2021    CMP:  Lab Results  Component Value Date   NA 140 06/17/2021   NA 139 04/15/2020   K 3.2 (L) 06/17/2021   CL 107 06/17/2021   CO2 24 06/17/2021   BUN <5 (L) 06/17/2021   BUN 8 04/15/2020   CREATININE 0.41 (L) 06/17/2021   PROT 5.7 (L) 06/17/2021   PROT 6.4 04/14/2021   ALBUMIN 3.2 (L) 06/17/2021   ALBUMIN 4.2 04/14/2021   BILITOT 1.4 (H) 06/17/2021   BILITOT 0.7 04/14/2021   ALKPHOS 96 06/17/2021   AST 19 06/17/2021   ALT 29 06/17/2021  .   Total Time in preparing paper work, data evaluation and todays exam - 54 minutes  Lala Lund M.D on 06/17/2021 at 6:25 PM  [facility]

## 2021-06-17 NOTE — Progress Notes (Signed)
HIDA scan with patent cystic duct and normal EF. No evidence of Cholecystitis. Our team will follow peripherally. Please call back with any changes, questions, or concerns.

## 2021-06-17 NOTE — Progress Notes (Signed)
Patient ID: Brandy Mckinney, female   DOB: October 28, 1965, 55 y.o.   MRN: [id number] Patient's brother alerted front desk patient was having pain in RUQ. Upon entering room, patient stated it was gone and she would let staff know if she needed pain medicine.   Lidia Collum, RN

## 2021-06-17 NOTE — TOC Initial Note (Signed)
Transition of Care [facility]) - Initial/Assessment Note    Patient Details  Name: Brandy Mckinney MRN: [id number] Date of Birth: 11-01-1965  Transition of Care [facility] CM/SW Contact:    Lockie Pares, RN Phone Number: 06/17/2021, 8:24 AM  Clinical Narrative:                  55 YO patient presented to the ED with right sided chest pain. She has a history or MS, bed bound at home, cared for by her brother. Suspicious for cholecystitis, HIDA  scan ordered and patient  her under obs with a surgical consult.   CM will follow for needs, recommendations, and transitions , Expected DC back to home with current resources Expected Discharge Plan: Home/Self Care Barriers to Discharge: Continued Medical Work up   Patient Goals and CMS Choice        Expected Discharge Plan and Services Expected Discharge Plan: Home/Self Care       Living arrangements for the past 2 months: Single Family Home                                      Prior Living Arrangements/Services Living arrangements for the past 2 months: Single Family Home Lives with:: Relatives Patient language and need for interpreter reviewed:: Yes        Need for Family Participation in Patient Care: Yes (Comment) Care giver support system in place?: Yes (comment) Current home services: DME Criminal Activity/Legal Involvement Pertinent to Current Situation/Hospitalization: No - Comment as needed  Activities of Daily Living      Permission Sought/Granted                  Emotional Assessment       Orientation: : Oriented to Place, Oriented to Self Alcohol / Substance Use: Not Applicable Psych Involvement: No (comment)  Admission diagnosis:  Acute cholecystitis [K81.0] Hyperbilirubinemia [E80.6] RUQ pain [R10.11] Symptomatic cholelithiasis [K80.20] Chest pain, unspecified type [R07.9] Patient Active Problem List   Diagnosis Date Noted   Chest pain 06/16/2021   Elevated d-dimer 06/16/2021    Acute cholecystitis 06/15/2021   Cholelithiasis without obstruction 07/15/2020   Chronic pain 07/15/2020   Contracture of joint of hand 07/15/2020   Decreased vision 07/15/2020   Hyperglycemia due to type 2 diabetes mellitus (HCC) 07/15/2020   Pure hypercholesterolemia 07/15/2020   Recurrent major depression in full remission (HCC) 07/15/2020   Urinary incontinence 07/15/2020   High risk medication use 06/12/2019   Wheelchair bound 12/19/2017   Seizure (HCC) 07/16/2017   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 AT [address] OF [facility] [address] [place] [address] Phone: [phone] Fax: [phone]     Social Determinants of Health (SDOH) Interventions    Readmission Risk Interventions No flowsheet data found.

## 2021-06-17 NOTE — Progress Notes (Signed)
Patient ID: Brandy Mckinney, female   DOB: 06-30-1966, 55 y.o.   MRN: [id number]  An After Visit Summary was printed and given to the patient and patient's brother.   Patient education given on medication changes and follow up appointments and the patient/brother expresses understanding and acceptance of instructions.    Lidia Collum 06/17/2021 6:41 PM

## 2021-06-17 NOTE — Progress Notes (Addendum)
Patient ID: Brandy Mckinney, female   DOB: 09/22/1965, 55 y.o.   MRN: [id number]   Entered patient room to find brother in loud confrontation with Nurse Tech. Nurse Tech remained polite and helpful throughout encounter.   Patient ordered tray at 3:30 pm and was sleeping when tray arrived per dietary. Brother found tray on bedside table with food cold. Brother did not want food heated or another tray ordered. Patient stated she was not hungry.  Lidia Collum, RN

## 2021-06-17 NOTE — Plan of Care (Signed)
  Problem: Health Behavior/Discharge Planning: Goal: Ability to manage health-related needs will improve Outcome: Progressing   

## 2021-10-08 ENCOUNTER — Other Ambulatory Visit: Payer: Self-pay | Admitting: Neurology

## 2021-10-16 ENCOUNTER — Telehealth: Payer: Self-pay | Admitting: Neurology

## 2021-10-16 NOTE — Telephone Encounter (Signed)
Pt's brother Arby Barrette (on [place]) for 3 days she's having on right side;losing function, mobility and strength. Would like a call from the nurse ?

## 2021-10-19 NOTE — Telephone Encounter (Signed)
Called brother back. Sx started around 10/13/21. She is feeling ok but has lost ability in right hand to pick up items/grip. This is new.Unable to squeeze on exercise ball. Had her try and increase fluids, but ineffective.  Offered work in appointment for 10/21/21 at 930am with Dr. Felecia Shelling. He accepted. Asked they check in around 900/915am. He verbalized understanding.  ?

## 2021-10-21 ENCOUNTER — Encounter: Payer: Self-pay | Admitting: Neurology

## 2021-10-21 ENCOUNTER — Other Ambulatory Visit: Payer: Self-pay | Admitting: Neurology

## 2021-10-21 ENCOUNTER — Telehealth: Payer: Self-pay | Admitting: Neurology

## 2021-10-21 ENCOUNTER — Ambulatory Visit (INDEPENDENT_AMBULATORY_CARE_PROVIDER_SITE_OTHER): Payer: Medicare Other | Admitting: Neurology

## 2021-10-21 VITALS — BP 140/93 | HR 90 | Ht 61.0 in

## 2021-10-21 DIAGNOSIS — F0280 Dementia in other diseases classified elsewhere without behavioral disturbance: Secondary | ICD-10-CM

## 2021-10-21 DIAGNOSIS — R29898 Other symptoms and signs involving the musculoskeletal system: Secondary | ICD-10-CM | POA: Diagnosis not present

## 2021-10-21 DIAGNOSIS — R569 Unspecified convulsions: Secondary | ICD-10-CM

## 2021-10-21 DIAGNOSIS — Z79899 Other long term (current) drug therapy: Secondary | ICD-10-CM | POA: Diagnosis not present

## 2021-10-21 DIAGNOSIS — G35 Multiple sclerosis: Secondary | ICD-10-CM

## 2021-10-21 MED ORDER — PREDNISONE 50 MG PO TABS: ORAL_TABLET | ORAL | 0 refills | Status: DC

## 2021-10-21 NOTE — Progress Notes (Signed)
? ?[facility] ? ?PATIENT: Brandy Mckinney ?DOB: [birth year] ? ?REFERRING DOCTOR OR PCP: Salli Real MD ?SOURCE: Patient, notes from primary care, imaging and lab reports ? ?_________________________________ ? ? ?HISTORICAL ? ?CHIEF COMPLAINT:  ?Chief Complaint  ?Patient presents with  ? Follow-up  ?  RM 16. Last seen 04/14/21.   ? ? ?HISTORY OF PRESENT ILLNESS:  ?Brandy Mckinney is a 56 y.o. woman with multiple sclerosis. ? ?Update 10/14/20 ?She had her first [place] course in December 2020/January 2021.  She had a second year December 2021 and  January 2022.  Both times she tolerated the pills well.  She had no side effects.    CBC/D 10/14/2020 showed normal hematology labs  (lymphocytes 0.8).   ALC was 1.0 ALT was mildly elevated = 50 ? ?She did well during 2022.  5 days ago, she began to have more weakness in the right arm.  Symptoms were unchanged in the left arm and there was no new cognitive change.  The right leg is likely also weaker though difficult to assess due to baseline weakness she and her caregivers do not believe there has been any change in her bladder pattern or odor (she is incontinent) they have not noticed any skin breakdown.  Of note, she does have cholelithiasis that is being monitored and gallstones were noted on the ultrasound though she had normal ejection fracture on HIDA scan.  She continues to experience intermittent flank pain, similar to pain she had late last year but milder ? ? ?She is wheelchair bound.  She is non-ambulatory.    She does not help significantly with transfers.   She is weaker in the left arm and the right leg relative to the opposite side.  Since a few days ago, she has had more trouble feeding herself..  She lives with her brother.  She gets help from nurses aide for several hours a day..   Vision is poor but unchanged.    She has incontinence and wears Depends.   ? ?She was experiencing dysesthesias in her legs.  Gabapentin has helped her  dysesthesias.   ? ?Cognition is about with reduced STM, focus/attention.   She often has mild confusion.   She jumbles her words and also has word finding difficulties ? ?She has no recent seizures.   She takes levetiracetam and tolerates it well  ? ?She has had her Covid vaccinations and boosters. ? ?MS history:  ?She was diagnosed with MS at age 63 after presenting with optic neuritis in both eyes.  Over the next few years, she began to experience more difficulty with her gait.  This steadily progressed in her 51s 30s and 58s.  For many years, she used a walker.  About 7 or 8 years ago after repeated falls she stopped using the walker and became wheelchair-bound.  At some point, she was started on Avonex.  She remained on this medication for many years.  She continued to progress and was switched to Tecfidera.  She stayed on that medication for several years and then in 2017 was switched to Ocrevus. Travel was difficult for her to do. In 2020 she switched to [facility]. She had the first course December 2020 in January 2021.  ? ?Other neurologic history: She has a history of seizures. They have been well controlled on levetiracetam. ? ?Data: ?MRI of the brain dated 02/19/2019 [facility]): It shows multiple T2/flair hyperintense foci in the brainstem, cerebellum and in the periventricular, juxtacortical and deep  white matter of the hemispheres in a pattern compatible with MS.  There were no enhancing lesions. ? ?I reviewed lab tests from 02/19/2019: CBC was normal.  Lymphocyte count was normal.  She had an mildly elevated ALT but normal AST.  Vitamin D was normal. ? ?Hepatitis Be antigen and hepatitis B core antibody were negative.  Hep C antibody and RNA was negative.  HIV was negative.  QuantiFERON TB was negative.  Varicella-zoster IgG showed immunity. ? ? ?REVIEW OF SYSTEMS: ?Constitutional: No fevers, chills, sweats, or change in appetite.  She has some insomnia and irregular sleep. ?Eyes: No eye  pain.  She has reduced visual acuity. ?Ear, nose and throat: No hearing loss, ear pain, nasal congestion, sore throat ?Cardiovascular: No chest pain, palpitations ?Respiratory:  No shortness of breath at rest or with exertion.   No wheezes ?GastrointestinaI: No nausea, vomiting, diarrhea, abdominal pain, fecal incontinence ?Genitourinary: She has incontinence  ?musculoskeletal: She notes myalgias  ?integumentary: No rash, pruritus, skin lesions ?Neurological: as above ?Psychiatric: No depression at this time.  No anxiety ?Endocrine: No palpitations, diaphoresis, change in appetite, change in weigh or increased thirst ?Hematologic/Lymphatic:  No anemia, purpura, petechiae. ?Allergic/Immunologic: No itchy/runny eyes, nasal congestion, recent allergic reactions, rashes ? ?ALLERGIES: ?Allergies  ?Allergen Reactions  ? Fentanyl   ?  Other reaction(s): can not have anything on her skin  ? ? ?HOME MEDICATIONS: ? ?Current Outpatient Medications:  ?  Ascorbic Acid (VITAMIN C) 100 MG tablet, Take 100 mg by mouth daily., Disp: , Rfl:  ?  calcium citrate-vitamin D (CITRACAL+D) 315-200 MG-UNIT tablet, Take 1 tablet by mouth daily., Disp: , Rfl:  ?  Cranberry 500 MG TABS, Take 1 tablet by mouth daily., Disp: , Rfl:  ?  cyclobenzaprine (FLEXERIL) 10 MG tablet, Take 10 mg by mouth 3 (three) times daily as needed for muscle spasms., Disp: , Rfl:  ?  etodolac (LODINE) 400 MG tablet, TAKE 1 TABLET BY MOUTH TWICE A DAY AS NEEDED (Patient taking differently: Take 400 mg by mouth 2 (two) times daily as needed for moderate pain.), Disp: 60 tablet, Rfl: 5 ?  FLUoxetine (PROZAC) 20 MG capsule, Take 20 mg by mouth daily., Disp: , Rfl:  ?  gabapentin (NEURONTIN) 300 MG capsule, Take 1 capsule (300 mg total) by mouth 3 (three) times daily., Disp: 270 capsule, Rfl: 3 ?  Inulin (FIBER CHOICE PO), Take 1 tablet by mouth daily., Disp: , Rfl:  ?  levETIRAcetam (KEPPRA) 750 MG tablet, TAKE 1 TABLET BY MOUTH 2 TIMES DAILY., Disp: 180 tablet, Rfl: 3 ?   omeprazole (PRILOSEC) 20 MG capsule, Take 20 mg by mouth daily as needed (heartburn)., Disp: , Rfl:  ?  predniSONE (DELTASONE) 50 MG tablet, 12 pills (600 mg) po qd x 2 days, Disp: 24 tablet, Rfl: 0 ?  simvastatin (ZOCOR) 20 MG tablet, Take 20 mg by mouth every evening., Disp: , Rfl:  ? ?PAST MEDICAL HISTORY: ?Past Medical History:  ?Diagnosis Date  ? Early onset Alzheimer's dementia (HCC)   ? Hypercholesteremia   ? Hyperlipidemia   ? Hypertension   ? Lumbosacral pain   ? Multiple sclerosis (HCC)   ? Seizure (HCC)   ? Vitamin D deficiency   ? ? ?PAST SURGICAL HISTORY: ? ? ?FAMILY HISTORY: ?No family history on file. ? ?SOCIAL HISTORY: ? ?Social History  ? ?Socioeconomic History  ? Marital status: Divorced  ?  Spouse name: Not on file  ? Number of children: Not on file  ? Years  of education: Not on file  ? Highest education level: Not on file  ?Occupational History  ? Not on file  ?Tobacco Use  ? Smoking status: Former  ?  Types: Cigarettes  ?  Quit date: 2012  ?  Years since quitting: 11.2  ? Smokeless tobacco: Never  ?Substance and Sexual Activity  ? Alcohol use: Not Currently  ? Drug use: Never  ? Sexual activity: Not on file  ?Other Topics Concern  ? Not on file  ?Social History Narrative  ? Lives at home with brother tommy   ? Right handed   ? ?Social Determinants of Health  ? ?Financial Resource Strain: Not on file  ?Food Insecurity: Not on file  ?Transportation Needs: Not on file  ?Physical Activity: Not on file  ?Stress: Not on file  ?Social Connections: Not on file  ?Intimate Partner Violence: Not on file  ? ? ? ?PHYSICAL EXAM ? ?Vitals:  ? 10/21/21 0915  ?BP: (!) 140/93  ?Pulse: 90  ?SpO2: 91%  ?Height: 5\' 1"  (1.549 m)  ? ? ?Body mass index is 34.01 kg/m?. ? ? ?General: The patient is well-developed and well-nourished and in no acute distress ? ?HEENT:  Head is [place]/AT.  Sclera are anicteric.   ?  ?Musculoskeletal: She has some muscle tenderness on deep palpation.  ? ?Neurologic Exam ? ?Mental status: The  patient is alert and oriented to name and city but not date at the time of the examination.  She had reduced focus and attention and reduced immediate recall.   Speech is normal. ? ?Cranial nerves: She has

## 2021-10-21 NOTE — Telephone Encounter (Signed)
[employer] medicare/medicaid order sent to [facility], [facility] they will reach out to the patient to schedule.  ?

## 2021-10-22 LAB — COMPREHENSIVE METABOLIC PANEL
ALT: 23 IU/L (ref 0–32)
AST: 14 IU/L (ref 0–40)
Albumin/Globulin Ratio: 2.4 — ABNORMAL HIGH (ref 1.2–2.2)
Albumin: 4 g/dL (ref 3.8–4.9)
Alkaline Phosphatase: 141 IU/L — ABNORMAL HIGH (ref 44–121)
BUN/Creatinine Ratio: 14 (ref 9–23)
BUN: 7 mg/dL (ref 6–24)
Bilirubin Total: 0.6 mg/dL (ref 0.0–1.2)
CO2: 24 mmol/L (ref 20–29)
Calcium: 8.6 mg/dL — ABNORMAL LOW (ref 8.7–10.2)
Chloride: 104 mmol/L (ref 96–106)
Creatinine, Ser: 0.49 mg/dL — ABNORMAL LOW (ref 0.57–1.00)
Globulin, Total: 1.7 g/dL (ref 1.5–4.5)
Glucose: 108 mg/dL — ABNORMAL HIGH (ref 70–99)
Potassium: 3.8 mmol/L (ref 3.5–5.2)
Sodium: 145 mmol/L — ABNORMAL HIGH (ref 134–144)
Total Protein: 5.7 g/dL — ABNORMAL LOW (ref 6.0–8.5)
eGFR: 113 mL/min/{1.73_m2} (ref >59)

## 2021-10-22 LAB — CBC WITH DIFFERENTIAL/PLATELET
Basophils Absolute: 0 10*3/uL (ref 0.0–0.2)
Basos: 0 %
EOS (ABSOLUTE): 0.3 10*3/uL (ref 0.0–0.4)
Eos: 4 %
Hematocrit: 45.2 % (ref 34.0–46.6)
Hemoglobin: 15 g/dL (ref 11.1–15.9)
Immature Grans (Abs): 0.1 10*3/uL (ref 0.0–0.1)
Immature Granulocytes: 1 %
Lymphocytes Absolute: 1 10*3/uL (ref 0.7–3.1)
Lymphs: 14 %
MCH: 29.8 pg (ref 26.6–33.0)
MCHC: 33.2 g/dL (ref 31.5–35.7)
MCV: 90 fL (ref 79–97)
Monocytes Absolute: 0.5 10*3/uL (ref 0.1–0.9)
Monocytes: 7 %
Neutrophils Absolute: 5.3 10*3/uL (ref 1.4–7.0)
Neutrophils: 74 %
Platelets: 200 10*3/uL (ref 150–450)
RBC: 5.04 x10E6/uL (ref 3.77–5.28)
RDW: 13.8 % (ref 11.7–15.4)
WBC: 7.1 10*3/uL (ref 3.4–10.8)

## 2021-11-05 NOTE — Telephone Encounter (Signed)
Patient brother Brandy Mckinney called and I spoke with him and he informed me she cannot have her MRI at [facility] because she needs to go to the hospital since she is in a wheelchair. ? ? ?He also wants to know if his sister still needs the MRI's because she no longer has the temptation and she is getting use in that hand again.  ?

## 2021-11-05 NOTE — Addendum Note (Signed)
Addended by: Arther Abbott on: 11/05/2021 04:05 PM ? ? Modules accepted: Orders ? ?

## 2021-11-05 NOTE — Telephone Encounter (Signed)
Called and spoke w/ pt brother, Brandy Mckinney. Advised Dr. Epimenio Foot received message and would still like her to get MRI's completed to make sure there have not been any new developments that could have caused sx. We will go ahead and order for her to complete at [facility]. He verbalized understanding and appreciation. ? ?

## 2021-11-09 ENCOUNTER — Telehealth: Payer: Self-pay | Admitting: Neurology

## 2021-11-09 MED ORDER — GABAPENTIN 300 MG PO CAPS
300.0000 mg | ORAL_CAPSULE | Freq: Three times a day (TID) | ORAL | 3 refills | Status: DC
Start: 2021-11-09 — End: 2022-06-10

## 2021-11-09 NOTE — Telephone Encounter (Signed)
Brother (thomas, on [place]) called requesting refill for pt of gabapentin (NEURONTIN) 300 MG capsule  ?[phone] ?

## 2021-11-09 NOTE — Telephone Encounter (Signed)
[employer] medicare no auth req-order sent to [facility] , pt needs a lift, they will reach out to the patient to schedule.  ?

## 2021-11-09 NOTE — Telephone Encounter (Signed)
Refills has been forwarded for the pt to the pharmacy requested ?

## 2021-11-22 ENCOUNTER — Emergency Department (HOSPITAL_COMMUNITY): Payer: Medicare Other

## 2021-11-22 ENCOUNTER — Emergency Department (HOSPITAL_COMMUNITY)
Admission: EM | Admit: 2021-11-22 | Discharge: 2021-11-22 | Disposition: A | Discharge status: Home / Self Care | Payer: Medicare Other | Attending: Emergency Medicine | Admitting: Emergency Medicine

## 2021-11-22 DIAGNOSIS — Z794 Long term (current) use of insulin: Secondary | ICD-10-CM | POA: Diagnosis not present

## 2021-11-22 DIAGNOSIS — G35 Multiple sclerosis: Secondary | ICD-10-CM | POA: Diagnosis not present

## 2021-11-22 DIAGNOSIS — R791 Abnormal coagulation profile: Secondary | ICD-10-CM | POA: Diagnosis not present

## 2021-11-22 DIAGNOSIS — R4701 Aphasia: Secondary | ICD-10-CM | POA: Insufficient documentation

## 2021-11-22 DIAGNOSIS — N9489 Other specified conditions associated with female genital organs and menstrual cycle: Secondary | ICD-10-CM | POA: Diagnosis not present

## 2021-11-22 DIAGNOSIS — R531 Weakness: Secondary | ICD-10-CM | POA: Diagnosis not present

## 2021-11-22 DIAGNOSIS — R000 Tachycardia, unspecified: Secondary | ICD-10-CM | POA: Insufficient documentation

## 2021-11-22 DIAGNOSIS — R29810 Facial weakness: Secondary | ICD-10-CM | POA: Diagnosis not present

## 2021-11-22 DIAGNOSIS — R2990 Unspecified symptoms and signs involving the nervous system: Secondary | ICD-10-CM

## 2021-11-22 DIAGNOSIS — G309 Alzheimer's disease, unspecified: Secondary | ICD-10-CM | POA: Insufficient documentation

## 2021-11-22 DIAGNOSIS — F0280 Dementia in other diseases classified elsewhere without behavioral disturbance: Secondary | ICD-10-CM | POA: Insufficient documentation

## 2021-11-22 LAB — DIFFERENTIAL
Abs Immature Granulocytes: 0.14 10*3/uL — ABNORMAL HIGH (ref 0.00–0.07)
Basophils Absolute: 0 10*3/uL (ref 0.0–0.1)
Basophils Relative: 1 %
Eosinophils Absolute: 0.1 10*3/uL (ref 0.0–0.5)
Eosinophils Relative: 2 %
Immature Granulocytes: 2 %
Lymphocytes Relative: 14 %
Lymphs Abs: 0.9 10*3/uL (ref 0.7–4.0)
Monocytes Absolute: 0.4 10*3/uL (ref 0.1–1.0)
Monocytes Relative: 6 %
Neutro Abs: 5 10*3/uL (ref 1.7–7.7)
Neutrophils Relative %: 75 %

## 2021-11-22 LAB — COMPREHENSIVE METABOLIC PANEL
ALT: 28 U/L (ref 0–44)
AST: 25 U/L (ref 15–41)
Albumin: 3.7 g/dL (ref 3.5–5.0)
Alkaline Phosphatase: 111 U/L (ref 38–126)
Anion gap: 8 (ref 5–15)
BUN: 7 mg/dL (ref 6–20)
CO2: 25 mmol/L (ref 22–32)
Calcium: 9.1 mg/dL (ref 8.9–10.3)
Chloride: 107 mmol/L (ref 98–111)
Creatinine, Ser: 0.59 mg/dL (ref 0.44–1.00)
GFR, Estimated: >60 mL/min (ref >60)
Glucose, Bld: 157 mg/dL — ABNORMAL HIGH (ref 70–99)
Potassium: 3.6 mmol/L (ref 3.5–5.1)
Sodium: 140 mmol/L (ref 135–145)
Total Bilirubin: 2 mg/dL — ABNORMAL HIGH (ref 0.3–1.2)
Total Protein: 6.5 g/dL (ref 6.5–8.1)

## 2021-11-22 LAB — I-STAT CHEM 8, ED
BUN: 9 mg/dL (ref 6–20)
Calcium, Ion: 1.01 mmol/L — ABNORMAL LOW (ref 1.15–1.40)
Chloride: 105 mmol/L (ref 98–111)
Creatinine, Ser: 0.5 mg/dL (ref 0.44–1.00)
Glucose, Bld: 159 mg/dL — ABNORMAL HIGH (ref 70–99)
HCT: 47 % — ABNORMAL HIGH (ref 36.0–46.0)
Hemoglobin: 16 g/dL — ABNORMAL HIGH (ref 12.0–15.0)
Potassium: 3.5 mmol/L (ref 3.5–5.1)
Sodium: 140 mmol/L (ref 135–145)
TCO2: 23 mmol/L (ref 22–32)

## 2021-11-22 LAB — PROTIME-INR
INR: 1.1 (ref 0.8–1.2)
Prothrombin Time: 13.9 seconds (ref 11.4–15.2)

## 2021-11-22 LAB — CBG MONITORING, ED: Glucose-Capillary: 133 mg/dL — ABNORMAL HIGH (ref 70–99)

## 2021-11-22 LAB — CBC
HCT: 47.1 % — ABNORMAL HIGH (ref 36.0–46.0)
Hemoglobin: 15.3 g/dL — ABNORMAL HIGH (ref 12.0–15.0)
MCH: 30 pg (ref 26.0–34.0)
MCHC: 32.5 g/dL (ref 30.0–36.0)
MCV: 92.4 fL (ref 80.0–100.0)
Platelets: 191 10*3/uL (ref 150–400)
RBC: 5.1 MIL/uL (ref 3.87–5.11)
RDW: 14.5 % (ref 11.5–15.5)
WBC: 6.6 10*3/uL (ref 4.0–10.5)
nRBC: 0 % (ref 0.0–0.2)

## 2021-11-22 LAB — I-STAT BETA HCG BLOOD, ED (MC, WL, AP ONLY): I-stat hCG, quantitative: 10.9 m[IU]/mL — ABNORMAL HIGH (ref <5)

## 2021-11-22 LAB — APTT: aPTT: 29 seconds (ref 24–36)

## 2021-11-22 MED ORDER — IOHEXOL 350 MG/ML SOLN
100.0000 mL | Freq: Once | INTRAVENOUS | Status: AC | PRN
  Administered 2021-11-22: 100 mL via INTRAVENOUS

## 2021-11-22 MED ORDER — SODIUM CHLORIDE 0.9% FLUSH
3.0000 mL | Freq: Once | INTRAVENOUS | Status: AC
  Administered 2021-11-22: 3 mL via INTRAVENOUS

## 2021-11-22 NOTE — ED Notes (Signed)
Patient transported to MRI 

## 2021-11-22 NOTE — ED Provider Notes (Signed)
?[facility] EMERGENCY DEPARTMENT ?Provider Note ? ? ?CSN: [id number] ?Arrival date & time: 11/22/21  1235 ? ?An emergency department physician performed an initial assessment on this suspected stroke patient at 69. ? ?History ? ?Chief Complaint  ?Patient presents with  ? Code Stroke  ? ? ?Brandy Mckinney is a 56 y.o. female. ? ?HPI ? ?55 year old female with past medical history of Alzheimer's dementia, MS, seizure disorder presents emergency department by EMS for concern of aphasia, right-sided weakness and facial droop.  Patient is noted to have right upper extremity weakness at baseline.  Report from EMS is that last known normal was around 945/10 with witnessed change in mental status.  On arrival patient had noted facial droop, aphasia, history limited secondary to this.  Level 5 caveat.  Patient evaluated as a code stroke with EMS/stroke neurology at bedside.  No family member at bedside currently. ? ?Home Medications ?Prior to Admission medications   ?Medication Sig Start Date End Date Taking? Authorizing Provider  ?Ascorbic Acid (VITAMIN C) 100 MG tablet Take 100 mg by mouth daily.    [provider]  ?calcium citrate-vitamin D (CITRACAL+D) 315-200 MG-UNIT tablet Take 1 tablet by mouth daily.    [provider]  ?Cranberry 500 MG TABS Take 1 tablet by mouth daily.    [provider]  ?cyclobenzaprine (FLEXERIL) 10 MG tablet Take 10 mg by mouth 3 (three) times daily as needed for muscle spasms. 04/17/19   [provider]  ?etodolac (LODINE) 400 MG tablet TAKE 1 TABLET BY MOUTH TWICE A DAY AS NEEDED ?Patient taking differently: Take 400 mg by mouth 2 (two) times daily as needed for moderate pain. 05/27/21   Sater, Pearletha Furl, MD  ?FLUoxetine (PROZAC) 20 MG capsule Take 20 mg by mouth daily. 12/16/17   [provider]  ?gabapentin (NEURONTIN) 300 MG capsule Take 1 capsule (300 mg total) by mouth 3 (three) times daily. 11/09/21   Sater, Pearletha Furl, MD   ?Inulin (FIBER CHOICE PO) Take 1 tablet by mouth daily.    [provider]  ?levETIRAcetam (KEPPRA) 750 MG tablet TAKE 1 TABLET BY MOUTH 2 TIMES DAILY. 10/08/21   Sater, Pearletha Furl, MD  ?omeprazole (PRILOSEC) 20 MG capsule Take 20 mg by mouth daily as needed (heartburn). 01/27/21   [provider]  ?predniSONE (DELTASONE) 50 MG tablet TAKE 12 PILLS (600 MG) BY MOUTH EVERY DAY X 2 DAYS 10/21/21   Sater, Pearletha Furl, MD  ?simvastatin (ZOCOR) 20 MG tablet Take 20 mg by mouth every evening. 05/09/13   [provider]  ?   ? ?Allergies    ?Fentanyl   ? ?Review of Systems   ?Review of Systems  ?Unable to perform ROS: Patient nonverbal  ? ?Physical Exam ?Updated Vital Signs ?BP (!) 135/97   Pulse (!) 109   Temp 98.8 ?F (37.1 ?C) (Oral)   Resp 17   Ht 5\' 1"  (1.549 m)   Wt 79.2 kg   SpO2 96%   BMI 32.99 kg/m?  ?Physical Exam ?HENT:  ?   Head: Atraumatic.  ?Cardiovascular:  ?   Rate and Rhythm: Tachycardia present.  ?Abdominal:  ?   Palpations: Abdomen is soft.  ?Musculoskeletal:  ?   Comments: Contracted extremity  ?Neurological:  ?   Mental Status: She is alert.  ?   Comments: Right nasolabila fold flattening, aphasia  ? ? ?ED Results / Procedures / Treatments   ?Labs ?(all labs ordered are listed, but only abnormal results  are displayed) ?Labs Reviewed  ?CBC - Abnormal; Notable for the following components:  ?    Result Value  ? Hemoglobin 15.3 (*)   ? HCT 47.1 (*)   ? All other components within normal limits  ?DIFFERENTIAL - Abnormal; Notable for the following components:  ? Abs Immature Granulocytes 0.14 (*)   ? All other components within normal limits  ?COMPREHENSIVE METABOLIC PANEL - Abnormal; Notable for the following components:  ? Glucose, Bld 157 (*)   ? Total Bilirubin 2.0 (*)   ? All other components within normal limits  ?I-STAT CHEM 8, ED - Abnormal; Notable for the following components:  ? Glucose, Bld 159 (*)   ? Calcium, Ion 1.01 (*)   ? Hemoglobin 16.0 (*)   ? HCT 47.0 (*)   ?  All other components within normal limits  ?CBG MONITORING, ED - Abnormal; Notable for the following components:  ? Glucose-Capillary 133 (*)   ? All other components within normal limits  ?I-STAT BETA HCG BLOOD, ED ([facility], [facility], [facility] ONLY) - Abnormal; Notable for the following components:  ? I-stat hCG, quantitative 10.9 (*)   ? All other components within normal limits  ?PROTIME-INR  ?APTT  ?LEVETIRACETAM LEVEL  ? ? ?EKG ?None ? ?Radiology ?CT HEAD CODE STROKE WO CONTRAST ? ?Result Date: 11/22/2021 ?CLINICAL DATA:  Code stroke.  Right-sided weakness EXAM: CT HEAD WITHOUT CONTRAST TECHNIQUE: Contiguous axial images were obtained from the base of the skull through the vertex without intravenous contrast. RADIATION DOSE REDUCTION: This exam was performed according to the departmental dose-optimization program which includes automated exposure control, adjustment of the mA and/or kV according to patient size and/or use of iterative reconstruction technique. COMPARISON:  Correlation made with 2021 brain MRI FINDINGS: Brain: There is no acute intracranial hemorrhage, mass effect, or edema. Gray-white differentiation is preserved. Patchy and confluent areas of hypoattenuation in the supratentorial periventricular greater than subcortical white matter likely reflect sequelae of multiple sclerosis. Prominence of the ventricles and sulci reflects parenchymal volume loss greater than expected for age. No extra-axial collection. Vascular: No hyperdense vessel. Skull: Unremarkable. Sinuses/Orbits: No acute abnormality. Other: Mastoid air cells are clear. ASPECTS (Alberta Stroke Program Early CT Score) - Ganglionic level infarction (caudate, lentiform nuclei, internal capsule, insula, M1-M3 cortex): 7 - Supraganglionic infarction (M4-M6 cortex): 3 Total score (0-10 with 10 being normal): 10 IMPRESSION: There is no acute intracranial hemorrhage or evidence of acute infarction. ASPECT score is 10. Advanced chronic demyelinating disease.  Advanced parenchymal volume loss. These results were communicated to Dr. Iver Nestle at 12:54 pm on 11/22/2021 by text page via the [facility] messaging system. Electronically Signed   By: Guadlupe Spanish M.D.   On: 11/22/2021 12:55  ? ?CT ANGIO HEAD NECK W WO CM W PERF (CODE STROKE) ? ?Result Date: 11/22/2021 ?CLINICAL DATA:  Neuro deficit, acute, stroke suspected EXAM: CT ANGIOGRAPHY HEAD AND NECK CT PERFUSION BRAIN TECHNIQUE: Multidetector CT imaging of the head and neck was performed using the standard protocol during bolus administration of intravenous contrast. Multiplanar CT image reconstructions and MIPs were obtained to evaluate the vascular anatomy. Carotid stenosis measurements (when applicable) are obtained utilizing NASCET criteria, using the distal internal carotid diameter as the denominator. Multiphase CT imaging of the brain was performed following IV bolus contrast injection. Subsequent parametric perfusion maps were calculated using RAPID software. RADIATION DOSE REDUCTION: This exam was performed according to the departmental dose-optimization program which includes automated exposure control, adjustment of the mA and/or kV according to patient  size and/or use of iterative reconstruction technique. CONTRAST:  OMNIPAQUE IOHEXOL 350 MG/ML SOLN COMPARISON:  None. FINDINGS: CTA NECK Aortic arch: Unremarkable. Great vessel origins are patent. Right carotid system: Patent. No stenosis. Left carotid system: Patent. No stenosis. Vertebral arteries: Patent and codominant. No stenosis. Skeleton: Mild degenerative changes of the cervical spine. Other neck: Unremarkable. Upper chest: Emphysema. Review of the MIP images confirms the above findings CTA HEAD Anterior circulation: Intracranial internal carotid arteries are patent with calcified plaque. There is up to moderate stenosis in the paraclinoid region. Anterior and middle cerebral arteries are patent. Posterior circulation: Intracranial vertebral arteries,  basilar artery, and posterior cerebral arteries are patent. Venous sinuses: Patent as allowed by contrast bolus timing. Review of the MIP images confirms the above findings CT Brain Perfusion Findings: CBF (<3

## 2021-11-22 NOTE — ED Notes (Signed)
RN discussed discharge paperwork with pt and her family member at bedside and answered all questions. This RN verified pt's home address for PTAR transport home before family members leaves ED. Sandwich bag and apple juice provided to pt. ?

## 2021-11-22 NOTE — Consult Note (Signed)
Neurology Consultation ?Reason for Consult: Code stroke ?Requesting Physician: Coralee Pesa ? ?CC: Aphasia and right-sided weakness ? ?History is obtained from: Chart review and care giver at bedside ? ?HPI: Brandy Mckinney is a 56 y.o. female with past medical history significant for MS, hypertension, hyperlipidemia, hypercholesterolemia, obesity (BMI 32), dementia related to MS, seizures on Keppra, vitamin D deficiency. ? ?Her caregiver reports that she was in her normal state of health this morning, he had helped her with her bath and getting her some food to eat at around 11 AM when he left to walk the dogs.  Upon his return at 11:45 AM, he found her to be weak on the right side and unable to talk to him.  He reports that she had a similar episode 2 months ago which on chart review was noted to be worsened weakness of the arm and leg without mental status change.  He reported that that episode took about 10 days to resolve, but today by the time of his arrival to the ED her symptoms had resolved and she was back to her baseline. ? ?At baseline she is not oriented to month or age, has minimal movement of bilateral legs, has contracture of the left upper extremity, has some mild dysarthria and is bedridden, incontinent, requiring total care ? ?LKW: 11 AM ?tPA given?: No, symptoms resolved ?Premorbid modified rankin scale:  ?    5 - Severe disability. Requires constant nursing care and attention, bedridden, incontinent. ? ?ROS: Unable to obtain due to altered mental status.  ? ?Past Medical History:  ?Diagnosis Date  ? Early onset Alzheimer's dementia (HCC)   ? Hypercholesteremia   ? Hyperlipidemia   ? Hypertension   ? Lumbosacral pain   ? Multiple sclerosis (HCC)   ? Seizure (HCC)   ? Vitamin D deficiency   ? ?Past Surgical History:  ?Procedure Laterality Date  ? ABDOMINAL HYSTERECTOMY    ? CESAREAN SECTION    ? OOPHORECTOMY    ? ?Current Outpatient Medications  ?Medication Instructions  ? calcium  citrate-vitamin D (CITRACAL+D) 315-200 MG-UNIT tablet 1 tablet, Oral, Daily  ? Cranberry 500 MG TABS 1 tablet, Oral, Daily  ? cyclobenzaprine (FLEXERIL) 10 mg, Oral, 3 times daily PRN  ? etodolac (LODINE) 400 MG tablet TAKE 1 TABLET BY MOUTH TWICE A DAY AS NEEDED  ? FLUoxetine (PROZAC) 20 mg, Oral, Daily  ? gabapentin (NEURONTIN) 300 mg, Oral, 3 times daily  ? Inulin (FIBER CHOICE PO) 1 tablet, Oral, Daily  ? levETIRAcetam (KEPPRA) 750 MG tablet TAKE 1 TABLET BY MOUTH 2 TIMES DAILY.  ? omeprazole (PRILOSEC) 20 mg, Oral, Daily PRN  ? predniSONE (DELTASONE) 50 MG tablet TAKE 12 PILLS (600 MG) BY MOUTH EVERY DAY X 2 DAYS  ? simvastatin (ZOCOR) 20 mg, Oral, Every evening  ? vitamin C 100 mg, Oral, Daily  ? ? ? ?No family history on file. ? ? ?Social History:  reports that she quit smoking about 11 years ago. She has never used smokeless tobacco. She reports that she does not currently use alcohol. She reports that she does not use drugs. ? ? ?Exam: ?Current vital signs: ?BP 134/78   Pulse (!) 107   Temp 98.8 ?F (37.1 ?C) (Oral)   Resp 13   Ht 5\' 1"  (1.549 m)   Wt 79.2 kg   SpO2 98%   BMI 32.99 kg/m?  ?Vital signs in last 24 hours: ?Temp:  [98.8 ?F (37.1 ?C)] 98.8 ?F (37.1 ?C) (04/30 1316) ?Pulse  Rate:  [107] 107 (04/30 1330) ?Resp:  [11-13] 13 (04/30 1330) ?BP: (129-134)/(78-93) 134/78 (04/30 1330) ?SpO2:  [96 %-98 %] 98 % (04/30 1330) ?Weight:  [79.2 kg] 79.2 kg (04/30 1316) ? ? ?Physical Exam  ?Constitutional: Appears chronically ill ?Psych: Initially confused and minimally interactive, subsequently jocular ?Eyes: No scleral injection ?HENT: No oropharyngeal obstruction.  ?MSK: Contracted left upper extremity, particularly the hand ?Cardiovascular: Perfusing extremities well ?Respiratory: Effort normal, non-labored breathing ?GI: Soft.  No distension.  She has some moderate tenderness to palpation of the right side which is baseline ?Skin: Warm dry and intact visible skin ? ?Neuro: ?Mental Status: ?Patient is  awake, alert, not oriented to age or month which is her baseline, though she is able to identify her caregiver ?She can follow simple commands, name simple objects, repeat simple but not complex sentences ?Cranial Nerves: ?II: Visual Fields are full. Pupils are equal, round, and reactive to light.  Did not test APD ?III,IV, VI: She does have bilateral INO ?V: Facial sensation is symmetric to eyelash brush ?VII: Facial movement initially had a mild right facial droop which resolved and subsequently had a mild left facial droop which is her baseline ?VIII: hearing is intact to voice ?XII: tongue is midline without atrophy or fasciculations.  ?Motor: ?Tone is spastic.  Bulk is reduced.  Left upper extremity hand is contracted, but she is able to move it antigravity.  There is no drift of bilateral upper extremities but there is mild pronation.  She can wiggle bilateral toes ?Sensory: ?She reports sensation is equal in bilateral arms and legs ?Cerebellar: ?Finger-nose intact bilaterally within limits of weakness ?Gait:  ?Bedbound at baseline ? ?NIHSS total 10 (essentially her baseline other than right nasolabial fold flattening) ?Score breakdown: 2 points for not answering month or age correctly, one-point for right nasolabial fold flattening, 3 points for left lower extremity weakness, 3 points for right lower extremity weakness, one-point for mild ataxia of the right upper extremity, one-point for mild aphasia (unable to repeat complex sentences) one-point for mild dysarthria ?Performed at time of patient arrival to ED  ? ? ?I have reviewed labs in epic and the results pertinent to this consultation are: ? ?Basic Metabolic Panel: ?Recent Labs  ?Lab 11/22/21 ?1237 11/22/21 ?1246  ?NA 140 140  ?K 3.6 3.5  ?CL 107 105  ?CO2 25  --   ?GLUCOSE 157* 159*  ?BUN 7 9  ?CREATININE 0.59 0.50  ?CALCIUM 9.1  --   ? ? ?CBC: ?Recent Labs  ?Lab 11/22/21 ?1237 11/22/21 ?1246  ?WBC 6.6  --   ?NEUTROABS 5.0  --   ?HGB 15.3* 16.0*  ?HCT  47.1* 47.0*  ?MCV 92.4  --   ?PLT 191  --   ? ? ?Coagulation Studies: ?Recent Labs  ?  11/22/21 ?1237  ?LABPROT 13.9  ?INR 1.1  ?  ? ? ?I have reviewed the images obtained: ? ?Head CT, no acute process, chronic changes c/w known MS ?CTA, no LVO or significant stenosis, CTP no tissue at risk  ?    CTP obtained as a branch MCA trifurcation occlusion can be missed on just CTA and this would be devastating false negative for significant deficits such as aphasia ? ?MRI brain personally reviewed, no acute intracranial process ? ?ECHO Nov 2022 ? 1. Left ventricular ejection fraction, by estimation, is 60 to 65%. The  ?left ventricle has normal function. The left ventricle has no regional  ?wall motion abnormalities. There is mild left ventricular  hypertrophy.  ?Left ventricular diastolic parameters  ?are indeterminate.  ? 2. Right ventricular systolic function is normal. The right ventricular  ?size is normal. Tricuspid regurgitation signal is inadequate for assessing  ?PA pressure.  ? 3. The mitral valve is normal in structure. Trivial mitral valve  ?regurgitation. No evidence of mitral stenosis.  ? 4. The aortic valve was not well visualized. Aortic valve regurgitation  ?is not visualized. No aortic stenosis is present.  ? 5. The inferior vena cava is normal in size with greater than 50%  ?respiratory variability, suggesting right atrial pressure of 3 mmHg.  ? ?No results found for: HGBA1C  ?No results found for: CHOL, HDL, LDLCALC, LDLDIRECT, TRIG, CHOLHDL ? ? ?Impression: This is a 56 year old woman with advanced multiple sclerosis presenting with a transient episode of right-sided weakness which has resolved.  She does have multiple vascular risk factors (hypertension, hyperlipidemia, obesity, former smoking).  The differential is TIA versus seizure.  Given she had a very similar presentation with gradual recovery over several days 2 months ago, I am less concerned that this is a TIA.  Additionally she has had recent  echocardiogram and no new cardiac symptoms per caregiver.  However out of an abundance of caution we will add aspirin 81 mg, given caregiver reports no concern for bleeding recently ? ?Code stroke recommendations: ?CTA

## 2021-11-22 NOTE — ED Notes (Signed)
Patient returned from MRI.

## 2021-11-22 NOTE — ED Triage Notes (Signed)
Pt to ED via EMS from home. Code [place] activated PTA by EMS, LKW 1100. Caregiver states pt was eating breakfast and became aphasic, unable to move right arm, and developed a facial droop. Airway cleared by EDP and patient transported to CT 1 with neurologist. Hx right sided weakness, MS, seizures. ? ?EMS v/s: ?115 HR ?93% room air ?144/98 ?162 cbg ? ?18gRAC ?

## 2021-11-22 NOTE — ED Notes (Signed)
Ptar called 

## 2021-11-22 NOTE — ED Notes (Signed)
Pt's CBG result was 133. Informed April - RN. ?

## 2021-11-22 NOTE — Discharge Instructions (Addendum)
You have been seen and discharged from the emergency department.  Establish and continue outpatient follow-up with neurology.  Take prescribed medications.  Follow-up with your primary provider for further evaluation and further care. Take home medications as prescribed. If you have any worsening symptoms or further concerns for your health please return to an emergency department for further evaluation. ?

## 2021-11-23 ENCOUNTER — Other Ambulatory Visit: Payer: Self-pay | Admitting: Neurology

## 2021-11-23 ENCOUNTER — Telehealth: Payer: Self-pay | Admitting: Neurology

## 2021-11-23 MED ORDER — LEVETIRACETAM 1000 MG PO TABS: 1000.0000 mg | ORAL_TABLET | Freq: Two times a day (BID) | ORAL | 5 refills | Status: DC

## 2021-11-23 NOTE — Telephone Encounter (Signed)
Called the brother back. After reviewing the hospital neurologist notes they recommended the patient increase to 1000 mg BID on her Keppra. However that adjustment was not made at dc. Dr Epimenio Foot agreed with the increase of the dosage and would like to increase to that strength. Confirmed the pharmacy was [facility] [address]. Increased to the to 1000 mg BID. ? ?

## 2021-11-23 NOTE — Telephone Encounter (Signed)
Called the patients brother back. He states that she is back to baseline and doing well. When I questioned about her medication he didn't mention any increase or change in the medication. When I looked at medication list the keppra is still ordered 750 mg BID. Advised that Dr Epimenio Foot reviewed the imaging completed in the hospital and didn't see anything concerning or new.  ?Advised that per Dr Epimenio Foot there is no need to bring her in sooner for evaluation if back to baseline. Advised to contact us if she has any problems or concerns in the meantime.  ?Brother verbalized understanding and was appreciative for the call back. He stated he just wanted to keep [place] updated.  ? ? ? ?

## 2021-11-23 NOTE — Telephone Encounter (Signed)
Pt brother calling (on [place])  ?Pt was admitted into hospital yesterday 11/22/2021.  ?Pt was experiencing no talking , swallowing, and blank expressions.Pt is now back to normal after hospital visit.  ?Hospital preformed MRI on pt yesterday.  ?Was released last night around midnight 11/22/2021.  ?No answers from the hospital as to exactly what happened.  ?Would like a call back if any other information is needed.  ?

## 2021-11-30 ENCOUNTER — Ambulatory Visit (HOSPITAL_COMMUNITY): Payer: Medicare Other

## 2021-11-30 ENCOUNTER — Ambulatory Visit (HOSPITAL_COMMUNITY): Payer: Medicare Other | Attending: Neurology

## 2021-12-02 ENCOUNTER — Other Ambulatory Visit: Payer: Self-pay | Admitting: Neurology

## 2022-02-10 ENCOUNTER — Other Ambulatory Visit: Payer: Self-pay | Admitting: Family Medicine

## 2022-02-10 DIAGNOSIS — Z1231 Encounter for screening mammogram for malignant neoplasm of breast: Secondary | ICD-10-CM

## 2022-04-14 ENCOUNTER — Ambulatory Visit: Payer: Medicare Other | Admitting: Neurology

## 2022-04-14 ENCOUNTER — Ambulatory Visit: Payer: Medicare Other

## 2022-05-11 ENCOUNTER — Other Ambulatory Visit: Payer: Self-pay

## 2022-05-11 ENCOUNTER — Encounter (HOSPITAL_COMMUNITY): Payer: Self-pay | Admitting: Obstetrics and Gynecology

## 2022-05-11 NOTE — Progress Notes (Signed)
PCP - Sela Hilding, MD Cardiologist - denies  PPM/ICD - denies  EKG - 11/23/21 Stress Test - denies ECHO - denies Cardiac Cath - denies  CPAP - n/a  Fasting Blood Sugar - n/a  Blood Thinner Instructions: n/a Aspirin Instructions: Patient was instructed: As of today, STOP taking any Aspirin (unless otherwise instructed by your surgeon) Aleve, Naproxen, Ibuprofen, Motrin, Advil, Goody's, BC's, all herbal medications, fish oil, and all vitamins.  ERAS Protcol - yes, until 06:00 o'clock  COVID TEST- n/a  Anesthesia review: yes - cardiac history  Patient verbally denies any shortness of breath, fever, cough and chest pain during phone call   -------------  SDW INSTRUCTIONS given:  Your procedure is scheduled on Wednesday, October 18th, 2023.  Report to [facility] Main Entrance "A" at 06:30 A.M., and check in at the Admitting office.  Call this number if you have problems the morning of surgery:  [phone]   Remember:  Do not eat after midnight the night before your surgery  You may drink clear liquids until 06:00 the morning of your surgery.   Clear liquids allowed are: Water, Non-Citrus Juices (without pulp), Carbonated Beverages, Clear Tea, Black Coffee Only, and Gatorade    Take these medicines the morning of surgery with A SIP OF WATER Gabapentin, Prilosec, Prozac, Keppra PRN - eye drops   The day of surgery:                     Do not wear jewelry, make up, or nail polish            Do not wear lotions, powders, perfumes, or deodorant.            Do not shave 48 hours prior to surgery.              Do not bring valuables to the hospital.            [facility] is not responsible for any belongings or valuables.  Do NOT Smoke (Tobacco/Vaping) 24 hours prior to your procedure If you use a CPAP at night, you may bring all equipment for your overnight stay.   Contacts, glasses, dentures or bridgework may not be worn into surgery.      For patients admitted  to the hospital, discharge time will be determined by your treatment team.   Patients discharged the day of surgery will not be allowed to drive home, and someone needs to stay with them for 24 hours.    Special instructions:   [place]- Preparing For Surgery  Before surgery, you can play an important role. Because skin is not sterile, your skin needs to be as free of germs as possible. You can reduce the number of germs on your skin by washing with CHG (chlorahexidine gluconate) Soap before surgery.  CHG is an antiseptic cleaner which kills germs and bonds with the skin to continue killing germs even after washing.    Oral Hygiene is also important to reduce your risk of infection.  Remember - BRUSH YOUR TEETH THE MORNING OF SURGERY WITH YOUR REGULAR TOOTHPASTE  Please do not use if you have an allergy to CHG or antibacterial soaps. If your skin becomes reddened/irritated stop using the CHG.  Do not shave (including legs and underarms) for at least 48 hours prior to first CHG shower. It is OK to shave your face.  Please follow these instructions carefully.   Shower the NIGHT BEFORE SURGERY and the MORNING OF  SURGERY with DIAL Soap.   Pat yourself dry with a CLEAN TOWEL.  Wear CLEAN PAJAMAS to bed the night before surgery  Place CLEAN SHEETS on your bed the night of your first shower and DO NOT SLEEP WITH PETS.   Day of Surgery: Please shower morning of surgery  Wear Clean/Comfortable clothing the morning of surgery Do not apply any deodorants/lotions.   Remember to brush your teeth WITH YOUR REGULAR TOOTHPASTE.   Questions were answered. Patient verbalized understanding of instructions.

## 2022-05-11 NOTE — H&P (Signed)
Brandy Mckinney is an 56 y.o. G3P3 who is admitted for exam under anesthesia and pap smear for cervical cancer screening.  Patient has limited mobility and is in a special wheelchair chair. Unable to visualize cervix outpatient. Reports normal pap smear 2 years ago - records requested but never obtained.  Patient Active Problem List   Diagnosis Date Noted   Right arm weakness 10/21/2021   Chest pain 06/16/2021   Elevated d-dimer 06/16/2021   Acute cholecystitis 06/15/2021   Cholelithiasis without obstruction 07/15/2020   Chronic pain 07/15/2020   Contracture of joint of hand 07/15/2020   Decreased vision 07/15/2020   Hyperglycemia due to type 2 diabetes mellitus ([place]) 07/15/2020   Pure hypercholesterolemia 07/15/2020   Recurrent major depression in full remission ([place]) 07/15/2020   Urinary incontinence 07/15/2020   High risk medication use 06/12/2019   Wheelchair bound 12/19/2017   Seizure ([place]) 07/16/2017   Dementia associated with other underlying disease without behavioral disturbance ([place]) 11/14/2015   Fall 07/08/2011   Multiple sclerosis ([place]) 12/12/2006    MEDICAL/FAMILY/SOCIAL HX: No LMP recorded.    Past Medical History:  Diagnosis Date   Early onset Alzheimer's dementia ([place])    Hypercholesteremia    Hyperlipidemia    Hypertension    Lumbosacral pain    Multiple sclerosis ([place])    Seizure ([place])    Vitamin D deficiency     Past Surgical History:  Procedure Laterality Date   ABDOMINAL HYSTERECTOMY     CESAREAN SECTION     OOPHORECTOMY      No family history on file.  Social History:  reports that she quit smoking about 11 years ago. She has never used smokeless tobacco. She reports that she does not currently use alcohol. She reports that she does not use drugs.  ALLERGIES/MEDS:  Allergies:  Allergies  Allergen Reactions   Fentanyl Other (See Comments)     can not have anything on her skin    No medications prior to admission.     Review of  Systems  Constitutional: Negative.   HENT: Negative.    Eyes: Negative.   Respiratory: Negative.    Cardiovascular: Negative.   Gastrointestinal: Negative.   Genitourinary: Negative.   Musculoskeletal: Negative.   Skin: Negative.   Neurological: Negative.   Endo/Heme/Allergies: Negative.   Psychiatric/Behavioral: Negative.      There were no vitals taken for this visit. Gen:  NAD, pleasant and cooperative Cardio:  Normal rate Pulm:  No increased work of breathing Abd:  Soft, non-distended, non-tender throughout, no rebound/guarding Ext:  No edema present Pelvic: labia - unremarkable, urethra normal, urethral meatus normal, vagina - pink moist mucosa, no lesions or abnormal discharge, cervix - difficult to visualize and palpate, significant stool burden palpated through posterior vaginal wall, significantly limited exam due to constipation.   No results found for this or any previous visit (from the past 24 hour(s)).  No results found.   ASSESSMENT/PLAN: Brandy Mckinney is a 56 y.o. G3P3 who is admitted for exam under anesthesia and pap smear for cervical cancer screening.  - Admit to [facility] Main OR - Diet:  ERAS pathway/per anesthesia - IVF:  Per anesthesia - VTE Prophylaxis:  SCDs  Consents: I counseled the patient that this procedure is to evaluate her GYN anatomy and screen for cervical cancer. We discussed that there is low risk for infection, bleeding, or injury to surrounding organs.  Drema Dallas, DO

## 2022-05-12 ENCOUNTER — Ambulatory Visit (HOSPITAL_COMMUNITY): Payer: Medicare Other | Admitting: Physician Assistant

## 2022-05-12 ENCOUNTER — Encounter (HOSPITAL_COMMUNITY): Payer: Self-pay | Admitting: Obstetrics and Gynecology

## 2022-05-12 ENCOUNTER — Other Ambulatory Visit: Payer: Self-pay

## 2022-05-12 ENCOUNTER — Other Ambulatory Visit (HOSPITAL_COMMUNITY): Admission: RE | Admit: 2022-05-12 | Payer: Medicare Other | Source: Ambulatory Visit

## 2022-05-12 ENCOUNTER — Ambulatory Visit (HOSPITAL_BASED_OUTPATIENT_CLINIC_OR_DEPARTMENT_OTHER): Payer: Medicare Other | Admitting: Physician Assistant

## 2022-05-12 ENCOUNTER — Ambulatory Visit (HOSPITAL_COMMUNITY)
Admission: RE | Admit: 2022-05-12 | Discharge: 2022-05-12 | Disposition: A | Discharge status: Home / Self Care | Payer: Medicare Other | Attending: Obstetrics and Gynecology | Admitting: Obstetrics and Gynecology

## 2022-05-12 ENCOUNTER — Encounter (HOSPITAL_COMMUNITY)
Admission: RE | Disposition: A | Discharge status: Home / Self Care | Payer: Self-pay | Source: Home / Self Care | Attending: Obstetrics and Gynecology

## 2022-05-12 DIAGNOSIS — K5900 Constipation, unspecified: Secondary | ICD-10-CM | POA: Insufficient documentation

## 2022-05-12 DIAGNOSIS — R569 Unspecified convulsions: Secondary | ICD-10-CM | POA: Insufficient documentation

## 2022-05-12 DIAGNOSIS — I10 Essential (primary) hypertension: Secondary | ICD-10-CM | POA: Insufficient documentation

## 2022-05-12 DIAGNOSIS — K219 Gastro-esophageal reflux disease without esophagitis: Secondary | ICD-10-CM | POA: Diagnosis not present

## 2022-05-12 DIAGNOSIS — G35 Multiple sclerosis: Secondary | ICD-10-CM | POA: Insufficient documentation

## 2022-05-12 DIAGNOSIS — G300 Alzheimer's disease with early onset: Secondary | ICD-10-CM | POA: Insufficient documentation

## 2022-05-12 DIAGNOSIS — Z993 Dependence on wheelchair: Secondary | ICD-10-CM | POA: Diagnosis not present

## 2022-05-12 DIAGNOSIS — Z01419 Encounter for gynecological examination (general) (routine) without abnormal findings: Secondary | ICD-10-CM | POA: Diagnosis not present

## 2022-05-12 DIAGNOSIS — Z90710 Acquired absence of both cervix and uterus: Secondary | ICD-10-CM | POA: Diagnosis not present

## 2022-05-12 DIAGNOSIS — F0280 Dementia in other diseases classified elsewhere without behavioral disturbance: Secondary | ICD-10-CM | POA: Insufficient documentation

## 2022-05-12 DIAGNOSIS — Z87891 Personal history of nicotine dependence: Secondary | ICD-10-CM | POA: Diagnosis not present

## 2022-05-12 DIAGNOSIS — Z124 Encounter for screening for malignant neoplasm of cervix: Secondary | ICD-10-CM | POA: Insufficient documentation

## 2022-05-12 DIAGNOSIS — E119 Type 2 diabetes mellitus without complications: Secondary | ICD-10-CM | POA: Insufficient documentation

## 2022-05-12 LAB — BASIC METABOLIC PANEL
Anion gap: 10 (ref 5–15)
BUN: 11 mg/dL (ref 6–20)
CO2: 25 mmol/L (ref 22–32)
Calcium: 9 mg/dL (ref 8.9–10.3)
Chloride: 106 mmol/L (ref 98–111)
Creatinine, Ser: 0.36 mg/dL — ABNORMAL LOW (ref 0.44–1.00)
GFR, Estimated: >60 mL/min (ref >60)
Glucose, Bld: 114 mg/dL — ABNORMAL HIGH (ref 70–99)
Potassium: 3.7 mmol/L (ref 3.5–5.1)
Sodium: 141 mmol/L (ref 135–145)

## 2022-05-12 LAB — CBC
HCT: 46.4 % — ABNORMAL HIGH (ref 36.0–46.0)
Hemoglobin: 15.3 g/dL — ABNORMAL HIGH (ref 12.0–15.0)
MCH: 30.4 pg (ref 26.0–34.0)
MCHC: 33 g/dL (ref 30.0–36.0)
MCV: 92.2 fL (ref 80.0–100.0)
Platelets: 175 10*3/uL (ref 150–400)
RBC: 5.03 MIL/uL (ref 3.87–5.11)
RDW: 13.9 % (ref 11.5–15.5)
WBC: 6 10*3/uL (ref 4.0–10.5)
nRBC: 0 % (ref 0.0–0.2)

## 2022-05-12 SURGERY — EXAM UNDER ANESTHESIA
Anesthesia: General | Site: Vagina

## 2022-05-12 MED ORDER — LIDOCAINE 2% (20 MG/ML) 5 ML SYRINGE
INTRAMUSCULAR | Status: AC
  Filled 2022-05-12: qty 5

## 2022-05-12 MED ORDER — LACTATED RINGERS IV SOLN: INTRAVENOUS | Status: DC

## 2022-05-12 MED ORDER — LIDOCAINE 2% (20 MG/ML) 5 ML SYRINGE
INTRAMUSCULAR | Status: DC | PRN
  Administered 2022-05-12: 40 mg via INTRAVENOUS

## 2022-05-12 MED ORDER — PROPOFOL 10 MG/ML IV BOLUS
INTRAVENOUS | Status: AC
  Filled 2022-05-12: qty 20

## 2022-05-12 MED ORDER — MIDAZOLAM HCL 2 MG/2ML IJ SOLN
INTRAMUSCULAR | Status: AC
  Filled 2022-05-12: qty 2

## 2022-05-12 MED ORDER — CHLORHEXIDINE GLUCONATE 0.12 % MT SOLN
15.0000 mL | Freq: Once | OROMUCOSAL | Status: AC
  Administered 2022-05-12: 15 mL via OROMUCOSAL
  Filled 2022-05-12: qty 15

## 2022-05-12 MED ORDER — FENTANYL CITRATE (PF) 250 MCG/5ML IJ SOLN
INTRAMUSCULAR | Status: AC
  Filled 2022-05-12: qty 5

## 2022-05-12 MED ORDER — BACITRACIN ZINC 500 UNIT/GM EX OINT
TOPICAL_OINTMENT | CUTANEOUS | Status: AC
  Filled 2022-05-12: qty 28.35

## 2022-05-12 MED ORDER — MIDAZOLAM HCL 2 MG/2ML IJ SOLN
INTRAMUSCULAR | Status: DC | PRN
  Administered 2022-05-12 (×2): .5 mg via INTRAVENOUS

## 2022-05-12 MED ORDER — PROPOFOL 1000 MG/100ML IV EMUL
INTRAVENOUS | Status: AC
  Filled 2022-05-12: qty 100

## 2022-05-12 MED ORDER — PROPOFOL 500 MG/50ML IV EMUL
INTRAVENOUS | Status: DC | PRN
  Administered 2022-05-12: 50 ug/kg/min via INTRAVENOUS

## 2022-05-12 MED ORDER — ORAL CARE MOUTH RINSE: 15.0000 mL | Freq: Once | OROMUCOSAL | Status: AC

## 2022-05-12 MED ORDER — PROPOFOL 10 MG/ML IV BOLUS
INTRAVENOUS | Status: DC | PRN
  Administered 2022-05-12 (×2): 50 mg via INTRAVENOUS

## 2022-05-12 SURGICAL SUPPLY — 3 items
DRAPE HYSTEROSCOPY (MISCELLANEOUS) IMPLANT
SURGILUBE 2OZ TUBE FLIPTOP (MISCELLANEOUS) IMPLANT
TOWEL GREEN STERILE (TOWEL DISPOSABLE) IMPLANT

## 2022-05-12 NOTE — Transfer of Care (Signed)
Immediate Anesthesia Transfer of Care Note  Patient: Analeah Brame  Procedure(s) Performed: EXAM UNDER ANESTHESIA WITH PAP SMEAR (Vagina )  Patient Location: PACU  Anesthesia Type:MAC  Level of Consciousness: awake, alert , patient cooperative, and responds to stimulation  Airway & Oxygen Therapy: Patient Spontanous Breathing and Patient connected to nasal cannula oxygen  Post-op Assessment: Report given to RN, Post -op Vital signs reviewed and stable, and Patient moving all extremities X 4  Post vital signs: Reviewed and stable  Last Vitals:  Vitals Value Taken Time  BP 145/89 05/12/22 1004  Temp    Pulse 84 05/12/22 1006  Resp 12 05/12/22 1006  SpO2 97 % 05/12/22 1006  Vitals shown include unvalidated device data.  Last Pain:  Vitals:   05/12/22 0715  TempSrc:   PainSc: 10-Worst pain ever      Patients Stated Pain Goal: 0 (18/36/72 5500)  Complications: No notable events documented.

## 2022-05-12 NOTE — Interval H&P Note (Signed)
History and Physical Interval Note:  05/12/2022 8:29 AM  Brandy Mckinney  has presented today for surgery, with the diagnosis of Routine Gynecological Examination.  The various methods of treatment have been discussed with the patient and family. After consideration of risks, benefits and other options for treatment, the patient has consented to  Procedure(s): EXAM UNDER ANESTHESIA WITH PAP SMEAR (N/A) as a surgical intervention.  The patient's history has been reviewed, patient examined, no change in status, stable for surgery.  I have reviewed the patient's chart and labs.  Questions were answered to the patient's satisfaction.     Drema Dallas

## 2022-05-12 NOTE — Anesthesia Procedure Notes (Signed)
Procedure Name: MAC Date/Time: 05/12/2022 9:02 AM  Performed by: Michele Rockers, CRNAPre-anesthesia Checklist: Patient identified, Emergency Drugs available, Suction available, Timeout performed and Patient being monitored Patient Re-evaluated:Patient Re-evaluated prior to induction Oxygen Delivery Method: Simple face mask

## 2022-05-12 NOTE — Op Note (Signed)
Pre Op Dx:   1. Screening for cervical cancer 2. Limited mobility 3. Multiple sclerosis 4. Early onset Alzheimer's dementia 5. History of hysterectomy 6. Constipation  Post Op Dx:   Same as pre-operative diagnoses  Procedure:   1. Exam under anesthesia 2. Disimpaction of bowel   Surgeon:  Dr. Drema Dallas Assistants:  None Anesthesia:  Sedation   EBL:  None IVF:  See anesthesia documentation UOP:  100cc via in-and-out foley catheter   Drains:  In-and-out catheter Specimen removed:  Vaginal pap smear - sent to cytology Device(s) implanted: None Case Type:  Clean-contaminated Findings:  Normal-appearing external genitalia with atrophic changes noted, vaginal mucosa pale without rugae, no visible/palpable cervix, no masses palpated in bilateral adnexa, Stage I-II cystocele. Significant stool burden palpated in vaginal vault. Complications: None Indications:  56 y.o. G3P3 with multiple sclerosis and significant mobile limitations in need of exam under anesthesia and pap smear screening. Patient has a history of hysterectomy over 20 years ago and her caretaker (brother) unsure if cervix is still present.  Procedure:  After informed consent was obtained the patient was taken to the operating room in the dorsal supine position.  After administration of anesthesia, the patient was placed in the dorsal lithotomy position. A pre-operative time-out was completed. Due to significant stool burden palpated vaginally, bowel disimpaction performed with significant stool evacuated. The stool appeared normal and soft and brown without any blood within. The urethra was then prepped with iodine and her bladder was drained using an in-and-out foley catheter. Bimanual exam performed with findings as documented above. Speculum used but no visible cervix - left vaginal apex with dimple present. Pap smear collected using the broom and flat spatula and was sent to cytology. The patient was awakened and  appeared to have tolerated the procedure well.  All counts were correct.  Disposition:  PACU  Comments: Per the request of patient's caregiver (brother, Marcello Moores) a care order for nursing to clip finger and toenails placed while patient was under sedation as patient as significant pain with nail clippings while awake.  Drema [place], DO

## 2022-05-13 ENCOUNTER — Ambulatory Visit
Admission: RE | Admit: 2022-05-13 | Discharge: 2022-05-13 | Disposition: A | Discharge status: Home / Self Care | Payer: Medicare Other | Source: Ambulatory Visit | Attending: Family Medicine | Admitting: Family Medicine

## 2022-05-13 DIAGNOSIS — Z1231 Encounter for screening mammogram for malignant neoplasm of breast: Secondary | ICD-10-CM

## 2022-05-14 NOTE — Anesthesia Preprocedure Evaluation (Signed)
Anesthesia Evaluation  Patient identified by MRN, date of birth, ID band Patient awake    Reviewed: Allergy & Precautions, NPO status , Patient's Chart, lab work & pertinent test results  Airway Mallampati: III  TM Distance: >3 FB Neck ROM: Full    Dental  (+) Dental Advisory Given   Pulmonary former smoker,    breath sounds clear to auscultation       Cardiovascular hypertension, (-) angina(-) Past MI  Rhythm:Regular     Neuro/Psych Seizures -, Well Controlled,  PSYCHIATRIC DISORDERS Depression Dementia MS  Neuromuscular disease    GI/Hepatic Neg liver ROS, GERD  Medicated,  Endo/Other  diabetes  Renal/GU negative Renal ROS     Musculoskeletal negative musculoskeletal ROS (+)   Abdominal   Peds  Hematology Lab Results      Component                Value               Date                      WBC                      6.0                 05/12/2022                HGB                      15.3 (H)            05/12/2022                HCT                      46.4 (H)            05/12/2022                MCV                      92.2                05/12/2022                PLT                      175                 05/12/2022             Anesthesia Other Findings   Reproductive/Obstetrics                             Anesthesia Physical Anesthesia Plan  ASA: 3  Anesthesia Plan: General   Post-op Pain Management: Minimal or no pain anticipated   Induction: Intravenous  PONV Risk Score and Plan: 3 and Ondansetron and Dexamethasone  Airway Management Planned: Mask  Additional Equipment: None  Intra-op Plan:   Post-operative Plan:   Informed Consent: I have reviewed the patients History and Physical, chart, labs and discussed the procedure including the risks, benefits and alternatives for the proposed anesthesia with the patient or authorized representative who has indicated  his/her understanding and acceptance.     Dental advisory given  Plan Discussed with: CRNA  Anesthesia Plan  Comments:         Anesthesia Quick Evaluation

## 2022-05-14 NOTE — Anesthesia Postprocedure Evaluation (Signed)
Anesthesia Post Note  Patient: Brandy Mckinney  Procedure(s) Performed: EXAM UNDER ANESTHESIA WITH PAP SMEAR (Vagina )     Patient location during evaluation: PACU Anesthesia Type: General Level of consciousness: awake and alert Pain management: pain level controlled Vital Signs Assessment: post-procedure vital signs reviewed and stable Respiratory status: spontaneous breathing, nonlabored ventilation and respiratory function stable Cardiovascular status: blood pressure returned to baseline and stable Postop Assessment: no apparent nausea or vomiting Anesthetic complications: no   No notable events documented.  Last Vitals:  Vitals:   05/12/22 1005 05/12/22 1015  BP: (!) 145/89 (!) 151/87  Pulse: 88 81  Resp: 10 13  Temp: 36.6 C 36.7 C  SpO2: 99% 98%    Last Pain:  Vitals:   05/12/22 1015  TempSrc:   PainSc: 0-No pain                 Mickeal Daws

## 2022-05-17 ENCOUNTER — Other Ambulatory Visit: Payer: Self-pay | Admitting: Neurology

## 2022-05-17 LAB — CYTOLOGY - PAP
Comment: NEGATIVE
Diagnosis: NEGATIVE
High risk HPV: NEGATIVE

## 2022-05-20 ENCOUNTER — Encounter: Payer: Self-pay | Admitting: Neurology

## 2022-05-20 ENCOUNTER — Other Ambulatory Visit: Payer: Self-pay | Admitting: Family Medicine

## 2022-05-20 ENCOUNTER — Ambulatory Visit (INDEPENDENT_AMBULATORY_CARE_PROVIDER_SITE_OTHER): Payer: Medicare Other | Admitting: Neurology

## 2022-05-20 VITALS — BP 137/101 | HR 91 | Ht 61.0 in

## 2022-05-20 DIAGNOSIS — Z79899 Other long term (current) drug therapy: Secondary | ICD-10-CM | POA: Diagnosis not present

## 2022-05-20 DIAGNOSIS — R569 Unspecified convulsions: Secondary | ICD-10-CM

## 2022-05-20 DIAGNOSIS — E559 Vitamin D deficiency, unspecified: Secondary | ICD-10-CM

## 2022-05-20 DIAGNOSIS — G35 Multiple sclerosis: Secondary | ICD-10-CM

## 2022-05-20 DIAGNOSIS — F0280 Dementia in other diseases classified elsewhere without behavioral disturbance: Secondary | ICD-10-CM

## 2022-05-20 DIAGNOSIS — R928 Other abnormal and inconclusive findings on diagnostic imaging of breast: Secondary | ICD-10-CM

## 2022-05-20 NOTE — Progress Notes (Signed)
[facility]  PATIENT: Brandy Mckinney DOB: [birth year]  REFERRING DOCTOR OR PCP: Salli Real MD SOURCE: Patient, notes from primary care, imaging and lab reports  _________________________________   HISTORICAL  CHIEF COMPLAINT:  Chief Complaint  Patient presents with   Follow-up    RM 2. Last seen 10/21/21 MS DMT: Mavenclad. Wheelchair bound. Just had mammogram last week and got dropped during appt. Has some cuts/bruises but healing. Doing okay.    HISTORY OF PRESENT ILLNESS:  Brandy Mckinney is a 56 y.o. woman with multiple sclerosis.  Update 05/20/2022 She had her first Mavenclad course in December 2020/January 2021.  She had a second year December 2021 and  January 2022.  Both times she tolerated the pills well.  She had no side effects.      ALC was normal 0.9 11/22/2021   ALT/AST normal 10/21/2021.     She has had no infection.  She had a mammogram last week and fell/was dropped during the transfer.  She had an abrasion.    She has had her flu and Covid shot.   She had the first shingles shot.      She is wheelchair bound.   She does not help with transfers.   She has left arm > right arm weakness and both legs weak, worse on left.    She was experiencing dysesthesias in her legs.  Gabapentin has helped her dysesthesias.   No change in her bladder pattern (she is incontinent) they have not noticed any skin breakdown .  She continues to experience intermittent flank pain, similar to pain she had late last year but milder  She has incontinence and wears Depends.     She has constipation.  Cognition is about with reduced STM, focus/attention.   She often has mild confusion.   She jumbles her words and also has word finding difficulties  She has no recent seizures.   She takes levetiracetam and tolerates it well     MS history:  She was diagnosed with MS at age 20 (56) after presenting with optic neuritis in both eyes.  Over the next few years, she began  to experience more difficulty with her gait.  This steadily progressed in her 31s 30s and 62s.  For many years, she used a walker.  About 7 or 8 years ago after repeated falls she stopped using the walker and became wheelchair-bound.  At some point, she was started on Avonex.  She remained on this medication for many years.  She continued to progress and was switched to Tecfidera.  She stayed on that medication for several years and then in 2017 was switched to Ocrevus. Travel was difficult for her to do. In 2020 she switched to [facility]. She had the first course December 2020 in January 2021.   Other neurologic history: She has a history of seizures. They have been well controlled on levetiracetam.  Data: MRI of the brain dated 02/19/2019 [facility]): It shows multiple T2/flair hyperintense foci in the brainstem, cerebellum and in the periventricular, juxtacortical and deep white matter of the hemispheres in a pattern compatible with MS.  There were no enhancing lesions.  MRI of the brain 11/22/2021 and 04/2020 were unchanged.     I reviewed lab tests from 02/19/2019: CBC was normal.  Lymphocyte count was normal.  She had an mildly elevated ALT but normal AST.  Vitamin D was normal.  Hepatitis Be antigen and hepatitis B core antibody were negative.  Hep  C antibody and RNA was negative.  HIV was negative.  QuantiFERON TB was negative.  Varicella-zoster IgG showed immunity.   REVIEW OF SYSTEMS: Constitutional: No fevers, chills, sweats, or change in appetite.  She has some insomnia and irregular sleep. Eyes: No eye pain.  She has reduced visual acuity. Ear, nose and throat: No hearing loss, ear pain, nasal congestion, sore throat Cardiovascular: No chest pain, palpitations Respiratory:  No shortness of breath at rest or with exertion.   No wheezes GastrointestinaI: No nausea, vomiting, diarrhea, abdominal pain, fecal incontinence Genitourinary: She has incontinence  musculoskeletal:  She notes myalgias  integumentary: No rash, pruritus, skin lesions Neurological: as above Psychiatric: No depression at this time.  No anxiety Endocrine: No palpitations, diaphoresis, change in appetite, change in weigh or increased thirst Hematologic/Lymphatic:  No anemia, purpura, petechiae. Allergic/Immunologic: No itchy/runny eyes, nasal congestion, recent allergic reactions, rashes  ALLERGIES: Allergies  Allergen Reactions   Fentanyl Other (See Comments)     can not have anything on her skin    HOME MEDICATIONS:  Current Outpatient Medications:    Ascorbic Acid (VITAMIN C) 1000 MG tablet, Take 1,000 mg by mouth in the morning., Disp: , Rfl:    Cholecalciferol (VITAMIN D3) 50 MCG (2000 UT) TABS, Take 2,000 Units by mouth daily., Disp: , Rfl:    Cranberry 500 MG TABS, Take 1 tablet by mouth daily., Disp: , Rfl:    cyclobenzaprine (FLEXERIL) 10 MG tablet, Take 10 mg by mouth at bedtime., Disp: , Rfl:    etodolac (LODINE) 400 MG tablet, Take 1 tablet (400 mg total) by mouth 2 (two) times daily as needed., Disp: 180 tablet, Rfl: 3   FLUoxetine (PROZAC) 20 MG capsule, Take 20 mg by mouth in the morning., Disp: , Rfl:    gabapentin (NEURONTIN) 300 MG capsule, Take 1 capsule (300 mg total) by mouth 3 (three) times daily. (Patient taking differently: Take 300-600 mg by mouth 2 (two) times daily.), Disp: 270 capsule, Rfl: 3   Inulin (FIBER CHOICE PO), Take 1 tablet by mouth in the morning., Disp: , Rfl:    levETIRAcetam (KEPPRA) 1000 MG tablet, TAKE 1 TABLET BY MOUTH TWICE A DAY, Disp: 180 tablet, Rfl: 1   Multiple Vitamin (MULTIVITAMIN WITH MINERALS) TABS tablet, Take 1 tablet by mouth in the morning., Disp: , Rfl:    omeprazole (PRILOSEC) 20 MG capsule, Take 20 mg by mouth daily before breakfast., Disp: , Rfl:    Polyethyl Glycol-Propyl Glycol (LUBRICANT EYE DROPS) 0.4-0.3 % SOLN, Place 1-2 drops into both eyes 3 (three) times daily as needed (dry/irritated eyes.)., Disp: , Rfl:     simvastatin (ZOCOR) 20 MG tablet, Take 20 mg by mouth every evening., Disp: , Rfl:    triamcinolone cream (KENALOG) 0.1 %, Apply 1 Application topically daily. 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 No pertinent family history.  SOCIAL HISTORY:  Social History   Socioeconomic History   Marital status: Divorced    Spouse name: Not on file   Number of children: Not on file   Years of education: Not on file   Highest education level: Not on file  Occupational History   Not on file  Tobacco Use   Smoking status: Former    Types: Cigarettes    Quit date: 2012    Years since quitting: 11.8   Smokeless tobacco: Never  Vaping Use   Vaping Use: Never used  Substance and Sexual Activity   Alcohol use: Not Currently   Drug use: Never   Sexual activity: Not on file  Other Topics Concern   Not on file  Social History Narrative   Lives at home with brother tommy    Right handed    Social Determinants of Health   Financial Resource Strain: Not on file  Food Insecurity: Not on file  Transportation Needs: Not on file  Physical Activity: Not on file  Stress: Not on file  Social Connections: Not on file  Intimate Partner Violence: Not on file     PHYSICAL EXAM  Vitals:   05/20/22 0929  BP: (!) 137/101  Pulse: 91  SpO2: 97%  Height: 5\' 1"  (1.549 m)    Body mass index is 35.9 kg/m.   General: The patient is well-developed and well-nourished and in no acute distress  HEENT:  Head is [place]/AT.  Sclera are anicteric.     Musculoskeletal: She has some muscle tenderness on deep palpation.   Neurologic Exam  Mental status: The patient is alert and oriented to name  and city but not date at the time of the examination.  She had reduced focus and attention and reduced immediate recall.   Speech is normal.  Cranial nerves: She has L > R  INO.  There is good facial sensation to soft touch bilaterally facial strength and sensation was normal..  Trapezius and sternocleidomastoid strength is normal.  No obvious hearing deficits are noted.  Motor:  Muscle bulk is normal.   Muscle tone is increased, left greater than right.  Strength was 3/5 in the left arm and 2+ to 3/5 in the left leg.  Strength was 4/5 in the righbiceps but 3/5 elsewhere in the right arm  Right leg 3 to 4-/5  Sensory: Sensory testing is intact to pinprick, soft touch and vibration sensation in limbs  Coordination: Cerebellar testing shows poor heel-to-shin  Gait and station: unable to stand or walk  Reflexes: Deep tedon reflexes are increased in the legs..         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  MS is stable.   She did Mavenclad in 2021 and 2022 without complications 2.   Continue Keppra.  No recent seizures 3.   Continue gabapentin  300 mg po tid as it may help with the dysesthesias better 4.   rtc 12  months or sooner if there are new or worsening neurologic symptoms.      Isack Lavalley A. 2023, MD, [facility] 05/20/2022, 10:29 AM Certified in Neurology, Clinical Neurophysiology, Sleep Medicine and Neuroimaging  [facility] [address] [place], [place] [phone]

## 2022-06-10 ENCOUNTER — Other Ambulatory Visit: Payer: Self-pay

## 2022-06-10 MED ORDER — LEVETIRACETAM 1000 MG PO TABS: 1000.0000 mg | ORAL_TABLET | Freq: Two times a day (BID) | ORAL | 1 refills | Status: DC

## 2022-06-10 MED ORDER — ETODOLAC 400 MG PO TABS: 400.0000 mg | ORAL_TABLET | Freq: Two times a day (BID) | ORAL | 3 refills | Status: DC | PRN

## 2022-06-10 MED ORDER — GABAPENTIN 300 MG PO CAPS: 300.0000 mg | ORAL_CAPSULE | Freq: Three times a day (TID) | ORAL | 3 refills | Status: DC

## 2022-07-23 ENCOUNTER — Ambulatory Visit
Admission: RE | Admit: 2022-07-23 | Discharge: 2022-07-23 | Disposition: A | Discharge status: Home / Self Care | Payer: Medicare Other | Source: Ambulatory Visit | Attending: Family Medicine | Admitting: Family Medicine

## 2022-07-23 ENCOUNTER — Other Ambulatory Visit: Payer: Self-pay | Admitting: Family Medicine

## 2022-07-23 DIAGNOSIS — R928 Other abnormal and inconclusive findings on diagnostic imaging of breast: Secondary | ICD-10-CM

## 2022-09-12 ENCOUNTER — Other Ambulatory Visit: Payer: Self-pay | Admitting: Neurology

## 2022-09-13 NOTE — Telephone Encounter (Signed)
Last seen on 05/20/2022 Follow up scheduled on 05/24/23

## 2022-10-13 ENCOUNTER — Telehealth: Payer: Self-pay | Admitting: *Deleted

## 2022-10-13 NOTE — Telephone Encounter (Signed)
Gave completed/signed "[employer] [facility] [employer]" form back to medical records to process for pt.

## 2022-10-14 ENCOUNTER — Telehealth: Payer: Self-pay | Admitting: *Deleted

## 2022-10-14 NOTE — Telephone Encounter (Signed)
Pt form faxed on 10/14/22 copy @ the front desk for p/u

## 2022-10-27 ENCOUNTER — Emergency Department (HOSPITAL_COMMUNITY)
Admission: EM | Admit: 2022-10-27 | Discharge: 2022-10-27 | Disposition: A | Discharge status: Home / Self Care | Payer: 59 | Attending: Emergency Medicine | Admitting: Emergency Medicine

## 2022-10-27 ENCOUNTER — Emergency Department (HOSPITAL_COMMUNITY): Payer: 59

## 2022-10-27 ENCOUNTER — Other Ambulatory Visit: Payer: Self-pay

## 2022-10-27 ENCOUNTER — Encounter (HOSPITAL_COMMUNITY): Payer: Self-pay | Admitting: Emergency Medicine

## 2022-10-27 DIAGNOSIS — E876 Hypokalemia: Secondary | ICD-10-CM | POA: Insufficient documentation

## 2022-10-27 DIAGNOSIS — I10 Essential (primary) hypertension: Secondary | ICD-10-CM | POA: Insufficient documentation

## 2022-10-27 DIAGNOSIS — G35 Multiple sclerosis: Secondary | ICD-10-CM | POA: Diagnosis not present

## 2022-10-27 DIAGNOSIS — Z79899 Other long term (current) drug therapy: Secondary | ICD-10-CM | POA: Insufficient documentation

## 2022-10-27 LAB — CBC
HCT: 45.7 % (ref 36.0–46.0)
Hemoglobin: 15.2 g/dL — ABNORMAL HIGH (ref 12.0–15.0)
MCH: 30.8 pg (ref 26.0–34.0)
MCHC: 33.3 g/dL (ref 30.0–36.0)
MCV: 92.5 fL (ref 80.0–100.0)
Platelets: 193 10*3/uL (ref 150–400)
RBC: 4.94 MIL/uL (ref 3.87–5.11)
RDW: 13.7 % (ref 11.5–15.5)
WBC: 7.7 10*3/uL (ref 4.0–10.5)
nRBC: 0 % (ref 0.0–0.2)

## 2022-10-27 LAB — BASIC METABOLIC PANEL
Anion gap: 13 (ref 5–15)
BUN: 8 mg/dL (ref 6–20)
CO2: 25 mmol/L (ref 22–32)
Calcium: 9 mg/dL (ref 8.9–10.3)
Chloride: 103 mmol/L (ref 98–111)
Creatinine, Ser: 0.51 mg/dL (ref 0.44–1.00)
GFR, Estimated: >60 mL/min (ref >60)
Glucose, Bld: 142 mg/dL — ABNORMAL HIGH (ref 70–99)
Potassium: 3.4 mmol/L — ABNORMAL LOW (ref 3.5–5.1)
Sodium: 141 mmol/L (ref 135–145)

## 2022-10-27 MED ORDER — AMLODIPINE BESYLATE 5 MG PO TABS
2.5000 mg | ORAL_TABLET | Freq: Once | ORAL | Status: AC
  Administered 2022-10-27: 2.5 mg via ORAL
  Filled 2022-10-27: qty 1

## 2022-10-27 MED ORDER — AMLODIPINE BESYLATE 2.5 MG PO TABS: 2.5000 mg | ORAL_TABLET | Freq: Every day | ORAL | 0 refills | Status: DC

## 2022-10-27 NOTE — ED Provider Notes (Signed)
[facility] Provider Note   CSN: [id number] Arrival date & time: 10/27/22  1450     History  Chief Complaint  Patient presents with   Hypertension    Brandy Mckinney is a 57 y.o. female With medical history of hyperlipidemia, hypertension, multiple sclerosis, seizures.  Patient presents to ED for evaluation of high blood pressure and headache.  Patient here with friend of the family provide some of the history.  Per patient's friend, patient has had high blood pressure readings for the last 2 weeks.  The patient friend reports that the patient has had no issues with blood pressure prior to the last 2 weeks.  Patient Brandy Mckinney goes on to state that the patient has had headaches on and off for the last 2 weeks as well.  The patient and her friend attempted to get seen this morning at her PCPs office however they were unable to see the patient so they came to the ED.  Patient denies her taking medication for high blood pressure.  Patient friend reports that her blood pressure readings have been in the [id number], [id number] systolic for the last 2 weeks when they will check in the morning and at night.  The patient is reporting a headache rated at 11 out of 10 at its worst.  Patient denies any headache at this time.  Patient denies chest pain, shortness of breath, blurred vision.  Patient denies fevers, nausea, vomiting, diarrhea, abdominal pain.   Hypertension Associated symptoms include headaches. Pertinent negatives include no chest pain and no shortness of breath.       Home Medications Prior to Admission medications   Medication Sig Start Date End Date Taking? Authorizing Provider  amLODipine (NORVASC) 2.5 MG tablet Take 1 tablet (2.5 mg total) by mouth daily. 10/27/22  Yes Azucena Cecil, PA-C  Ascorbic Acid (VITAMIN C) 1000 MG tablet Take 1,000 mg by mouth in the morning.    [provider]  Cholecalciferol (VITAMIN D3) 50 MCG (2000  UT) TABS Take 2,000 Units by mouth daily.    [provider]  Cranberry 500 MG TABS Take 1 tablet by mouth daily.    [provider]  cyclobenzaprine (FLEXERIL) 10 MG tablet Take 10 mg by mouth at bedtime. 04/17/19   [provider]  etodolac (LODINE) 400 MG tablet Take 1 tablet (400 mg total) by mouth 2 (two) times daily as needed. 06/10/22   Sater, Nanine Means, MD  FLUoxetine (PROZAC) 20 MG capsule Take 20 mg by mouth in the morning. 12/16/17   [provider]  gabapentin (NEURONTIN) 300 MG capsule Take 1 capsule (300 mg total) by mouth 3 (three) times daily. 06/10/22   Sater, Nanine Means, MD  Inulin (FIBER CHOICE PO) Take 1 tablet by mouth in the morning.    [provider]  levETIRAcetam (KEPPRA) 1000 MG tablet TAKE 1 TABLET BY MOUTH TWICE  DAILY 09/13/22   Sater, Nanine Means, MD  Multiple Vitamin (MULTIVITAMIN WITH MINERALS) TABS tablet Take 1 tablet by mouth in the morning.    [provider]  omeprazole (PRILOSEC) 20 MG capsule Take 20 mg by mouth daily before breakfast. 01/27/21   [provider]  Polyethyl Glycol-Propyl Glycol (LUBRICANT EYE DROPS) 0.4-0.3 % SOLN Place 1-2 drops into both eyes 3 (three) times daily as needed (dry/irritated eyes.).    [provider]  simvastatin (ZOCOR) 20 MG tablet Take 20 mg by mouth every evening. 05/09/13   [provider]  triamcinolone cream (KENALOG) 0.1 % Apply 1 Application topically daily. After bathing    [provider]      Allergies    Fentanyl    Review of Systems   Review of Systems  Eyes:  Negative for visual disturbance.  Respiratory:  Negative for shortness of breath.   Cardiovascular:  Negative for chest pain.  Neurological:  Positive for headaches.  All other systems reviewed and are negative.   Physical Exam Updated Vital Signs BP (!) 138/97 (BP Location: Right Arm)   Pulse 96   Temp 98.3 F (36.8 C) (Oral)   Resp 17   Ht 5\' 2"  (1.575 m)    Wt 81.6 kg   SpO2 95%   BMI 32.92 kg/m  Physical Exam Vitals and nursing note reviewed.  Constitutional:      General: She is not in acute distress.    Appearance: Normal appearance. She is not ill-appearing, toxic-appearing or diaphoretic.  HENT:     Head: Normocephalic and atraumatic.     Nose: Nose normal.     Mouth/Throat:     Mouth: Mucous membranes are moist.     Pharynx: Oropharynx is clear.  Eyes:     Extraocular Movements: Extraocular movements intact.     Conjunctiva/sclera: Conjunctivae normal.     Pupils: Pupils are equal, round, and reactive to light.  Cardiovascular:     Rate and Rhythm: Normal rate and regular rhythm.  Pulmonary:     Effort: Pulmonary effort is normal.     Breath sounds: Normal breath sounds. No wheezing.  Abdominal:     General: Abdomen is flat. Bowel sounds are normal.     Palpations: Abdomen is soft.     Tenderness: There is no abdominal tenderness.  Musculoskeletal:     Cervical back: Normal range of motion and neck supple.  Skin:    General: Skin is warm and dry.     Capillary Refill: Capillary refill takes less than 2 seconds.  Neurological:     Mental Status: She is alert. Mental status is at baseline.     Comments: Neurologically at baseline per patient, patient guardian     ED Results / Procedures / Treatments   Labs (all labs ordered are listed, but only abnormal results are displayed) Labs Reviewed  CBC - Abnormal; Notable for the following components:      Result Value   Hemoglobin 15.2 (*)    All other components within normal limits  BASIC METABOLIC PANEL - Abnormal; Notable for the following components:   Potassium 3.4 (*)    Glucose, Bld 142 (*)    All other components within normal limits    EKG EKG Interpretation  Date/Time:  Wednesday October 27 2022 14:58:39 EDT Ventricular Rate:  90 PR Interval:  156 QRS Duration: 94 QT Interval:  379 QTC Calculation: 464 R Axis:   32 Text Interpretation: Sinus rhythm  Consider left atrial enlargement Abnormal R-wave progression, early transition Confirmed by Tretha Sciara [phone]) on 10/27/2022 5:23:00 PM  Radiology CT Head Wo Contrast  Result Date: 10/27/2022 CLINICAL DATA:  Headache and hyperdense patent. EXAM: CT HEAD WITHOUT CONTRAST TECHNIQUE: Contiguous axial images were obtained from the base of the skull through the vertex without intravenous contrast. RADIATION DOSE REDUCTION: This exam was performed according to the departmental dose-optimization program which includes automated exposure control, adjustment of the mA and/or kV according to patient size and/or use of iterative reconstruction technique. COMPARISON:  Brain MRI 11/22/2021 FINDINGS: Brain:  There is no acute intracranial hemorrhage, extra-axial fluid collection, or acute infarct. There is parenchymal volume loss with prominence of the ventricular system and extra-axial CSF spaces primarily affecting the frontal lobes, unchanged. Corpus callosal volume loss is also again noted. Confluent hypodensity in the supratentorial white matter likely reflects sequela of chronic small-vessel ischemic change, significantly accelerated for age. The pituitary and suprasellar region are normal. There is no mass lesion. There is no mass effect or midline shift. Vascular: There is calcification of the bilateral carotid siphons. Skull: Normal. Negative for fracture or focal lesion. Sinuses/Orbits: The imaged paranasal sinuses are clear. The globes and orbits are unremarkable. Other: None. IMPRESSION: 1. No acute intracranial pathology. 2. Unchanged age advanced parenchymal volume loss primarily affecting the frontal lobes and chronic small-vessel ischemic change. Electronically Signed   By: Valetta Mole M.D.   On: 10/27/2022 16:35    Procedures Procedures   Medications Ordered in ED Medications  amLODipine (NORVASC) tablet 2.5 mg (2.5 mg Oral Given 10/27/22 1756)    ED Course/ Medical Decision Making/ A&P Clinical  Course as of 10/27/22 1838  Wed Oct 27, 2022  1722 Stable 2 weeks of HTN episodes and headaches. Currently asymptomatic [CC]    Clinical Course User Index [CC] Tretha Sciara, MD    Medical Decision Making  57 year old female presents ED for evaluation.  Please see HPI for further details.  On examination the patient is afebrile and nontachycardic.  Lung sounds are clear bilaterally, she is not hypoxic.  Abdomen soft and compressible throughout.  Neurological examination at baseline.  Patient not toxic in appearance.   CBC without leukocytosis, stable BMP aside slightly decreased potassium 3.4.  CT head without contrast ordered due to headache and the setting of hypertension shows no evidence of intracranial pathology.  Patient provided 2.5 milligrams amlodipine.  Patient blood pressure has reduced here.  Patient will be sent home with 2 and half milligrams amlodipine and advised to follow-up with her PCP for reevaluation.  Patient given return precautions and she voiced understanding.  Patient had all of her questions answered to her satisfaction.  Patient stable to discharge home.    Final Clinical Impression(s) / ED Diagnoses Final diagnoses:  Hypertension, unspecified type    Rx / DC Orders ED Discharge Orders          Ordered    amLODipine (NORVASC) 2.5 MG tablet  Daily        10/27/22 1837              Azucena Cecil, PA-C 10/27/22 Zacarias Pontes, MD 10/28/22 [phone]

## 2022-10-27 NOTE — ED Notes (Signed)
Got patient into a gown on the monitor did EKG shown to er provider patient is resting with call bell in reach  

## 2022-10-27 NOTE — ED Notes (Signed)
[facility] called  

## 2022-10-27 NOTE — ED Notes (Signed)
Patient transported to CT 

## 2022-10-27 NOTE — Discharge Instructions (Signed)
Please return to the ED with any new symptoms of chest pain, shortness of breath, blurred vision or continued headache Please continue monitoring your blood pressure at home.  Please utilize blood pressure record sheet I have attached to this form to document your blood pressures.  Please take this form and follow-up with your PCP. Please read the attached form concerning hypertension for more information Please begin taking 2 and half milligrams of amlodipine every day until seen by your PCP.  You have received your dose here tonight.  Please do not take your first dose at home until tomorrow morning.

## 2022-10-27 NOTE — ED Triage Notes (Signed)
Pt BIB [facility] from home (lives with brother) due to hypertension.  Pt home health aide reported her initial was 178/102.  [facility] last BP 154/114.  CBG 210.  Pt states she has been having intermittent headaches as well.  Pt does have MS and is bed bound (unable to use legs).

## 2022-12-02 LAB — COLOGUARD: COLOGUARD: NEGATIVE

## 2022-12-29 ENCOUNTER — Encounter (HOSPITAL_COMMUNITY): Payer: Self-pay

## 2022-12-29 ENCOUNTER — Emergency Department (HOSPITAL_COMMUNITY): Payer: 59

## 2022-12-29 ENCOUNTER — Other Ambulatory Visit: Payer: Self-pay

## 2022-12-29 ENCOUNTER — Inpatient Hospital Stay (HOSPITAL_COMMUNITY)
Admission: EM | Admit: 2022-12-29 | Discharge: 2023-01-01 | DRG: 871 | Disposition: A | Discharge status: Home Health | Payer: 59 | Attending: Internal Medicine | Admitting: Internal Medicine

## 2022-12-29 DIAGNOSIS — I10 Essential (primary) hypertension: Secondary | ICD-10-CM | POA: Diagnosis present

## 2022-12-29 DIAGNOSIS — F0280 Dementia in other diseases classified elsewhere without behavioral disturbance: Secondary | ICD-10-CM | POA: Diagnosis present

## 2022-12-29 DIAGNOSIS — Z90710 Acquired absence of both cervix and uterus: Secondary | ICD-10-CM

## 2022-12-29 DIAGNOSIS — G9340 Encephalopathy, unspecified: Secondary | ICD-10-CM | POA: Diagnosis present

## 2022-12-29 DIAGNOSIS — E669 Obesity, unspecified: Secondary | ICD-10-CM | POA: Diagnosis present

## 2022-12-29 DIAGNOSIS — I499 Cardiac arrhythmia, unspecified: Secondary | ICD-10-CM | POA: Diagnosis not present

## 2022-12-29 DIAGNOSIS — Z885 Allergy status to narcotic agent status: Secondary | ICD-10-CM | POA: Diagnosis not present

## 2022-12-29 DIAGNOSIS — K8020 Calculus of gallbladder without cholecystitis without obstruction: Secondary | ICD-10-CM | POA: Diagnosis not present

## 2022-12-29 DIAGNOSIS — E1100 Type 2 diabetes mellitus with hyperosmolarity without nonketotic hyperglycemic-hyperosmolar coma (NKHHC): Secondary | ICD-10-CM | POA: Diagnosis not present

## 2022-12-29 DIAGNOSIS — N390 Urinary tract infection, site not specified: Secondary | ICD-10-CM | POA: Diagnosis present

## 2022-12-29 DIAGNOSIS — G35 Multiple sclerosis: Secondary | ICD-10-CM | POA: Diagnosis not present

## 2022-12-29 DIAGNOSIS — Z87891 Personal history of nicotine dependence: Secondary | ICD-10-CM

## 2022-12-29 DIAGNOSIS — G300 Alzheimer's disease with early onset: Secondary | ICD-10-CM | POA: Diagnosis not present

## 2022-12-29 DIAGNOSIS — E860 Dehydration: Secondary | ICD-10-CM | POA: Diagnosis present

## 2022-12-29 DIAGNOSIS — Z90722 Acquired absence of ovaries, bilateral: Secondary | ICD-10-CM | POA: Diagnosis not present

## 2022-12-29 DIAGNOSIS — Z79899 Other long term (current) drug therapy: Secondary | ICD-10-CM | POA: Diagnosis not present

## 2022-12-29 DIAGNOSIS — R0602 Shortness of breath: Secondary | ICD-10-CM | POA: Diagnosis not present

## 2022-12-29 DIAGNOSIS — G40909 Epilepsy, unspecified, not intractable, without status epilepticus: Secondary | ICD-10-CM | POA: Diagnosis present

## 2022-12-29 DIAGNOSIS — F32A Depression, unspecified: Secondary | ICD-10-CM | POA: Diagnosis present

## 2022-12-29 DIAGNOSIS — Z6832 Body mass index (BMI) 32.0-32.9, adult: Secondary | ICD-10-CM | POA: Diagnosis not present

## 2022-12-29 DIAGNOSIS — Z743 Need for continuous supervision: Secondary | ICD-10-CM | POA: Diagnosis not present

## 2022-12-29 DIAGNOSIS — F3342 Major depressive disorder, recurrent, in full remission: Secondary | ICD-10-CM | POA: Diagnosis present

## 2022-12-29 DIAGNOSIS — E7800 Pure hypercholesterolemia, unspecified: Secondary | ICD-10-CM | POA: Diagnosis not present

## 2022-12-29 DIAGNOSIS — A419 Sepsis, unspecified organism: Principal | ICD-10-CM | POA: Diagnosis present

## 2022-12-29 DIAGNOSIS — R569 Unspecified convulsions: Secondary | ICD-10-CM | POA: Diagnosis not present

## 2022-12-29 DIAGNOSIS — G9341 Metabolic encephalopathy: Secondary | ICD-10-CM | POA: Diagnosis not present

## 2022-12-29 DIAGNOSIS — R000 Tachycardia, unspecified: Secondary | ICD-10-CM | POA: Diagnosis not present

## 2022-12-29 DIAGNOSIS — R739 Hyperglycemia, unspecified: Secondary | ICD-10-CM | POA: Diagnosis not present

## 2022-12-29 DIAGNOSIS — J9811 Atelectasis: Secondary | ICD-10-CM | POA: Diagnosis not present

## 2022-12-29 DIAGNOSIS — K760 Fatty (change of) liver, not elsewhere classified: Secondary | ICD-10-CM | POA: Diagnosis not present

## 2022-12-29 DIAGNOSIS — R404 Transient alteration of awareness: Secondary | ICD-10-CM | POA: Diagnosis not present

## 2022-12-29 DIAGNOSIS — E1165 Type 2 diabetes mellitus with hyperglycemia: Secondary | ICD-10-CM | POA: Diagnosis not present

## 2022-12-29 DIAGNOSIS — R410 Disorientation, unspecified: Secondary | ICD-10-CM | POA: Diagnosis not present

## 2022-12-29 LAB — LIPASE, BLOOD: Lipase: 36 U/L (ref 11–51)

## 2022-12-29 LAB — CBC WITH DIFFERENTIAL/PLATELET
Abs Immature Granulocytes: 0.07 10*3/uL (ref 0.00–0.07)
Basophils Absolute: 0.1 10*3/uL (ref 0.0–0.1)
Basophils Relative: 1 %
Eosinophils Absolute: 0.1 10*3/uL (ref 0.0–0.5)
Eosinophils Relative: 1 %
HCT: 57.4 % — ABNORMAL HIGH (ref 36.0–46.0)
Hemoglobin: 18.7 g/dL — ABNORMAL HIGH (ref 12.0–15.0)
Immature Granulocytes: 1 %
Lymphocytes Relative: 19 %
Lymphs Abs: 1.5 10*3/uL (ref 0.7–4.0)
MCH: 31.4 pg (ref 26.0–34.0)
MCHC: 32.6 g/dL (ref 30.0–36.0)
MCV: 96.3 fL (ref 80.0–100.0)
Monocytes Absolute: 0.7 10*3/uL (ref 0.1–1.0)
Monocytes Relative: 9 %
Neutro Abs: 5.8 10*3/uL (ref 1.7–7.7)
Neutrophils Relative %: 69 %
Platelets: 274 10*3/uL (ref 150–400)
RBC: 5.96 MIL/uL — ABNORMAL HIGH (ref 3.87–5.11)
RDW: 14.6 % (ref 11.5–15.5)
WBC: 8.2 10*3/uL (ref 4.0–10.5)
nRBC: 0 % (ref 0.0–0.2)

## 2022-12-29 LAB — URINALYSIS, W/ REFLEX TO CULTURE (INFECTION SUSPECTED)
Bilirubin Urine: NEGATIVE
Glucose, UA: >=500 mg/dL — AB
Hgb urine dipstick: NEGATIVE
Ketones, ur: 5 mg/dL — AB
Nitrite: POSITIVE — AB
Protein, ur: NEGATIVE mg/dL
Specific Gravity, Urine: 1.039 — ABNORMAL HIGH (ref 1.005–1.030)
pH: 5 (ref 5.0–8.0)

## 2022-12-29 LAB — OSMOLALITY: Osmolality: 348 mOsm/kg (ref 275–295)

## 2022-12-29 LAB — PROTIME-INR
INR: 1 (ref 0.8–1.2)
Prothrombin Time: 13.7 seconds (ref 11.4–15.2)

## 2022-12-29 LAB — I-STAT CHEM 8, ED
BUN: 14 mg/dL (ref 6–20)
Calcium, Ion: 1.26 mmol/L (ref 1.15–1.40)
Chloride: 108 mmol/L (ref 98–111)
Creatinine, Ser: 0.6 mg/dL (ref 0.44–1.00)
Glucose, Bld: 639 mg/dL (ref 70–99)
HCT: 57 % — ABNORMAL HIGH (ref 36.0–46.0)
Hemoglobin: 19.4 g/dL — ABNORMAL HIGH (ref 12.0–15.0)
Potassium: 4.1 mmol/L (ref 3.5–5.1)
Sodium: 147 mmol/L — ABNORMAL HIGH (ref 135–145)
TCO2: 30 mmol/L (ref 22–32)

## 2022-12-29 LAB — CBG MONITORING, ED
Glucose-Capillary: 448 mg/dL — ABNORMAL HIGH (ref 70–99)
Glucose-Capillary: 379 mg/dL — ABNORMAL HIGH (ref 70–99)

## 2022-12-29 LAB — COMPREHENSIVE METABOLIC PANEL
ALT: 65 U/L — ABNORMAL HIGH (ref 0–44)
AST: 37 U/L (ref 15–41)
Albumin: 4 g/dL (ref 3.5–5.0)
Alkaline Phosphatase: 148 U/L — ABNORMAL HIGH (ref 38–126)
Anion gap: 16 — ABNORMAL HIGH (ref 5–15)
BUN: 14 mg/dL (ref 6–20)
CO2: 24 mmol/L (ref 22–32)
Calcium: 10.5 mg/dL — ABNORMAL HIGH (ref 8.9–10.3)
Chloride: 105 mmol/L (ref 98–111)
Creatinine, Ser: 0.75 mg/dL (ref 0.44–1.00)
GFR, Estimated: >60 mL/min (ref >60)
Glucose, Bld: 612 mg/dL (ref 70–99)
Potassium: 4.1 mmol/L (ref 3.5–5.1)
Sodium: 145 mmol/L (ref 135–145)
Total Bilirubin: 1.2 mg/dL (ref 0.3–1.2)
Total Protein: 7.1 g/dL (ref 6.5–8.1)

## 2022-12-29 LAB — I-STAT VENOUS BLOOD GAS, ED
Acid-Base Excess: 4 mmol/L — ABNORMAL HIGH (ref 0.0–2.0)
Bicarbonate: 28 mmol/L (ref 20.0–28.0)
Calcium, Ion: 1.2 mmol/L (ref 1.15–1.40)
HCT: 56 % — ABNORMAL HIGH (ref 36.0–46.0)
Hemoglobin: 19 g/dL — ABNORMAL HIGH (ref 12.0–15.0)
O2 Saturation: 95 %
Potassium: 4.1 mmol/L (ref 3.5–5.1)
Sodium: 146 mmol/L — ABNORMAL HIGH (ref 135–145)
TCO2: 29 mmol/L (ref 22–32)
pCO2, Ven: 39.2 mmHg — ABNORMAL LOW (ref 44–60)
pH, Ven: 7.462 — ABNORMAL HIGH (ref 7.25–7.43)
pO2, Ven: 72 mmHg — ABNORMAL HIGH (ref 32–45)

## 2022-12-29 LAB — LACTIC ACID, PLASMA
Lactic Acid, Venous: 3.2 mmol/L (ref 0.5–1.9)
Lactic Acid, Venous: 4 mmol/L (ref 0.5–1.9)

## 2022-12-29 MED ORDER — ACETAMINOPHEN 325 MG PO TABS: 650.0000 mg | ORAL_TABLET | Freq: Once | ORAL | Status: DC

## 2022-12-29 MED ORDER — FLUOXETINE HCL 20 MG PO CAPS
20.0000 mg | ORAL_CAPSULE | Freq: Every day | ORAL | Status: DC
  Administered 2022-12-30 – 2023-01-01 (×3): 20 mg via ORAL
  Filled 2022-12-29 (×3): qty 1

## 2022-12-29 MED ORDER — VANCOMYCIN HCL 1750 MG/350ML IV SOLN
1750.0000 mg | Freq: Once | INTRAVENOUS | Status: AC
  Administered 2022-12-29 – 2022-12-30 (×2): 1750 mg via INTRAVENOUS
  Filled 2022-12-29: qty 350

## 2022-12-29 MED ORDER — DEXTROSE 50 % IV SOLN: 0.0000 mL | INTRAVENOUS | Status: DC | PRN

## 2022-12-29 MED ORDER — AMLODIPINE BESYLATE 5 MG PO TABS
2.5000 mg | ORAL_TABLET | Freq: Every day | ORAL | Status: DC
  Administered 2022-12-30: 2.5 mg via ORAL
  Filled 2022-12-29: qty 1

## 2022-12-29 MED ORDER — LACTATED RINGERS IV BOLUS
1000.0000 mL | Freq: Once | INTRAVENOUS | Status: AC
  Administered 2022-12-29: 1000 mL via INTRAVENOUS

## 2022-12-29 MED ORDER — VANCOMYCIN HCL IN DEXTROSE 1-5 GM/200ML-% IV SOLN: 1000.0000 mg | Freq: Once | INTRAVENOUS | Status: DC

## 2022-12-29 MED ORDER — POTASSIUM CHLORIDE 10 MEQ/100ML IV SOLN
10.0000 meq | INTRAVENOUS | Status: AC
  Administered 2022-12-29 – 2022-12-30 (×3): 10 meq via INTRAVENOUS
  Filled 2022-12-29 (×2): qty 100

## 2022-12-29 MED ORDER — ENOXAPARIN SODIUM 40 MG/0.4ML IJ SOSY
40.0000 mg | PREFILLED_SYRINGE | INTRAMUSCULAR | Status: DC
  Administered 2022-12-30 – 2023-01-01 (×3): 40 mg via SUBCUTANEOUS
  Filled 2022-12-29 (×3): qty 0.4

## 2022-12-29 MED ORDER — SODIUM CHLORIDE 0.9 % IV SOLN
2.0000 g | INTRAVENOUS | Status: DC
  Administered 2022-12-30 – 2023-01-01 (×3): 2 g via INTRAVENOUS
  Filled 2022-12-29 (×3): qty 20

## 2022-12-29 MED ORDER — ACETAMINOPHEN 650 MG RE SUPP
650.0000 mg | Freq: Once | RECTAL | Status: AC
  Administered 2022-12-29: 650 mg via RECTAL
  Filled 2022-12-29: qty 1

## 2022-12-29 MED ORDER — SIMVASTATIN 20 MG PO TABS
20.0000 mg | ORAL_TABLET | Freq: Every evening | ORAL | Status: DC
  Administered 2022-12-30 – 2022-12-31 (×2): 20 mg via ORAL
  Filled 2022-12-29 (×2): qty 1

## 2022-12-29 MED ORDER — DEXTROSE IN LACTATED RINGERS 5 % IV SOLN: INTRAVENOUS | Status: DC

## 2022-12-29 MED ORDER — METRONIDAZOLE 500 MG/100ML IV SOLN
500.0000 mg | Freq: Once | INTRAVENOUS | Status: DC
  Filled 2022-12-29: qty 100

## 2022-12-29 MED ORDER — LACTATED RINGERS IV SOLN: INTRAVENOUS | Status: DC

## 2022-12-29 MED ORDER — GABAPENTIN 300 MG PO CAPS
300.0000 mg | ORAL_CAPSULE | Freq: Three times a day (TID) | ORAL | Status: DC
  Administered 2022-12-30 – 2023-01-01 (×7): 300 mg via ORAL
  Filled 2022-12-29 (×7): qty 1

## 2022-12-29 MED ORDER — SODIUM CHLORIDE 0.9 % IV SOLN: 2.0000 g | Freq: Once | INTRAVENOUS | Status: DC

## 2022-12-29 MED ORDER — INSULIN REGULAR(HUMAN) IN NACL 100-0.9 UT/100ML-% IV SOLN
INTRAVENOUS | Status: DC
  Administered 2022-12-29: 7.5 [IU]/h via INTRAVENOUS
  Administered 2022-12-29: 3.4 [IU]/h via INTRAVENOUS
  Administered 2022-12-30: 4 [IU]/h via INTRAVENOUS
  Filled 2022-12-29: qty 100

## 2022-12-29 MED ORDER — PANTOPRAZOLE SODIUM 40 MG PO TBEC
40.0000 mg | DELAYED_RELEASE_TABLET | Freq: Every day | ORAL | Status: DC
  Administered 2022-12-30 – 2023-01-01 (×3): 40 mg via ORAL
  Filled 2022-12-29 (×3): qty 1

## 2022-12-29 MED ORDER — LACTATED RINGERS IV BOLUS
20.0000 mL/kg | Freq: Once | INTRAVENOUS | Status: AC
  Administered 2022-12-29: 1620 mL via INTRAVENOUS

## 2022-12-29 MED ORDER — IOHEXOL 350 MG/ML SOLN
75.0000 mL | Freq: Once | INTRAVENOUS | Status: AC | PRN
  Administered 2022-12-29: 75 mL via INTRAVENOUS

## 2022-12-29 MED ORDER — SODIUM CHLORIDE 0.9 % IV SOLN
2.0000 g | Freq: Three times a day (TID) | INTRAVENOUS | Status: DC
  Administered 2022-12-29: 2 g via INTRAVENOUS
  Filled 2022-12-29: qty 12.5

## 2022-12-29 MED ORDER — LEVETIRACETAM 500 MG PO TABS: 1000.0000 mg | ORAL_TABLET | Freq: Two times a day (BID) | ORAL | Status: DC

## 2022-12-29 MED ORDER — VANCOMYCIN HCL IN DEXTROSE 1-5 GM/200ML-% IV SOLN: 1000.0000 mg | INTRAVENOUS | Status: DC

## 2022-12-29 NOTE — ED Triage Notes (Signed)
Pt BIB[facility] from home after becoming increasingly more altered over the course of two days. Family reports increased thirst, confusion, and decreased appetite with lethargy. Pt upon arrival was alert and communicating but confused.   Hx MS and baseline bed bound  CBG 567  Tachycardic and HTN for EMS

## 2022-12-29 NOTE — ED Provider Notes (Signed)
[facility] AT [facility] Provider Note   CSN: [id number] Arrival date & time: 12/29/22  1901     History  Chief Complaint  Patient presents with   Altered Mental Status    Brandy Mckinney is a 57 y.o. female.  HPI 57 year old female with history of MS presents with altered mental status.  History is from patient's brother who takes care of her and his [name].  Patient has become progressively more altered over the last 3 days.  Has also had increased urinary frequency and thirst/drinking more fluids over the last 3 days as well.  Today she could not even recognize the brother.  Yesterday she was just more confused and somnolent.  Has been lethargic.  No fevers.  No history of diabetes. No cough, vomiting.  Recently was put on Amlodipine for HTN in April in ED, dose was increased by PCP a few days ago. In 2022 had problems with gallbladder. Was not removed but was told it may need to be if this happens again.  Home Medications Prior to Admission medications   Medication Sig Start Date End Date Taking? Authorizing Provider  amLODipine (NORVASC) 2.5 MG tablet Take 1 tablet (2.5 mg total) by mouth daily. 10/27/22   Al Decant, PA-C  Ascorbic Acid (VITAMIN C) 1000 MG tablet Take 1,000 mg by mouth in the morning.    [provider]  Cholecalciferol (VITAMIN D3) 50 MCG (2000 UT) TABS Take 2,000 Units by mouth daily.    [provider]  Cranberry 500 MG TABS Take 1 tablet by mouth daily.    [provider]  cyclobenzaprine (FLEXERIL) 10 MG tablet Take 10 mg by mouth at bedtime. 04/17/19   [provider]  etodolac (LODINE) 400 MG tablet Take 1 tablet (400 mg total) by mouth 2 (two) times daily as needed. 06/10/22   Sater, Pearletha Furl, MD  FLUoxetine (PROZAC) 20 MG capsule Take 20 mg by mouth in the morning. 12/16/17   [provider]  gabapentin (NEURONTIN) 300 MG capsule Take 1 capsule (300 mg total) by mouth 3  (three) times daily. 06/10/22   Sater, Pearletha Furl, MD  Inulin (FIBER CHOICE PO) Take 1 tablet by mouth in the morning.    [provider]  levETIRAcetam (KEPPRA) 1000 MG tablet TAKE 1 TABLET BY MOUTH TWICE  DAILY 09/13/22   Sater, Pearletha Furl, MD  Multiple Vitamin (MULTIVITAMIN WITH MINERALS) TABS tablet Take 1 tablet by mouth in the morning.    [provider]  omeprazole (PRILOSEC) 20 MG capsule Take 20 mg by mouth daily before breakfast. 01/27/21   [provider]  Polyethyl Glycol-Propyl Glycol (LUBRICANT EYE DROPS) 0.4-0.3 % SOLN Place 1-2 drops into both eyes 3 (three) times daily as needed (dry/irritated eyes.).    [provider]  simvastatin (ZOCOR) 20 MG tablet Take 20 mg by mouth every evening. 05/09/13   [provider]  triamcinolone cream (KENALOG) 0.1 % Apply 1 Application topically daily. After bathing    [provider]      Allergies    Fentanyl    Review of Systems   Review of Systems  Unable to perform ROS: Mental status change  Constitutional:  Negative for fever.  Endocrine: Positive for polydipsia.  Genitourinary:  Positive for frequency.    Physical Exam Updated Vital Signs BP (!) 144/108   Pulse (!) 118   Temp 97.8 F (36.6 C) (Oral)   Resp 12   Wt 81  kg   SpO2 96%   BMI 32.66 kg/m  Physical Exam Vitals and nursing note reviewed.  Constitutional:      Appearance: She is well-developed. She is ill-appearing.  HENT:     Head: Normocephalic and atraumatic.     Mouth/Throat:     Mouth: Mucous membranes are dry.  Eyes:     Pupils: Pupils are equal, round, and reactive to light.  Cardiovascular:     Rate and Rhythm: Regular rhythm. Tachycardia present.     Heart sounds: Normal heart sounds.  Pulmonary:     Effort: Pulmonary effort is normal.     Breath sounds: Normal breath sounds.  Abdominal:     Palpations: Abdomen is soft.     Tenderness: There is abdominal tenderness (primarily right sided.).   Skin:    General: Skin is warm and dry.  Neurological:     Comments: Patient is asleep on arrival, but wakes up to light touch. Patient is awake, alert, disoriented. Equal strength in both upper extremities. 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     ED Results / Procedures / Treatments   Labs (all labs ordered are listed, but only abnormal results are displayed) Labs Reviewed  COMPREHENSIVE METABOLIC PANEL - Abnormal; Notable for the following components:      Result Value   Glucose, Bld 612 (*)    Calcium 10.5 (*)    ALT 65 (*)    Alkaline Phosphatase 148 (*)    Anion gap 16 (*)    All other components within normal limits  LACTIC ACID, PLASMA - Abnormal; Notable for the following components:   Lactic Acid, Venous 3.2 (*)    All other components within normal limits  LACTIC ACID, PLASMA - Abnormal; Notable for the following components:   Lactic Acid, Venous 4.0 (*)    All other components within normal limits  CBC WITH DIFFERENTIAL/PLATELET - Abnormal; Notable for the following components:   RBC 5.96 (*)    Hemoglobin 18.7 (*)    HCT 57.4 (*)    All other components within normal limits  URINALYSIS, W/ REFLEX TO CULTURE (INFECTION SUSPECTED) - Abnormal; Notable for the following components:   APPearance CLOUDY (*)    Specific Gravity, Urine 1.039 (*)    Glucose, UA >=500 (*)    Ketones, ur 5 (*)    Nitrite POSITIVE (*)    Leukocytes,Ua TRACE (*)    Bacteria, UA MANY (*)    Non Squamous Epithelial 0-5 (*)    All other components within normal limits  OSMOLALITY - Abnormal; Notable for the following components:   Osmolality 348 (*)    All other components within normal limits  I-STAT CHEM 8, ED - Abnormal; Notable for the following components:   Sodium 147 (*)    Glucose, Bld 639 (*)    Hemoglobin 19.4 (*)    HCT 57.0 (*)    All other components within normal limits  I-STAT VENOUS BLOOD GAS, ED - Abnormal; Notable for the following components:   pH, Ven 7.462  (*)    pCO2, Ven 39.2 (*)    pO2, Ven 72 (*)    Acid-Base Excess 4.0 (*)    Sodium 146 (*)    HCT 56.0 (*)    Hemoglobin 19.0 (*)    All other components within normal limits  CBG MONITORING, ED - Abnormal; Notable for the following components:   Glucose-Capillary 448 (*)    All other components within normal limits  CBG MONITORING,  ED - Abnormal; Notable for the following components:   Glucose-Capillary 379 (*)    All other components within normal limits  CULTURE, BLOOD (ROUTINE X 2)  CULTURE, BLOOD (ROUTINE X 2)  PROTIME-INR  LIPASE, BLOOD    EKG EKG Interpretation  Date/Time:  Wednesday December 29 2022 19:08:19 EDT Ventricular Rate:  129 PR Interval:  143 QRS Duration: 89 QT Interval:  302 QTC Calculation: 443 R Axis:   29 Text Interpretation: Age not entered, assumed to be  57 years old for purpose of ECG interpretation Sinus tachycardia Consider right atrial enlargement Borderline repolarization abnormality rate is faster compared to April 2024 Confirmed by Pricilla Loveless [phone]) on 12/29/2022 7:45:10 PM  Radiology CT ABDOMEN PELVIS W CONTRAST  Result Date: 12/29/2022 CLINICAL DATA:  Sepsis EXAM: CT ABDOMEN AND PELVIS WITH CONTRAST TECHNIQUE: Multidetector CT imaging of the abdomen and pelvis was performed using the standard protocol following bolus administration of intravenous contrast. RADIATION DOSE REDUCTION: This exam was performed according to the departmental dose-optimization program which includes automated exposure control, adjustment of the mA and/or kV according to patient size and/or use of iterative reconstruction technique. CONTRAST:  75mL OMNIPAQUE IOHEXOL 350 MG/ML SOLN COMPARISON:  Chest CT 06/15/2021 FINDINGS: Lower chest: There is atelectasis in the lung bases. Hepatobiliary: The liver is enlarged and there is diffuse fatty infiltration. Gallstones are present. No biliary ductal dilatation identified. Pancreas: Unremarkable. No pancreatic ductal dilatation or  surrounding inflammatory changes. Spleen: Normal in size without focal abnormality. Adrenals/Urinary Tract: There is a heterogeneous nodule in the left adrenal gland measuring 2.2 x 3.4 by 1.9 cm containing some focal areas of macroscopic fat. The right adrenal gland and bilateral kidneys are within normal limits. The bladder is within normal limits. Stomach/Bowel: Stomach is within normal limits. Appendix appears normal. No evidence of bowel wall thickening, distention, or inflammatory changes. There is sigmoid colon diverticulosis. There is a large amount of stool throughout the colon. The rectum is dilated and stool-filled measuring 8.5 cm in transverse dimension. Vascular/Lymphatic: Aortic atherosclerosis. No enlarged abdominal or pelvic lymph nodes. Reproductive: Status post hysterectomy. No adnexal masses. Other: No abdominal wall hernia or abnormality. No abdominopelvic ascites. Musculoskeletal: No acute or significant osseous findings. IMPRESSION: 1. No acute localizing process in the abdomen or pelvis. 2. Large amount of stool throughout the colon. The rectum is dilated and stool-filled measuring 8.5 cm in transverse dimension. 3. Colonic diverticulosis without evidence for diverticulitis. 4. Cholelithiasis. 5. Fatty infiltration of the liver with hepatomegaly. 6. 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 Aortic Atherosclerosis (ICD10-I70.0). Electronically Signed   By: Darliss Cheney M.D.   On: 12/29/2022 22:38   CT Head Wo Contrast  Result Date: 12/29/2022 CLINICAL DATA:  Mental status change, unknown cause EXAM: CT HEAD WITHOUT CONTRAST TECHNIQUE: Contiguous axial images were obtained from the base of the skull through the vertex without intravenous contrast. RADIATION DOSE REDUCTION: This exam was performed according to the departmental dose-optimization program which includes automated exposure control, adjustment of the mA and/or kV according to  patient size and/or use of iterative reconstruction technique. COMPARISON:  10/27/2022 FINDINGS: Brain: Severe diffuse atrophy, most advanced in the frontal lobes. Advanced chronic small vessel disease throughout the deep white matter. No acute intracranial abnormality. Specifically, no hemorrhage, hydrocephalus, mass lesion, acute infarction, or significant intracranial injury. Vascular: No hyperdense vessel or unexpected calcification. Skull: No acute calvarial abnormality. Sinuses/Orbits: No acute findings Other: None IMPRESSION: Severe atrophy, chronic small vessel disease,  stable. No acute intracranial abnormality. Electronically Signed   By: Charlett Nose M.D.   On: 12/29/2022 22:35   DG Chest Portable 1 View  Result Date: 12/29/2022 CLINICAL DATA:  Altered mental status EXAM: PORTABLE CHEST 1 VIEW COMPARISON:  Chest radiograph and CT 06/15/2021 FINDINGS: Stable cardiomediastinal silhouette given patient rotation. Left basilar atelectasis. Otherwise no focal consolidation. No pleural effusion or pneumothorax. No displaced rib fractures. IMPRESSION: No active disease. Electronically Signed   By: Minerva Fester M.D.   On: 12/29/2022 19:50    Procedures .Critical Care  Performed by: Pricilla Loveless, MD Authorized by: Pricilla Loveless, MD   Critical care provider statement:    Critical care time (minutes):  45   Critical care time was exclusive of:  Separately billable procedures and treating other patients   Critical care was necessary to treat or prevent imminent or life-threatening deterioration of the following conditions:  CNS failure or compromise, dehydration, endocrine crisis and sepsis   Critical care was time spent personally by me on the following activities:  Development of treatment plan with patient or surrogate, discussions with consultants, evaluation of patient's response to treatment, examination of patient, ordering and review of laboratory studies, ordering and review of  radiographic studies, ordering and performing treatments and interventions, pulse oximetry, re-evaluation of patient's condition and review of old charts     Medications Ordered in ED Medications  metroNIDAZOLE (FLAGYL) IVPB 500 mg (has no administration in time range)  ceFEPIme (MAXIPIME) 2 g in sodium chloride 0.9 % 100 mL IVPB (0 g Intravenous Stopped 12/29/22 2211)  vancomycin (VANCOREADY) IVPB 1750 mg/350 mL (1,750 mg Intravenous New Bag/Given 12/29/22 2220)  vancomycin (VANCOCIN) IVPB 1000 mg/200 mL premix (has no administration in time range)  insulin regular, human (MYXREDLIN) 100 units/ 100 mL infusion (5.5 Units/hr Intravenous Rate/Dose Change 12/29/22 2242)  lactated ringers infusion (has no administration in time range)  dextrose 5 % in lactated ringers infusion (has no administration in time range)  dextrose 50 % solution 0-50 mL (has no administration in time range)  potassium chloride 10 mEq in 100 mL IVPB (10 mEq Intravenous New Bag/Given 12/29/22 2137)  acetaminophen (TYLENOL) suppository 650 mg (has no administration in time range)  lactated ringers bolus 1,000 mL (0 mLs Intravenous Stopped 12/29/22 2216)  lactated ringers bolus 1,000 mL (0 mLs Intravenous Stopped 12/29/22 2216)  lactated ringers bolus 1,620 mL (1,620 mLs Intravenous New Bag/Given 12/29/22 2238)  iohexol (OMNIPAQUE) 350 MG/ML injection 75 mL (75 mLs Intravenous Contrast Given 12/29/22 2227)    ED Course/ Medical Decision Making/ A&P Clinical Course as of 12/29/22 2243  Wed Dec 29, 2022  2012 Temp 101. Will active code sepsis. Will give broad antibiotics, tylenol [SG]  2105 Osmolality 348. C/w HHS. Will put on insulin drip. [SG]    Clinical Course User Index [SG] Pricilla Loveless, MD                             Medical Decision Making Amount and/or Complexity of Data Reviewed Labs: ordered.    Details: Hyperglycemia and hyperosmolarity, as well as a UTI.  Lactate 3.2. Radiology: ordered and independent  interpretation performed.    Details: No head bleed on head CT.  No bowel obstruction or cholecystitis. ECG/medicine tests: ordered and independent interpretation performed.    Details: Sinus tachycardia  Risk OTC drugs. Prescription drug management. Decision regarding hospitalization.   She presents with altered mental status.  Mental status  seems to be improving, unclear if this is from hydration and glucose control versus Tylenol for fever, versus both.  Appears to have an acute urinary tract infection and some significant dehydration from HHS.  Started on 2 L of fluids and given IV antibiotics.  Will likely need further fluids and was put on insulin via HHS protocol.  Otherwise given broad IV antibiotics they can likely be scaled back towards UTI.  She is better and more alert currently.  Still confused and will need admission. Dr. Antionette Char will admit.         Final Clinical Impression(s) / ED Diagnoses Final diagnoses:  Hyperosmolar hyperglycemic state (HHS) (HCC)  Sepsis secondary to UTI [facility])    Rx / DC Orders ED Discharge Orders     None         Pricilla Loveless, MD 12/29/22 2305

## 2022-12-29 NOTE — ED Notes (Signed)
ED TO INPATIENT HANDOFF REPORT  ED Nurse Name and Phone #: Theadora Rama [phone]  O Name/Age/Gender Brandy Mckinney 57 y.o. female Room/Bed: 025C/025C  Code Status   Code Status: Prior  Home/SNF/Other Home Patient oriented to: self Is this baseline? No   Triage Complete: Triage complete  Chief Complaint Sepsis secondary to UTI (HCC) [A41.9, N39.0]  Triage Note Pt BIBGEMS from home after becoming increasingly more altered over the course of two days. Family reports increased thirst, confusion, and decreased appetite with lethargy. Pt upon arrival was alert and communicating but confused.   Hx MS and baseline bed bound  CBG 567  Tachycardic and HTN for EMS   Allergies Allergies  Allergen Reactions   Fentanyl Other (See Comments)     can not have anything on her skin    Level of Care/Admitting Diagnosis ED Disposition     ED Disposition  Admit   Condition  --   Comment  Hospital Area: [facility] [[id number]]  Level of Care: Progressive [102]  Admit to Progressive based on following criteria: GI, ENDOCRINE disease patients with GI bleeding, acute liver failure or pancreatitis, stable with diabetic ketoacidosis or thyrotoxicosis (hypothyroid) state.  May admit patient to [facility] or [facility] if equivalent level of care is available:: Yes  Covid Evaluation: Asymptomatic - no recent exposure (last 10 days) testing not required  Diagnosis: Sepsis secondary to UTI [facility]) [[id number]]  Admitting Physician: Briscoe Deutscher [[id number]]  Attending Physician: Briscoe Deutscher [[id number]]  Certification:: I certify this patient will need inpatient services for at least 2 midnights  Estimated Length of Stay: 4          B Medical/Surgery History Past Medical History:  Diagnosis Date   Early onset Alzheimer's dementia (HCC)    Hypercholesteremia    Hyperlipidemia    Hypertension    Lumbosacral pain    Multiple sclerosis (HCC)    Seizure (HCC)    Vitamin D  deficiency    Past Surgical History:  Procedure Laterality Date   ABDOMINAL HYSTERECTOMY     CESAREAN SECTION     OOPHORECTOMY       A IV Location/Drains/Wounds Patient Lines/Drains/Airways Status     Active Line/Drains/Airways     Name Placement date Placement time Site Days   Peripheral IV 12/29/22 18 G 1" Posterior;Right Hand 12/29/22  1902  Hand  less than 1   Peripheral IV 12/29/22 20 G 1.88" Left;Anterior Forearm 12/29/22  2059  Forearm  less than 1            Intake/Output Last 24 hours  Intake/Output Summary (Last 24 hours) at 12/29/2022 2327 Last data filed at 12/29/2022 2237 Gross per 24 hour  Intake 1170.82 ml  Output --  Net 1170.82 ml    Labs/Imaging Results for orders placed or performed during the hospital encounter of 12/29/22 (from the past 48 hour(s))  Comprehensive metabolic panel     Status: Abnormal   Collection Time: 12/29/22  7:01 PM  Result Value Ref Range   Sodium 145 135 - 145 mmol/L   Potassium 4.1 3.5 - 5.1 mmol/L   Chloride 105 98 - 111 mmol/L   CO2 24 22 - 32 mmol/L   Glucose, Bld 612 (HH) 70 - 99 mg/dL    Comment: CRITICAL RESULT CALLED TO, READ BACK BY AND VERIFIED WITH Shandreka Dante, P. RN @ 1945 12/29/22 JBUTLER Glucose reference range applies only to samples taken after fasting for at least 8 hours.  BUN 14 6 - 20 mg/dL   Creatinine, Ser 1.61 0.44 - 1.00 mg/dL   Calcium 09.6 (H) 8.9 - 10.3 mg/dL   Total Protein 7.1 6.5 - 8.1 g/dL   Albumin 4.0 3.5 - 5.0 g/dL   AST 37 15 - 41 U/L   ALT 65 (H) 0 - 44 U/L   Alkaline Phosphatase 148 (H) 38 - 126 U/L   Total Bilirubin 1.2 0.3 - 1.2 mg/dL   GFR, Estimated >04 >54 mL/min    Comment: (NOTE) Calculated using the CKD-EPI Creatinine Equation (2021)    Anion gap 16 (H) 5 - 15    Comment: Performed at [facility], [address], [place], [place] [address]  Lactic acid, plasma     Status: Abnormal   Collection Time: 12/29/22  7:01 PM  Result Value Ref Range   Lactic Acid, Venous 3.2  (HH) 0.5 - 1.9 mmol/L    Comment: CRITICAL RESULT CALLED TO, READ BACK BY AND VERIFIED WITH Vito Backers RN @ 1945 12/29/22 JBUTLER Performed at [facility], [address], [place], [place] [address]   CBC with Differential     Status: Abnormal   Collection Time: 12/29/22  7:01 PM  Result Value Ref Range   WBC 8.2 4.0 - 10.5 K/uL   RBC 5.96 (H) 3.87 - 5.11 MIL/uL   Hemoglobin 18.7 (H) 12.0 - 15.0 g/dL   HCT 29.5 (H) 62.1 - 30.8 %   MCV 96.3 80.0 - 100.0 fL   MCH 31.4 26.0 - 34.0 pg   MCHC 32.6 30.0 - 36.0 g/dL   RDW 65.7 84.6 - 96.2 %   Platelets 274 150 - 400 K/uL   nRBC 0.0 0.0 - 0.2 %   Neutrophils Relative % 69 %   Neutro Abs 5.8 1.7 - 7.7 K/uL   Lymphocytes Relative 19 %   Lymphs Abs 1.5 0.7 - 4.0 K/uL   Monocytes Relative 9 %   Monocytes Absolute 0.7 0.1 - 1.0 K/uL   Eosinophils Relative 1 %   Eosinophils Absolute 0.1 0.0 - 0.5 K/uL   Basophils Relative 1 %   Basophils Absolute 0.1 0.0 - 0.1 K/uL   Immature Granulocytes 1 %   Abs Immature Granulocytes 0.07 0.00 - 0.07 K/uL    Comment: Performed at [facility], [address] [place], [place] [address]  Protime-INR     Status: None   Collection Time: 12/29/22  7:01 PM  Result Value Ref Range   Prothrombin Time 13.7 11.4 - 15.2 seconds   INR 1.0 0.8 - 1.2    Comment: (NOTE) INR goal varies based on device and disease states. Performed at [facility], [address] [place], [place] [address]   Lipase, blood     Status: None   Collection Time: 12/29/22  7:01 PM  Result Value Ref Range   Lipase 36 11 - 51 U/L    Comment: Performed at [facility], [address], [place], [place] [address]  Osmolality     Status: Abnormal   Collection Time: 12/29/22  7:01 PM  Result Value Ref Range   Osmolality 348 (HH) 275 - 295 mOsm/kg    Comment: REPEATED TO VERIFY CRITICAL RESULT CALLED TO, READ BACK BY AND VERIFIED WITH: Joan Mayans 2016 12/29/2022 WBOND Performed at [facility], [address] [place], [place] [address]   I-stat chem 8, ED (not at [facility] or [facility])  Status: Abnormal   Collection Time: 12/29/22  7:11 PM  Result Value Ref Range   Sodium 147 (H) 135 - 145 mmol/L   Potassium 4.1 3.5 - 5.1 mmol/L   Chloride 108 98 - 111 mmol/L   BUN 14 6 - 20 mg/dL   Creatinine, Ser 2.13 0.44 - 1.00 mg/dL   Glucose, Bld 086 (HH) 70 - 99 mg/dL    Comment: Glucose reference range applies only to samples taken after fasting for at least 8 hours.   Calcium, Ion 1.26 1.15 - 1.40 mmol/L   TCO2 30 22 - 32 mmol/L   Hemoglobin 19.4 (H) 12.0 - 15.0 g/dL   HCT 57.8 (H) 46.9 - 62.9 %   Comment NOTIFIED PHYSICIAN   I-Stat venous blood gas, ED     Status: Abnormal   Collection Time: 12/29/22  7:19 PM  Result Value Ref Range   pH, Ven 7.462 (H) 7.25 - 7.43   pCO2, Ven 39.2 (L) 44 - 60 mmHg   pO2, Ven 72 (H) 32 - 45 mmHg   Bicarbonate 28.0 20.0 - 28.0 mmol/L   TCO2 29 22 - 32 mmol/L   O2 Saturation 95 %   Acid-Base Excess 4.0 (H) 0.0 - 2.0 mmol/L   Sodium 146 (H) 135 - 145 mmol/L   Potassium 4.1 3.5 - 5.1 mmol/L   Calcium, Ion 1.20 1.15 - 1.40 mmol/L   HCT 56.0 (H) 36.0 - 46.0 %   Hemoglobin 19.0 (H) 12.0 - 15.0 g/dL   Sample type VENOUS   Lactic acid, plasma     Status: Abnormal   Collection Time: 12/29/22  9:00 PM  Result Value Ref Range   Lactic Acid, Venous 4.0 (HH) 0.5 - 1.9 mmol/L    Comment: CRITICAL VALUE NOTED. VALUE IS CONSISTENT WITH PREVIOUSLY REPORTED/CALLED VALUE Performed at [facility], [address], [place], [place] [address]   Urinalysis, w/ Reflex to Culture (Infection Suspected) -Urine, Catheterized     Status: Abnormal   Collection Time: 12/29/22  9:00 PM  Result Value Ref Range   Specimen Source URINE, CATHETERIZED    Color, Urine YELLOW YELLOW   APPearance CLOUDY (A) CLEAR   Specific Gravity, Urine 1.039 (H) 1.005 - 1.030   pH 5.0 5.0 - 8.0   Glucose, UA >=500 (A) NEGATIVE mg/dL   Hgb urine dipstick NEGATIVE NEGATIVE   Bilirubin Urine NEGATIVE  NEGATIVE   Ketones, ur 5 (A) NEGATIVE mg/dL   Protein, ur NEGATIVE NEGATIVE mg/dL   Nitrite POSITIVE (A) NEGATIVE   Leukocytes,Ua TRACE (A) NEGATIVE   RBC / HPF 11-20 0 - 5 RBC/hpf   WBC, UA 6-10 0 - 5 WBC/hpf    Comment:        Reflex urine culture not performed if WBC <=10, OR if Squamous epithelial cells >5. If Squamous epithelial cells >5 suggest recollection.    Bacteria, UA MANY (A) NONE SEEN   Squamous Epithelial / HPF 0-5 0 - 5 /HPF   Budding Yeast PRESENT    Non Squamous Epithelial 0-5 (A) NONE SEEN    Comment: Performed at [facility], [address], [place], [place] [address]  CBG monitoring, ED     Status: Abnormal   Collection Time: 12/29/22  9:54 PM  Result Value Ref Range   Glucose-Capillary 448 (H) 70 - 99 mg/dL    Comment: Glucose reference range applies only to samples taken after fasting for at least 8 hours.  CBG monitoring, ED  Status: Abnormal   Collection Time: 12/29/22 10:41 PM  Result Value Ref Range   Glucose-Capillary 379 (H) 70 - 99 mg/dL    Comment: Glucose reference range applies only to samples taken after fasting for at least 8 hours.   CT ABDOMEN PELVIS W CONTRAST  Result Date: 12/29/2022 CLINICAL DATA:  Sepsis EXAM: CT ABDOMEN AND PELVIS WITH CONTRAST TECHNIQUE: Multidetector CT imaging of the abdomen and pelvis was performed using the standard protocol following bolus administration of intravenous contrast. RADIATION DOSE REDUCTION: This exam was performed according to the departmental dose-optimization program which includes automated exposure control, adjustment of the mA and/or kV according to patient size and/or use of iterative reconstruction technique. CONTRAST:  75mL OMNIPAQUE IOHEXOL 350 MG/ML SOLN COMPARISON:  Chest CT 06/15/2021 FINDINGS: Lower chest: There is atelectasis in the lung bases. Hepatobiliary: The liver is enlarged and there is diffuse fatty infiltration. Gallstones are present. No biliary ductal dilatation identified.  Pancreas: Unremarkable. No pancreatic ductal dilatation or surrounding inflammatory changes. Spleen: Normal in size without focal abnormality. Adrenals/Urinary Tract: There is a heterogeneous nodule in the left adrenal gland measuring 2.2 x 3.4 by 1.9 cm containing some focal areas of macroscopic fat. The right adrenal gland and bilateral kidneys are within normal limits. The bladder is within normal limits. Stomach/Bowel: Stomach is within normal limits. Appendix appears normal. No evidence of bowel wall thickening, distention, or inflammatory changes. There is sigmoid colon diverticulosis. There is a large amount of stool throughout the colon. The rectum is dilated and stool-filled measuring 8.5 cm in transverse dimension. Vascular/Lymphatic: Aortic atherosclerosis. No enlarged abdominal or pelvic lymph nodes. Reproductive: Status post hysterectomy. No adnexal masses. Other: No abdominal wall hernia or abnormality. No abdominopelvic ascites. Musculoskeletal: No acute or significant osseous findings. IMPRESSION: 1. No acute localizing process in the abdomen or pelvis. 2. Large amount of stool throughout the colon. The rectum is dilated and stool-filled measuring 8.5 cm in transverse dimension. 3. Colonic diverticulosis without evidence for diverticulitis. 4. Cholelithiasis. 5. Fatty infiltration of the liver with hepatomegaly. 6. 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 Aortic Atherosclerosis (ICD10-I70.0). Electronically Signed   By: Darliss Cheney M.D.   On: 12/29/2022 22:38   CT Head Wo Contrast  Result Date: 12/29/2022 CLINICAL DATA:  Mental status change, unknown cause EXAM: CT HEAD WITHOUT CONTRAST TECHNIQUE: Contiguous axial images were obtained from the base of the skull through the vertex without intravenous contrast. RADIATION DOSE REDUCTION: This exam was performed according to the departmental dose-optimization program which includes automated  exposure control, adjustment of the mA and/or kV according to patient size and/or use of iterative reconstruction technique. COMPARISON:  10/27/2022 FINDINGS: Brain: Severe diffuse atrophy, most advanced in the frontal lobes. Advanced chronic small vessel disease throughout the deep white matter. No acute intracranial abnormality. Specifically, no hemorrhage, hydrocephalus, mass lesion, acute infarction, or significant intracranial injury. Vascular: No hyperdense vessel or unexpected calcification. Skull: No acute calvarial abnormality. Sinuses/Orbits: No acute findings Other: None IMPRESSION: Severe atrophy, chronic small vessel disease, stable. No acute intracranial abnormality. Electronically Signed   By: Charlett Nose M.D.   On: 12/29/2022 22:35   DG Chest Portable 1 View  Result Date: 12/29/2022 CLINICAL DATA:  Altered mental status EXAM: PORTABLE CHEST 1 VIEW COMPARISON:  Chest radiograph and CT 06/15/2021 FINDINGS: Stable cardiomediastinal silhouette given patient rotation. Left basilar atelectasis. Otherwise no focal consolidation. No pleural effusion or pneumothorax. No displaced rib fractures. IMPRESSION: No active disease. Electronically Signed  By: Minerva Fester M.D.   On: 12/29/2022 19:50    Pending Labs Unresulted Labs (From admission, onward)     Start     Ordered   12/29/22 1903  Culture, blood (Routine x 2)  BLOOD CULTURE X 2,   R (with STAT occurrences)      12/29/22 1902            Vitals/Pain Today's Vitals   12/29/22 2015 12/29/22 2129 12/29/22 2200 12/29/22 2300  BP:  119/68 (!) 144/108   Pulse:  (!) 119 (!) 118   Resp:  18 12   Temp:  97.8 F (36.6 C)  99.4 F (37.4 C)  TempSrc:  Oral  Rectal  SpO2:  90% 96%   Weight: 81 kg       Isolation Precautions No active isolations  Medications Medications  metroNIDAZOLE (FLAGYL) IVPB 500 mg (has no administration in time range)  ceFEPIme (MAXIPIME) 2 g in sodium chloride 0.9 % 100 mL IVPB (0 g Intravenous Stopped  12/29/22 2211)  vancomycin (VANCOREADY) IVPB 1750 mg/350 mL (1,750 mg Intravenous New Bag/Given 12/29/22 2220)  vancomycin (VANCOCIN) IVPB 1000 mg/200 mL premix (has no administration in time range)  insulin regular, human (MYXREDLIN) 100 units/ 100 mL infusion (5.5 Units/hr Intravenous Rate/Dose Change 12/29/22 2242)  lactated ringers infusion (has no administration in time range)  dextrose 5 % in lactated ringers infusion (has no administration in time range)  dextrose 50 % solution 0-50 mL (has no administration in time range)  potassium chloride 10 mEq in 100 mL IVPB (0 mEq Intravenous Stopped 12/29/22 2237)  acetaminophen (TYLENOL) suppository 650 mg (has no administration in time range)  lactated ringers bolus 1,000 mL (0 mLs Intravenous Stopped 12/29/22 2216)  lactated ringers bolus 1,000 mL (0 mLs Intravenous Stopped 12/29/22 2216)  lactated ringers bolus 1,620 mL (1,620 mLs Intravenous New Bag/Given 12/29/22 2238)  iohexol (OMNIPAQUE) 350 MG/ML injection 75 mL (75 mLs Intravenous Contrast Given 12/29/22 2227)    Mobility non-ambulatory     Focused Assessments Cardiac Assessment Handoff:  Cardiac Rhythm: Sinus tachycardia No results found for: "CKTOTAL", "CKMB", "CKMBINDEX", "TROPONINI" Lab Results  Component Value Date   DDIMER 1.52 (H) 06/15/2021   Does the Patient currently have chest pain?  no  , Neuro Assessment Handoff:  Swallow screen pass? No  Cardiac Rhythm: Sinus tachycardia       Neuro Assessment:   Neuro Checks:      Has TPA been given? No If patient is a Neuro Trauma and patient is going to OR before floor call report to 4N Charge nurse: [phone] or [phone]   R Recommendations: See Admitting Provider Note  Report given to:   Additional Notes:

## 2022-12-29 NOTE — Progress Notes (Signed)
Pharmacy Antibiotic Note  Brandy Mckinney is a 57 y.o. female admitted on 12/29/2022 with sepsis.  Pharmacy has been consulted for cefepime and vancomycin dosing.  Plan: Cefepime 2g Q8H.  Give IV Vancomycin 1750mg  x 1 for loading dose, followed by IV Vancomycin 1000 every 24 hours for eAUC431. Vd: 0.5, Scr rounded to 0.8.  Follow culture data for de-escalation.  Monitor renal function for dose adjustments as indicated.    Weight: 81 kg (178 lb 9.2 oz)  Temp (24hrs), Avg:101.2 F (38.4 C), Min:101.2 F (38.4 C), Max:101.2 F (38.4 C)  Recent Labs  Lab 12/29/22 1901 12/29/22 1911  WBC 8.2  --   CREATININE 0.75 0.60  LATICACIDVEN 3.2*  --     Estimated Creatinine Clearance: 79.3 mL/min (by C-G formula based on SCr of 0.6 mg/dL).    Allergies  Allergen Reactions   Fentanyl Other (See Comments)     can not have anything on her skin    Thank you for allowing pharmacy to be a part of this patient's care.  Estill Batten, PharmD, BCCCP  12/29/2022 8:22 PM

## 2022-12-29 NOTE — Sepsis Progress Note (Signed)
Elink monitoring for the code sepsis protocol.  

## 2022-12-30 ENCOUNTER — Inpatient Hospital Stay (HOSPITAL_COMMUNITY): Payer: 59

## 2022-12-30 DIAGNOSIS — R569 Unspecified convulsions: Secondary | ICD-10-CM | POA: Diagnosis not present

## 2022-12-30 DIAGNOSIS — N390 Urinary tract infection, site not specified: Secondary | ICD-10-CM

## 2022-12-30 DIAGNOSIS — A419 Sepsis, unspecified organism: Secondary | ICD-10-CM

## 2022-12-30 LAB — CBC
HCT: 50.4 % — ABNORMAL HIGH (ref 36.0–46.0)
Hemoglobin: 16.1 g/dL — ABNORMAL HIGH (ref 12.0–15.0)
MCH: 31.1 pg (ref 26.0–34.0)
MCHC: 31.9 g/dL (ref 30.0–36.0)
MCV: 97.3 fL (ref 80.0–100.0)
Platelets: 192 10*3/uL (ref 150–400)
RBC: 5.18 MIL/uL — ABNORMAL HIGH (ref 3.87–5.11)
RDW: 14.5 % (ref 11.5–15.5)
WBC: 7 10*3/uL (ref 4.0–10.5)
nRBC: 0 % (ref 0.0–0.2)

## 2022-12-30 LAB — BASIC METABOLIC PANEL
Anion gap: 11 (ref 5–15)
BUN: 10 mg/dL (ref 6–20)
CO2: 26 mmol/L (ref 22–32)
Calcium: 9 mg/dL (ref 8.9–10.3)
Chloride: 108 mmol/L (ref 98–111)
Creatinine, Ser: 0.56 mg/dL (ref 0.44–1.00)
GFR, Estimated: >60 mL/min (ref >60)
Glucose, Bld: 304 mg/dL — ABNORMAL HIGH (ref 70–99)
Potassium: 3.4 mmol/L — ABNORMAL LOW (ref 3.5–5.1)
Sodium: 145 mmol/L (ref 135–145)

## 2022-12-30 LAB — GLUCOSE, CAPILLARY
Glucose-Capillary: 272 mg/dL — ABNORMAL HIGH (ref 70–99)
Glucose-Capillary: 277 mg/dL — ABNORMAL HIGH (ref 70–99)
Glucose-Capillary: 262 mg/dL — ABNORMAL HIGH (ref 70–99)
Glucose-Capillary: 246 mg/dL — ABNORMAL HIGH (ref 70–99)
Glucose-Capillary: 292 mg/dL — ABNORMAL HIGH (ref 70–99)
Glucose-Capillary: 329 mg/dL — ABNORMAL HIGH (ref 70–99)
Glucose-Capillary: 453 mg/dL — ABNORMAL HIGH (ref 70–99)
Glucose-Capillary: 270 mg/dL — ABNORMAL HIGH (ref 70–99)
Glucose-Capillary: 336 mg/dL — ABNORMAL HIGH (ref 70–99)

## 2022-12-30 LAB — AMMONIA: Ammonia: 38 umol/L — ABNORMAL HIGH (ref 9–35)

## 2022-12-30 LAB — C-REACTIVE PROTEIN: CRP: 0.9 mg/dL (ref <1.0)

## 2022-12-30 LAB — PROCALCITONIN: Procalcitonin: <0.1 ng/mL

## 2022-12-30 LAB — CBG MONITORING, ED
Glucose-Capillary: 255 mg/dL — ABNORMAL HIGH (ref 70–99)
Glucose-Capillary: 260 mg/dL — ABNORMAL HIGH (ref 70–99)

## 2022-12-30 LAB — LACTIC ACID, PLASMA: Lactic Acid, Venous: 1.9 mmol/L (ref 0.5–1.9)

## 2022-12-30 LAB — BRAIN NATRIURETIC PEPTIDE: B Natriuretic Peptide: 10.3 pg/mL (ref 0.0–100.0)

## 2022-12-30 LAB — HEMOGLOBIN A1C
Hgb A1c MFr Bld: 10 % — ABNORMAL HIGH (ref 4.8–5.6)
Mean Plasma Glucose: 240.3 mg/dL

## 2022-12-30 LAB — HIV ANTIBODY (ROUTINE TESTING W REFLEX): HIV Screen 4th Generation wRfx: NONREACTIVE

## 2022-12-30 LAB — MAGNESIUM
Magnesium: 1.8 mg/dL (ref 1.7–2.4)
Magnesium: 1.8 mg/dL (ref 1.7–2.4)

## 2022-12-30 MED ORDER — LACTATED RINGERS IV SOLN: INTRAVENOUS | Status: AC

## 2022-12-30 MED ORDER — LEVETIRACETAM IN NACL 1000 MG/100ML IV SOLN
1000.0000 mg | Freq: Two times a day (BID) | INTRAVENOUS | Status: DC
  Administered 2022-12-30: 1000 mg via INTRAVENOUS
  Filled 2022-12-30: qty 100

## 2022-12-30 MED ORDER — INSULIN ASPART 100 UNIT/ML IJ SOLN
0.0000 [IU] | Freq: Three times a day (TID) | INTRAMUSCULAR | Status: DC
  Administered 2022-12-30: 7 [IU] via SUBCUTANEOUS

## 2022-12-30 MED ORDER — INSULIN GLARGINE-YFGN 100 UNIT/ML ~~LOC~~ SOLN
10.0000 [IU] | Freq: Once | SUBCUTANEOUS | Status: AC
  Administered 2022-12-30: 10 [IU] via SUBCUTANEOUS
  Filled 2022-12-30: qty 0.1

## 2022-12-30 MED ORDER — INSULIN ASPART 100 UNIT/ML IJ SOLN: 0.0000 [IU] | Freq: Every day | INTRAMUSCULAR | Status: DC

## 2022-12-30 MED ORDER — INSULIN ASPART 100 UNIT/ML IJ SOLN
0.0000 [IU] | Freq: Every day | INTRAMUSCULAR | Status: DC
  Administered 2022-12-30: 5 [IU] via SUBCUTANEOUS

## 2022-12-30 MED ORDER — LIVING WELL WITH DIABETES BOOK
Freq: Once | Status: AC
  Filled 2022-12-30: qty 1

## 2022-12-30 MED ORDER — INSULIN GLARGINE-YFGN 100 UNIT/ML ~~LOC~~ SOLN
25.0000 [IU] | Freq: Every day | SUBCUTANEOUS | Status: DC
  Administered 2022-12-30: 25 [IU] via SUBCUTANEOUS
  Filled 2022-12-30: qty 0.25

## 2022-12-30 MED ORDER — INSULIN ASPART 100 UNIT/ML IJ SOLN
4.0000 [IU] | Freq: Three times a day (TID) | INTRAMUSCULAR | Status: DC
  Administered 2022-12-30 (×2): 4 [IU] via SUBCUTANEOUS

## 2022-12-30 MED ORDER — INSULIN ASPART 100 UNIT/ML IJ SOLN
0.0000 [IU] | Freq: Three times a day (TID) | INTRAMUSCULAR | Status: DC
  Administered 2022-12-30: 8 [IU] via SUBCUTANEOUS
  Administered 2022-12-30: 21 [IU] via SUBCUTANEOUS

## 2022-12-30 MED ORDER — INSULIN GLARGINE-YFGN 100 UNIT/ML ~~LOC~~ SOLN
35.0000 [IU] | Freq: Every day | SUBCUTANEOUS | Status: DC
  Filled 2022-12-30: qty 0.35

## 2022-12-30 MED ORDER — POTASSIUM CHLORIDE CRYS ER 20 MEQ PO TBCR
40.0000 meq | EXTENDED_RELEASE_TABLET | Freq: Once | ORAL | Status: AC
  Administered 2022-12-30: 40 meq via ORAL
  Filled 2022-12-30: qty 2

## 2022-12-30 MED ORDER — LEVETIRACETAM 500 MG PO TABS
1000.0000 mg | ORAL_TABLET | Freq: Two times a day (BID) | ORAL | Status: DC
  Administered 2022-12-30 – 2023-01-01 (×5): 1000 mg via ORAL
  Filled 2022-12-30 (×5): qty 2

## 2022-12-30 MED ORDER — INSULIN ASPART 100 UNIT/ML IJ SOLN
3.0000 [IU] | Freq: Three times a day (TID) | INTRAMUSCULAR | Status: DC
  Administered 2022-12-30: 3 [IU] via SUBCUTANEOUS

## 2022-12-30 MED ORDER — ASPIRIN 81 MG PO CHEW
81.0000 mg | CHEWABLE_TABLET | Freq: Every day | ORAL | Status: DC
  Administered 2022-12-30 – 2023-01-01 (×3): 81 mg via ORAL
  Filled 2022-12-30 (×3): qty 1

## 2022-12-30 NOTE — Progress Notes (Signed)
Brandy Mckinney Cancellation Note  Patient Details Name: Brandy Mckinney MRN: [id number] DOB: 10-Jun-1966   Cancelled Treatment:    Reason Eval/Treat Not Completed: Brandy Mckinney screened, no needs identified, will sign off. Per conversation with Brandy Mckinney and her brother, Brandy Mckinney is at her mobility baseline, requiring total assist for all mobility. Brandy Mckinney's brother reports they have all needed DME and are comfortable with care, no current need for Brandy Mckinney at this time, will sign off. Thank you.    Leonie Man 12/30/2022, 11:39 AM

## 2022-12-30 NOTE — Progress Notes (Signed)
OT Cancellation Note - OT Screen  Patient Details Name: Brandy Mckinney MRN: [id number] DOB: [birth year]   Cancelled Treatment:    Reason Eval/Treat Not Completed: OT screened, no needs identified, will sign off (Per pt and her brother, pt is at baseline, requiring Set up to Min assist for self feeding and Max to Total assist for all other ADLs. Brother reports having all needed DME and is comfortable with care, no current need for OT at this time, will sign off.)  Kaidyn Javid "Ronaldo Miyamoto" M., OTR/L, MA Acute Rehab [phone]   Lendon Colonel 12/30/2022, 4:36 PM

## 2022-12-30 NOTE — Progress Notes (Signed)
Notified Dr. Thedore Mins of patient's afternoon blood sugar: 453. Verbal orders: x1 dose of 25u Novolog and switch diet to carb modified. Pt remains stable at this time. WCTM.

## 2022-12-30 NOTE — Progress Notes (Signed)
EEG complete - results pending 

## 2022-12-30 NOTE — Evaluation (Signed)
Clinical/Bedside Swallow Evaluation Patient Details  Name: Brandy Mckinney MRN: [id number] Date of Birth: 1965/09/04  Today's Date: 12/30/2022 Time: SLP Start Time (ACUTE ONLY): 1555 SLP Stop Time (ACUTE ONLY): 1620 SLP Time Calculation (min) (ACUTE ONLY): 25 min  Past Medical History:  Past Medical History:  Diagnosis Date   Early onset Alzheimer's dementia (HCC)    Hypercholesteremia    Hyperlipidemia    Hypertension    Lumbosacral pain    Multiple sclerosis (HCC)    Seizure (HCC)    Vitamin D deficiency    Past Surgical History:  Past Surgical History:  Procedure Laterality Date   ABDOMINAL HYSTERECTOMY     CESAREAN SECTION     OOPHORECTOMY     HPI:  Brandy Mckinney is a 57 y.o. female with medical history significant for hypertension, hyperlipidemia, depression, multiple sclerosis, and Alzheimer dementia, now presenting to the emergency department with confusion, lethargy, loss of appetite, polyuria, and polydipsia.     Patient's brother became concerned due to progressive confusion, lethargy, loss of appetite, polyuria, polydipsia over the past 3 days.  Patient was not expressing any specific complaints;CXR on 12/30/22 negative for acute findings.  ST consulted for swallow evaluation to assess swallow function.    Assessment / Plan / Recommendation  Clinical Impression  Pt seen for clinical swallowing evaluation with impaired mastication noted with solid consistency and slow, prolonged oral manipulation and edentulous state impacting swallow function.  Pt stated she has dentures, but they are not available.  No anterior loss or pharyngeal clearance issues observed during po consumption.  Discussed slow rate and small bite/sip size during meals/snacks, especially when pt is deconditioned as this poses an increased aspiration risk.  Recommend downgrade to Dysphagia 3(mechanical soft)/thin liquid diet for ease of transition/safety precaution.  Medications provided in puree/whole  for easier transit/clearance into pharynx.  ST discussed safe swallow precautions/decreasing environmental distractions during meals to increase safety.  Brother/pt in agreement.  ST will s/o in acute setting.  Thank you for this consult. SLP Visit Diagnosis: Dysphagia, unspecified (R13.10)    Aspiration Risk  Mild aspiration risk    Diet Recommendation   Dysphagia 3/thin liquids (mechanical soft)  Medication Administration: Whole meds with puree    Other  Recommendations Oral Care Recommendations: Oral care BID    Recommendations for follow up therapy are one component of a multi-disciplinary discharge planning process, led by the attending physician.  Recommendations may be updated based on patient status, additional functional criteria and insurance authorization.  Follow up Recommendations No SLP follow up      Assistance Recommended at Discharge  Full  Functional Status Assessment Patient has had a recent decline in their functional status and demonstrates the ability to make significant improvements in function in a reasonable and predictable amount of time.  Frequency and Duration  (evaluation only)          Prognosis Prognosis for improved oropharyngeal function: Good      Swallow Study   General Date of Onset: 12/29/22 HPI: Brandy Mckinney is a 57 y.o. female with medical history significant for hypertension, hyperlipidemia, depression, multiple sclerosis, and Alzheimer dementia, now presenting to the emergency department with confusion, lethargy, loss of appetite, polyuria, and polydipsia.     Patient's brother became concerned due to progressive confusion, lethargy, loss of appetite, polyuria, polydipsia over the past 3 days.  Patient was not expressing any specific complaints;CXR on 12/30/22 negative for acute findings.  ST consulted for swallow evaluation to assess swallow function. Type  of Study: Bedside Swallow Evaluation Previous Swallow Assessment: n/a Diet Prior  to this Study: Regular;Thin liquids (Level 0) Temperature Spikes Noted: No Respiratory Status: Room air History of Recent Intubation: No Behavior/Cognition: Alert;Cooperative;Distractible Oral Cavity Assessment: Within Functional Limits Oral Care Completed by SLP: Other (Comment) (pt consuming dinner tray when SLP entered to complete CSE) Oral Cavity - Dentition: Edentulous Vision: Functional for self-feeding Self-Feeding Abilities: Able to feed self;Needs assist;Needs set up Patient Positioning: Upright in bed Baseline Vocal Quality: Normal Volitional Cough: Strong Volitional Swallow: Able to elicit    Oral/Motor/Sensory Function Overall Oral Motor/Sensory Function: Within functional limits   Ice Chips Ice chips: Not tested   Thin Liquid Thin Liquid: Within functional limits Presentation: Straw    Nectar Thick Nectar Thick Liquid: Not tested   Honey Thick Honey Thick Liquid: Not tested   Puree Puree: Within functional limits Presentation: Spoon   Solid     Solid: Impaired Presentation: Spoon Oral Phase Impairments: Impaired mastication;Reduced lingual movement/coordination Oral Phase Functional Implications: Prolonged oral transit;Impaired mastication      Pat Deshae Dickison,M.S., CCC-SLP 12/30/2022,4:37 PM

## 2022-12-30 NOTE — Procedures (Signed)
Patient Name: Alleya Slowik  MRN: [id number]  Epilepsy Attending: Charlsie Quest  Referring Physician/Provider: Leroy Sea, MD  Date: 12/30/2022 Duration: 23.21 mins  Patient history: 57yo M with ams getting eeg to evaluate for seizure  Level of alertness: Awake  AEDs during EEG study: GBP, LEV  Technical aspects: This EEG study was done with scalp electrodes positioned according to the 10-20 International system of electrode placement. Electrical activity was reviewed with band pass filter of 1-70Hz , sensitivity of 7 uV/mm, display speed of 79mm/sec with a 60Hz  notched filter applied as appropriate. EEG data were recorded continuously and digitally stored.  Video monitoring was available and reviewed as appropriate.  Description: The posterior dominant rhythm consists of 9 Hz activity of moderate voltage (25-35 uV) seen predominantly in posterior head regions, symmetric and reactive to eye opening and eye closing. Hyperventilation and photic stimulation were not performed.     IMPRESSION: This study is within normal limits. No seizures or epileptiform discharges were seen throughout the recording.  A normal interictal EEG does not exclude the diagnosis of epilepsy.   Kenyatte Chatmon Annabelle Harman

## 2022-12-30 NOTE — Inpatient Diabetes Management (Addendum)
Inpatient Diabetes Program Recommendations  AACE/ADA: New Consensus Statement on Inpatient Glycemic Control (2015)  Target Ranges:  Prepandial:   less than 140 mg/dL      Peak postprandial:   less than 180 mg/dL (1-2 hours)      Critically ill patients:  140 - 180 mg/dL   Lab Results  Component Value Date   GLUCAP 453 (H) 12/30/2022   HGBA1C 10.0 (H) 12/30/2022    Review of Glycemic Control  Diabetes history: new onset Outpatient Diabetes medications: none Current orders for Inpatient glycemic control: Semglee 25 units daily, Novolog SENSITIVE correction scale TID, Novolog 0-5 units HS sclae, Novolog 4 units TID with meals  Inpatient Diabetes Program Recommendations:   Spoke with patient and her brother at the bedside. Patient is cared for by her brother, but he is a Naval architect and is gone during the day. He has an agency with CMA's that help with his sister at home at least 4 hours per day, but is getting more help for the evening hours. Brother states that he does not think that the help would be able to give insulin, if he is not available. Suggested that he call the agency to see if the CMAs that are working for her can give insulin. Patient is reluctant to take injections. She would prefer oral agents.  Discussed HgbA1C of 10% and diagnosis of diabetes. The patient's grandmother and another brother have had diabetes. Discussed patient's eating and diet; brother states that she eats ice cream and enjoys some sweets. She usually only eats 2 meals per day and snacks. Patient has been bedridden for at least 7 years.   Was able to demonstrate insulin pen for brother. Patient was continuing to refuse to take insulin at home. Brother will speak with CMA agency to see if they can give insulin.   Will continue to monitor blood sugars while in hospital. Ordered Living Well with Diabetes booklet.   Smith Mince RN BSN CDE Diabetes Coordinator Pager: [phone]  8am-5pm

## 2022-12-30 NOTE — Progress Notes (Signed)
PROGRESS NOTE                                                                                                                                                                                                             Patient Demographics:    Brandy Mckinney, is a 57 y.o. female, DOB - 04-24-1966, ZOX:[id number]  Outpatient Primary MD for the patient is Shon Hale, MD    LOS - 1  Admit date - 12/29/2022    Chief Complaint  Patient presents with   Altered Mental Status       Brief Narrative (HPI from H&P)    57 y.o. female with medical history significant for hypertension, hyperlipidemia, depression, multiple sclerosis, and Alzheimer dementia, now presenting to the emergency department with confusion, lethargy, loss of appetite, polyuria, and polydipsia.  In the hospital she was diagnosed with severe metabolic encephalopathy due to nonketotic hyperosmolar state, severe dehydration.  And admitted to the hospital.   Subjective:    Brandy Mckinney today has, No headache, No chest pain, No abdominal pain - No Nausea, No new weakness tingling or numbness, no SOB.   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  She has been started on Endo tool with insulin drip, adequately hydrated, mentation improving, no headache or focal deficits, head CT nonacute, started on Semglee and sliding scale, diabetic and insulin education.  Mentation much improved.  Continue to monitor.   Lab Results  Component Value Date   HGBA1C 10.0 (H) 12/30/2022   CBG (last 3)  Recent Labs    12/30/22 0507 12/30/22 0634 12/30/22 0945  GLUCAP 246* 292* 329*    Sepsis due to UTI.  On Rocephin.  Sepsis pathophysiology resolved.  Follow cultures.  History of seizures.  On Keppra.  Obtain EEG due to encephalopathy.  Doubt she is  seizing.  Hypertension.  Norvasc.  Dyslipidemia -  on statin.  History of Alzheimer's dementia and MS - supportive Rx, PT- OT, SLP.      Condition - Extremely Guarded  Family Communication  :  brother Maisie Fus [phone]  - on 12/30/22  Code Status :  Full  Consults  :    PUD Prophylaxis :    Procedures  :     EEG -  CT head - Non acute  CT Abd - Pelvis - 1. No acute localizing process in the abdomen or pelvis. 2. Large amount of stool throughout the colon. The rectum is dilated and stool-filled measuring 8.5 cm in transverse dimension. 3. Colonic diverticulosis without evidence for diverticulitis. 4. Cholelithiasis. 5. Fatty infiltration of the liver with hepatomegaly. 6. 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 Aortic Atherosclerosis      Disposition Plan  :    Status is: Inpatient   DVT Prophylaxis  :    enoxaparin (LOVENOX) injection 40 mg Start: 12/30/22 1000    Lab Results  Component Value Date   PLT 192 12/30/2022    Diet :  Diet Order             DIET SOFT Room service appropriate? No; Fluid consistency: Thin  Diet effective now                    Inpatient Medications  Scheduled Meds:  amLODipine  2.5 mg Oral Daily   enoxaparin (LOVENOX) injection  40 mg Subcutaneous Q24H   FLUoxetine  20 mg Oral Daily   gabapentin  300 mg Oral TID   insulin aspart  0-15 Units Subcutaneous TID WC   insulin aspart  0-5 Units Subcutaneous QHS   insulin aspart  3 Units Subcutaneous TID WC   insulin glargine-yfgn  25 Units Subcutaneous Daily   levETIRAcetam  1,000 mg Oral BID   pantoprazole  40 mg Oral Daily   simvastatin  20 mg Oral QPM   Continuous Infusions:  cefTRIAXone (ROCEPHIN)  IV 2 g (12/30/22 0518)   lactated ringers 75 mL/hr at 12/30/22 0638   PRN Meds:.dextrose  Antibiotics  :    Anti-infectives (From admission, onward)    Start     Dose/Rate Route Frequency Ordered Stop    12/30/22 2030  vancomycin (VANCOCIN) IVPB 1000 mg/200 mL premix  Status:  Discontinued        1,000 mg 200 mL/hr over 60 Minutes Intravenous Every 24 hours 12/29/22 2021 12/29/22 2355   12/30/22 0600  cefTRIAXone (ROCEPHIN) 2 g in sodium chloride 0.9 % 100 mL IVPB        2 g 200 mL/hr over 30 Minutes Intravenous Every 24 hours 12/29/22 2355 01/06/23 0559   12/29/22 2030  ceFEPIme (MAXIPIME) 2 g in sodium chloride 0.9 % 100 mL IVPB  Status:  Discontinued        2 g 200 mL/hr over 30 Minutes Intravenous Every 8 hours 12/29/22 2020 12/29/22 2355   12/29/22 2030  vancomycin (VANCOREADY) IVPB 1750 mg/350 mL        1,750 mg 175 mL/hr over 120 Minutes Intravenous  Once 12/29/22 2020 12/30/22 0330   12/29/22 2015  ceFEPIme (MAXIPIME) 2 g in sodium chloride 0.9 % 100 mL IVPB  Status:  Discontinued        2 g 200 mL/hr over 30 Minutes Intravenous  Once 12/29/22 2013 12/29/22 2020   12/29/22 2015  metroNIDAZOLE (FLAGYL) IVPB 500 mg  Status:  Discontinued        500 mg 100 mL/hr over 60 Minutes Intravenous  Once 12/29/22 2013 12/29/22 2355   12/29/22 2015  vancomycin (VANCOCIN) IVPB 1000 mg/200 mL premix  Status:  Discontinued        1,000 mg 200 mL/hr over 60 Minutes Intravenous  Once 12/29/22 2013 12/29/22 2020         Objective:  Vitals:   12/30/22 0000 12/30/22 0311 12/30/22 0536 12/30/22 0800  BP: (!) 166/102 (!) 145/107 (!) 142/96   Pulse: (!) 121 95 80   Resp: 15 14 12    Temp:  98.9 F (37.2 C) 98 F (36.7 C) (!) 97.4 F (36.3 C)  TempSrc:  Oral Oral Oral  SpO2: 93% 98%    Weight:   81 kg   Height:   5\' 2"  (1.575 m)     Wt Readings from Last 3 Encounters:  12/30/22 81 kg  10/27/22 81.6 kg  05/12/22 86.2 kg     Intake/Output Summary (Last 24 hours) at 12/30/2022 1024 Last data filed at 12/30/2022 0008 Gross per 24 hour  Intake 2840.82 ml  Output --  Net 2840.82 ml     Physical Exam  Awake Alert, No new F.N deficits, Normal affect [place].AT,PERRAL Supple Neck, No JVD,    Symmetrical Chest wall movement, Good air movement bilaterally, CTAB RRR,No Gallops,Rubs or new Murmurs,  +ve B.Sounds, Abd Soft, No tenderness,   No Cyanosis, Clubbing or edema       Data Review:    Recent Labs  Lab 12/29/22 1901 12/29/22 1911 12/29/22 1919 12/30/22 0613  WBC 8.2  --   --  7.0  HGB 18.7* 19.4* 19.0* 16.1*  HCT 57.4* 57.0* 56.0* 50.4*  PLT 274  --   --  192  MCV 96.3  --   --  97.3  MCH 31.4  --   --  31.1  MCHC 32.6  --   --  31.9  RDW 14.6  --   --  14.5  LYMPHSABS 1.5  --   --   --   MONOABS 0.7  --   --   --   EOSABS 0.1  --   --   --   BASOSABS 0.1  --   --   --     Recent Labs  Lab 12/29/22 1901 12/29/22 1911 12/29/22 1919 12/29/22 2100 12/30/22 0110 12/30/22 0613  NA 145 147* 146*  --  145  --   K 4.1 4.1 4.1  --  3.4*  --   CL 105 108  --   --  108  --   CO2 24  --   --   --  26  --   ANIONGAP 16*  --   --   --  11  --   GLUCOSE 612* 639*  --   --  304*  --   BUN 14 14  --   --  10  --   CREATININE 0.75 0.60  --   --  0.56  --   AST 37  --   --   --   --   --   ALT 65*  --   --   --   --   --   ALKPHOS 148*  --   --   --   --   --   BILITOT 1.2  --   --   --   --   --   ALBUMIN 4.0  --   --   --   --   --   CRP  --   --   --   --   --  0.9  PROCALCITON  --   --   --   --   --  <0.10  LATICACIDVEN 3.2*  --   --  4.0* 1.9  --   INR 1.0  --   --   --   --   --  HGBA1C  --   --   --   --  10.0*  --   AMMONIA  --   --   --   --   --  38*  BNP  --   --   --   --   --  10.3  MG  --   --   --   --  1.8 1.8  CALCIUM 10.5*  --   --   --  9.0  --       Recent Labs  Lab 12/29/22 1901 12/29/22 2100 12/30/22 0110 12/30/22 0613  CRP  --   --   --  0.9  PROCALCITON  --   --   --  <0.10  LATICACIDVEN 3.2* 4.0* 1.9  --   INR 1.0  --   --   --   HGBA1C  --   --  10.0*  --   AMMONIA  --   --   --  38*  BNP  --   --   --  10.3  MG  --   --  1.8 1.8  CALCIUM 10.5*  --  9.0  --     Recent Labs  Lab 12/29/22 1901 12/29/22 1911  12/29/22 2100 12/30/22 0110 12/30/22 0613  WBC 8.2  --   --   --  7.0  PLT 274  --   --   --  192  CRP  --   --   --   --  0.9  PROCALCITON  --   --   --   --  <0.10  LATICACIDVEN 3.2*  --  4.0* 1.9  --   CREATININE 0.75 0.60  --  0.56  --     ------------------------------------------------------------------------------------------------------------------ No results found for: "CHOL", "HDL", "LDLCALC", "LDLDIRECT", "TRIG", "CHOLHDL"  Lab Results  Component Value Date   HGBA1C 10.0 (H) 12/30/2022    No results for input(s): "TSH", "T4TOTAL", "T3FREE", "THYROIDAB" in the last 72 hours.  Invalid input(s): "FREET3" ------------------------------------------------------------------------------------------------------------------ Cardiac Enzymes No results for input(s): "CKMB", "TROPONINI", "MYOGLOBIN" in the last 168 hours.  Invalid input(s): "CK"  Micro Results Recent Results (from the past 240 hour(s))  Culture, blood (Routine x 2)     Status: None (Preliminary result)   Collection Time: 12/29/22  7:03 PM   Specimen: BLOOD RIGHT HAND  Result Value Ref Range Status   Specimen Description BLOOD RIGHT HAND  Final   Special Requests   Final    BOTTLES DRAWN AEROBIC AND ANAEROBIC Blood Culture adequate volume   Culture   Final    NO GROWTH < 24 HOURS Performed at [facility], [address] [place], [place] [address]    Report Status PENDING  Incomplete  Culture, blood (Routine x 2)     Status: None (Preliminary result)   Collection Time: 12/29/22  9:00 PM   Specimen: BLOOD  Result Value Ref Range Status   Specimen Description BLOOD SITE NOT SPECIFIED  Final   Special Requests   Final    BOTTLES DRAWN AEROBIC AND ANAEROBIC Blood Culture adequate volume   Culture   Final    NO GROWTH < 12 HOURS Performed at [facility], [address] [place], [place] [address]    Report Status PENDING  Incomplete    Radiology Reports DG Chest Port 1 View  Result  Date: 12/30/2022 CLINICAL DATA:  Shortness of breath. EXAM: PORTABLE CHEST 1 VIEW COMPARISON:  December 29, 2022. FINDINGS: The  heart size and mediastinal contours are within normal limits. Both lungs are clear. The visualized skeletal structures are unremarkable. IMPRESSION: No active disease. Electronically Signed   By: Lupita Raider M.D.   On: 12/30/2022 10:05   CT ABDOMEN PELVIS W CONTRAST  Result Date: 12/29/2022 CLINICAL DATA:  Sepsis EXAM: CT ABDOMEN AND PELVIS WITH CONTRAST TECHNIQUE: Multidetector CT imaging of the abdomen and pelvis was performed using the standard protocol following bolus administration of intravenous contrast. RADIATION DOSE REDUCTION: This exam was performed according to the departmental dose-optimization program which includes automated exposure control, adjustment of the mA and/or kV according to patient size and/or use of iterative reconstruction technique. CONTRAST:  75mL OMNIPAQUE IOHEXOL 350 MG/ML SOLN COMPARISON:  Chest CT 06/15/2021 FINDINGS: Lower chest: There is atelectasis in the lung bases. Hepatobiliary: The liver is enlarged and there is diffuse fatty infiltration. Gallstones are present. No biliary ductal dilatation identified. Pancreas: Unremarkable. No pancreatic ductal dilatation or surrounding inflammatory changes. Spleen: Normal in size without focal abnormality. Adrenals/Urinary Tract: There is a heterogeneous nodule in the left adrenal gland measuring 2.2 x 3.4 by 1.9 cm containing some focal areas of macroscopic fat. The right adrenal gland and bilateral kidneys are within normal limits. The bladder is within normal limits. Stomach/Bowel: Stomach is within normal limits. Appendix appears normal. No evidence of bowel wall thickening, distention, or inflammatory changes. There is sigmoid colon diverticulosis. There is a large amount of stool throughout the colon. The rectum is dilated and stool-filled measuring 8.5 cm in transverse dimension. Vascular/Lymphatic:  Aortic atherosclerosis. No enlarged abdominal or pelvic lymph nodes. Reproductive: Status post hysterectomy. No adnexal masses. Other: No abdominal wall hernia or abnormality. No abdominopelvic ascites. Musculoskeletal: No acute or significant osseous findings. IMPRESSION: 1. No acute localizing process in the abdomen or pelvis. 2. Large amount of stool throughout the colon. The rectum is dilated and stool-filled measuring 8.5 cm in transverse dimension. 3. Colonic diverticulosis without evidence for diverticulitis. 4. Cholelithiasis. 5. Fatty infiltration of the liver with hepatomegaly. 6. 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 Aortic Atherosclerosis (ICD10-I70.0). Electronically Signed   By: Darliss Cheney M.D.   On: 12/29/2022 22:38   CT Head Wo Contrast  Result Date: 12/29/2022 CLINICAL DATA:  Mental status change, unknown cause EXAM: CT HEAD WITHOUT CONTRAST TECHNIQUE: Contiguous axial images were obtained from the base of the skull through the vertex without intravenous contrast. RADIATION DOSE REDUCTION: This exam was performed according to the departmental dose-optimization program which includes automated exposure control, adjustment of the mA and/or kV according to patient size and/or use of iterative reconstruction technique. COMPARISON:  10/27/2022 FINDINGS: Brain: Severe diffuse atrophy, most advanced in the frontal lobes. Advanced chronic small vessel disease throughout the deep white matter. No acute intracranial abnormality. Specifically, no hemorrhage, hydrocephalus, mass lesion, acute infarction, or significant intracranial injury. Vascular: No hyperdense vessel or unexpected calcification. Skull: No acute calvarial abnormality. Sinuses/Orbits: No acute findings Other: None IMPRESSION: Severe atrophy, chronic small vessel disease, stable. No acute intracranial abnormality. Electronically Signed   By: Charlett Nose M.D.   On: 12/29/2022  22:35   DG Chest Portable 1 View  Result Date: 12/29/2022 CLINICAL DATA:  Altered mental status EXAM: PORTABLE CHEST 1 VIEW COMPARISON:  Chest radiograph and CT 06/15/2021 FINDINGS: Stable cardiomediastinal silhouette given patient rotation. Left basilar atelectasis. Otherwise no focal consolidation. No pleural effusion or pneumothorax. No displaced rib fractures. IMPRESSION: No active disease. Electronically Signed   By: Joselyn Glassman  Stutzman M.D.   On: 12/29/2022 19:50      Signature  -   Susa Raring M.D on 12/30/2022 at 10:24 AM   -  To page go to [email or web address]

## 2022-12-31 ENCOUNTER — Other Ambulatory Visit (HOSPITAL_COMMUNITY): Payer: Self-pay

## 2022-12-31 ENCOUNTER — Telehealth (HOSPITAL_COMMUNITY): Payer: Self-pay | Admitting: Pharmacy Technician

## 2022-12-31 DIAGNOSIS — N390 Urinary tract infection, site not specified: Secondary | ICD-10-CM | POA: Diagnosis not present

## 2022-12-31 DIAGNOSIS — A419 Sepsis, unspecified organism: Secondary | ICD-10-CM | POA: Diagnosis not present

## 2022-12-31 LAB — BASIC METABOLIC PANEL
Anion gap: 9 (ref 5–15)
BUN: 9 mg/dL (ref 6–20)
CO2: 26 mmol/L (ref 22–32)
Calcium: 8.3 mg/dL — ABNORMAL LOW (ref 8.9–10.3)
Chloride: 103 mmol/L (ref 98–111)
Creatinine, Ser: 0.44 mg/dL (ref 0.44–1.00)
GFR, Estimated: >60 mL/min (ref >60)
Glucose, Bld: 305 mg/dL — ABNORMAL HIGH (ref 70–99)
Potassium: 3.7 mmol/L (ref 3.5–5.1)
Sodium: 138 mmol/L (ref 135–145)

## 2022-12-31 LAB — CBC WITH DIFFERENTIAL/PLATELET
Abs Immature Granulocytes: 0.07 10*3/uL (ref 0.00–0.07)
Basophils Absolute: 0 10*3/uL (ref 0.0–0.1)
Basophils Relative: 0 %
Eosinophils Absolute: 0.3 10*3/uL (ref 0.0–0.5)
Eosinophils Relative: 4 %
HCT: 44.7 % (ref 36.0–46.0)
Hemoglobin: 14.6 g/dL (ref 12.0–15.0)
Immature Granulocytes: 1 %
Lymphocytes Relative: 18 %
Lymphs Abs: 1.2 10*3/uL (ref 0.7–4.0)
MCH: 30.8 pg (ref 26.0–34.0)
MCHC: 32.7 g/dL (ref 30.0–36.0)
MCV: 94.3 fL (ref 80.0–100.0)
Monocytes Absolute: 0.4 10*3/uL (ref 0.1–1.0)
Monocytes Relative: 6 %
Neutro Abs: 4.9 10*3/uL (ref 1.7–7.7)
Neutrophils Relative %: 71 %
Platelets: 159 10*3/uL (ref 150–400)
RBC: 4.74 MIL/uL (ref 3.87–5.11)
RDW: 13.8 % (ref 11.5–15.5)
WBC: 6.9 10*3/uL (ref 4.0–10.5)
nRBC: 0 % (ref 0.0–0.2)

## 2022-12-31 LAB — GLUCOSE, CAPILLARY
Glucose-Capillary: 254 mg/dL — ABNORMAL HIGH (ref 70–99)
Glucose-Capillary: 283 mg/dL — ABNORMAL HIGH (ref 70–99)
Glucose-Capillary: 207 mg/dL — ABNORMAL HIGH (ref 70–99)
Glucose-Capillary: 186 mg/dL — ABNORMAL HIGH (ref 70–99)

## 2022-12-31 LAB — MAGNESIUM: Magnesium: 1.7 mg/dL (ref 1.7–2.4)

## 2022-12-31 LAB — PROCALCITONIN: Procalcitonin: <0.1 ng/mL

## 2022-12-31 LAB — AMMONIA: Ammonia: 30 umol/L (ref 9–35)

## 2022-12-31 LAB — C-REACTIVE PROTEIN: CRP: 1.4 mg/dL — ABNORMAL HIGH (ref <1.0)

## 2022-12-31 MED ORDER — INSULIN STARTER KIT- PEN NEEDLES (ENGLISH)
1.0000 | Freq: Once | Status: AC
  Administered 2022-12-31: 1
  Filled 2022-12-31: qty 1

## 2022-12-31 MED ORDER — INSULIN GLARGINE-YFGN 100 UNIT/ML ~~LOC~~ SOLN
45.0000 [IU] | Freq: Every day | SUBCUTANEOUS | Status: DC
  Administered 2022-12-31: 45 [IU] via SUBCUTANEOUS
  Filled 2022-12-31 (×2): qty 0.45

## 2022-12-31 MED ORDER — HYDRALAZINE HCL 20 MG/ML IJ SOLN: 10.0000 mg | Freq: Four times a day (QID) | INTRAMUSCULAR | Status: DC | PRN

## 2022-12-31 MED ORDER — INSULIN ASPART 100 UNIT/ML IJ SOLN
0.0000 [IU] | Freq: Three times a day (TID) | INTRAMUSCULAR | Status: DC
  Administered 2022-12-31: 7 [IU] via SUBCUTANEOUS
  Administered 2022-12-31 (×2): 11 [IU] via SUBCUTANEOUS
  Administered 2023-01-01: 7 [IU] via SUBCUTANEOUS
  Administered 2023-01-01: 15 [IU] via SUBCUTANEOUS

## 2022-12-31 MED ORDER — INSULIN ASPART 100 UNIT/ML IJ SOLN
0.0000 [IU] | Freq: Every day | INTRAMUSCULAR | Status: DC
  Administered 2022-12-31: 0 [IU] via SUBCUTANEOUS

## 2022-12-31 MED ORDER — ENSURE MAX PROTEIN PO LIQD
11.0000 [oz_av] | Freq: Every day | ORAL | Status: DC
  Administered 2023-01-01: 11 [oz_av] via ORAL
  Filled 2022-12-31: qty 330

## 2022-12-31 MED ORDER — INSULIN ASPART 100 UNIT/ML IJ SOLN
5.0000 [IU] | Freq: Three times a day (TID) | INTRAMUSCULAR | Status: DC
  Administered 2022-12-31 – 2023-01-01 (×5): 5 [IU] via SUBCUTANEOUS

## 2022-12-31 NOTE — Telephone Encounter (Signed)
Patient Advocate Encounter   Received notification that prior authorization for FreeStyle Libre 3 Sensor is required.   PA submitted on 12/31/2022 Key [id number] Insurance [employer] Medicare Part D Electronic Prior Authorization Form Status is pending       Roland Earl, CPhT Pharmacy Patient Advocate Specialist [facility] Patient Advocate Team Direct Number: [phone]  Fax: [phone]

## 2022-12-31 NOTE — Progress Notes (Signed)
PROGRESS NOTE                                                                                                                                                                                                             Patient Demographics:    Brandy Mckinney, is a 57 y.o. female, DOB - 1966/03/17, GNF:[id number]  Outpatient Primary MD for the patient is Shon Hale, MD    LOS - 2  Admit date - 12/29/2022    Chief Complaint  Patient presents with   Altered Mental Status       Brief Narrative (HPI from H&P)    57 y.o. female with medical history significant for hypertension, hyperlipidemia, depression, multiple sclerosis, and Alzheimer dementia, now presenting to the emergency department with confusion, lethargy, loss of appetite, polyuria, and polydipsia.  In the hospital she was diagnosed with severe metabolic encephalopathy due to nonketotic hyperosmolar state, severe dehydration.  And admitted to the hospital.   Subjective:    Patient in bed, appears comfortable, denies any headache, no fever, no chest pain or pressure, no shortness of breath , no abdominal pain. No new focal weakness.   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  She has been started on Endo tool with insulin drip, adequately hydrated, mentation improving, no headache or focal deficits, head CT nonacute, started on Semglee and sliding scale, dose adjusted further on 12/31/2022, diabetic and insulin education to patient and her brother, plan discussed with the brother.  Mentation much improved.  Continue to monitor.   Lab Results  Component Value Date   HGBA1C 10.0 (H) 12/30/2022   CBG (last 3)  Recent Labs    12/30/22 1717 12/30/22 2122 12/31/22 0852  GLUCAP 270* 336* 254*    Sepsis due to UTI.  On Rocephin.  Sepsis pathophysiology resolved.   Follow cultures.  History of seizures.  No acute issues EEG stable, continue home Keppra.  Hypertension.  Norvasc.  Dyslipidemia -  on statin.  History of Alzheimer's dementia and MS - supportive Rx, PT- OT, SLP.      Condition - Extremely Guarded  Family Communication  :  brother Maisie Fus [phone]  - on 12/30/22, 12/31/22  Code Status :  Full  Consults  :  PUD Prophylaxis :    Procedures  :     EEG - Non acute  CT head - Non acute  CT Abd - Pelvis - 1. No acute localizing process in the abdomen or pelvis. 2. Large amount of stool throughout the colon. The rectum is dilated and stool-filled measuring 8.5 cm in transverse dimension. 3. Colonic diverticulosis without evidence for diverticulitis. 4. Cholelithiasis. 5. Fatty infiltration of the liver with hepatomegaly. 6. 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 Aortic Atherosclerosis      Disposition Plan  :    Status is: Inpatient   DVT Prophylaxis  :    enoxaparin (LOVENOX) injection 40 mg Start: 12/30/22 1000    Lab Results  Component Value Date   PLT 159 12/31/2022    Diet :  Diet Order             DIET DYS 3 Room service appropriate? Yes; Fluid consistency: Thin  Diet effective now                    Inpatient Medications  Scheduled Meds:  aspirin  81 mg Oral Daily   enoxaparin (LOVENOX) injection  40 mg Subcutaneous Q24H   FLUoxetine  20 mg Oral Daily   gabapentin  300 mg Oral TID   insulin aspart  0-20 Units Subcutaneous TID WC   insulin aspart  0-5 Units Subcutaneous QHS   insulin aspart  5 Units Subcutaneous TID WC   insulin glargine-yfgn  45 Units Subcutaneous Daily   levETIRAcetam  1,000 mg Oral BID   pantoprazole  40 mg Oral Daily   simvastatin  20 mg Oral QPM   Continuous Infusions:  cefTRIAXone (ROCEPHIN)  IV 2 g (12/31/22 0525)   PRN Meds:.dextrose, hydrALAZINE    Objective:   Vitals:   12/30/22 2200 12/31/22 0000  12/31/22 0400 12/31/22 0800  BP: 121/84 118/85 (!) 127/105   Pulse: 95 100 92   Resp: 15 12 18    Temp: 98 F (36.7 C) 97.9 F (36.6 C) 98 F (36.7 C) 97.7 F (36.5 C)  TempSrc:  Oral Oral Oral  SpO2: 96% 92% 92%   Weight:      Height:        Wt Readings from Last 3 Encounters:  12/30/22 81 kg  10/27/22 81.6 kg  05/12/22 86.2 kg     Intake/Output Summary (Last 24 hours) at 12/31/2022 1037 Last data filed at 12/31/2022 0439 Gross per 24 hour  Intake --  Output 600 ml  Net -600 ml     Physical Exam  Awake Alert, No new F.N deficits, chronic generalized weakness due to underlying MS. [place].AT,PERRAL Supple Neck, No JVD,   Symmetrical Chest wall movement, Good air movement bilaterally, CTAB RRR,No Gallops,Rubs or new Murmurs,  +ve B.Sounds, Abd Soft, No tenderness,   No Cyanosis, Clubbing or edema       Data Review:    Recent Labs  Lab 12/29/22 1901 12/29/22 1911 12/29/22 1919 12/30/22 0613 12/31/22 0628  WBC 8.2  --   --  7.0 6.9  HGB 18.7* 19.4* 19.0* 16.1* 14.6  HCT 57.4* 57.0* 56.0* 50.4* 44.7  PLT 274  --   --  192 159  MCV 96.3  --   --  97.3 94.3  MCH 31.4  --   --  31.1 30.8  MCHC 32.6  --   --  31.9 32.7  RDW 14.6  --   --  14.5 13.8  LYMPHSABS 1.5  --   --   --  1.2  MONOABS 0.7  --   --   --  0.4  EOSABS 0.1  --   --   --  0.3  BASOSABS 0.1  --   --   --  0.0    Recent Labs  Lab 12/29/22 1901 12/29/22 1911 12/29/22 1919 12/29/22 2100 12/30/22 0110 12/30/22 0613 12/31/22 0628  NA 145 147* 146*  --  145  --  138  K 4.1 4.1 4.1  --  3.4*  --  3.7  CL 105 108  --   --  108  --  103  CO2 24  --   --   --  26  --  26  ANIONGAP 16*  --   --   --  11  --  9  GLUCOSE 612* 639*  --   --  304*  --  305*  BUN 14 14  --   --  10  --  9  CREATININE 0.75 0.60  --   --  0.56  --  0.44  AST 37  --   --   --   --   --   --   ALT 65*  --   --   --   --   --   --   ALKPHOS 148*  --   --   --   --   --   --   BILITOT 1.2  --   --   --   --   --   --    ALBUMIN 4.0  --   --   --   --   --   --   CRP  --   --   --   --   --  0.9 1.4*  PROCALCITON  --   --   --   --   --  <0.10 <0.10  LATICACIDVEN 3.2*  --   --  4.0* 1.9  --   --   INR 1.0  --   --   --   --   --   --   HGBA1C  --   --   --   --  10.0*  --   --   AMMONIA  --   --   --   --   --  38* 30  BNP  --   --   --   --   --  10.3  --   MG  --   --   --   --  1.8 1.8 1.7  CALCIUM 10.5*  --   --   --  9.0  --  8.3*      Recent Labs  Lab 12/29/22 1901 12/29/22 2100 12/30/22 0110 12/30/22 0613 12/31/22 0628  CRP  --   --   --  0.9 1.4*  PROCALCITON  --   --   --  <0.10 <0.10  LATICACIDVEN 3.2* 4.0* 1.9  --   --   INR 1.0  --   --   --   --   HGBA1C  --   --  10.0*  --   --   AMMONIA  --   --   --  38* 30  BNP  --   --   --  10.3  --   MG  --   --  1.8 1.8 1.7  CALCIUM 10.5*  --  9.0  --  8.3*    Recent Labs  Lab 12/29/22 1901 12/29/22 1911 12/29/22 2100 12/30/22 0110 12/30/22 0613 12/31/22 0628  WBC 8.2  --   --   --  7.0 6.9  PLT 274  --   --   --  192 159  CRP  --   --   --   --  0.9 1.4*  PROCALCITON  --   --   --   --  <0.10 <0.10  LATICACIDVEN 3.2*  --  4.0* 1.9  --   --   CREATININE 0.75 0.60  --  0.56  --  0.44    ------------------------------------------------------------------------------------------------------------------ No results found for: "CHOL", "HDL", "LDLCALC", "LDLDIRECT", "TRIG", "CHOLHDL"  Lab Results  Component Value Date   HGBA1C 10.0 (H) 12/30/2022    No results for input(s): "TSH", "T4TOTAL", "T3FREE", "THYROIDAB" in the last 72 hours.  Invalid input(s): "FREET3" ------------------------------------------------------------------------------------------------------------------ Cardiac Enzymes No results for input(s): "CKMB", "TROPONINI", "MYOGLOBIN" in the last 168 hours.  Invalid input(s): "CK"  Micro Results Recent Results (from the past 240 hour(s))  Culture, blood (Routine x 2)     Status: None (Preliminary result)    Collection Time: 12/29/22  7:03 PM   Specimen: BLOOD RIGHT HAND  Result Value Ref Range Status   Specimen Description BLOOD RIGHT HAND  Final   Special Requests   Final    BOTTLES DRAWN AEROBIC AND ANAEROBIC Blood Culture adequate volume   Culture   Final    NO GROWTH < 24 HOURS Performed at [facility], [address] [place], [place] [address]    Report Status PENDING  Incomplete  Culture, blood (Routine x 2)     Status: None (Preliminary result)   Collection Time: 12/29/22  9:00 PM   Specimen: BLOOD  Result Value Ref Range Status   Specimen Description BLOOD SITE NOT SPECIFIED  Final   Special Requests   Final    BOTTLES DRAWN AEROBIC AND ANAEROBIC Blood Culture adequate volume   Culture   Final    NO GROWTH < 12 HOURS Performed at [facility], [address] [place], [place] [address]    Report Status PENDING  Incomplete    Radiology Reports EEG adult  Result Date: 12/30/2022 Charlsie Quest, MD     12/30/2022  5:19 PM Patient Name: Cattie Tineo MRN: [id number] Epilepsy Attending: Charlsie Quest Referring Physician/Provider: Leroy Sea, MD Date: 12/30/2022 Duration: 23.21 mins Patient history: 57yo M with ams getting eeg to evaluate for seizure Level of alertness: Awake AEDs during EEG study: GBP, LEV Technical aspects: This EEG study was done with scalp electrodes positioned according to the 10-20 International system of electrode placement. Electrical activity was reviewed with band pass filter of 1-70Hz , sensitivity of 7 uV/mm, display speed of 26mm/sec with a 60Hz  notched filter applied as appropriate. EEG data were recorded continuously and digitally stored.  Video monitoring was available and reviewed as appropriate. Description: The posterior dominant rhythm consists of 9 Hz activity of moderate voltage (25-35 uV) seen predominantly in posterior head regions, symmetric and reactive to eye opening and eye closing. Hyperventilation and photic  stimulation were not performed.   IMPRESSION: This study is within normal limits. No seizures or epileptiform discharges were seen throughout the recording. A normal interictal EEG does not exclude the diagnosis of epilepsy. Charlsie Quest   DG Chest Port 1 View  Result Date: 12/30/2022 CLINICAL DATA:  Shortness of breath. EXAM: PORTABLE  CHEST 1 VIEW COMPARISON:  December 29, 2022. FINDINGS: The heart size and mediastinal contours are within normal limits. Both lungs are clear. The visualized skeletal structures are unremarkable. IMPRESSION: No active disease. Electronically Signed   By: Lupita Raider M.D.   On: 12/30/2022 10:05   CT ABDOMEN PELVIS W CONTRAST  Result Date: 12/29/2022 CLINICAL DATA:  Sepsis EXAM: CT ABDOMEN AND PELVIS WITH CONTRAST TECHNIQUE: Multidetector CT imaging of the abdomen and pelvis was performed using the standard protocol following bolus administration of intravenous contrast. RADIATION DOSE REDUCTION: This exam was performed according to the departmental dose-optimization program which includes automated exposure control, adjustment of the mA and/or kV according to patient size and/or use of iterative reconstruction technique. CONTRAST:  75mL OMNIPAQUE IOHEXOL 350 MG/ML SOLN COMPARISON:  Chest CT 06/15/2021 FINDINGS: Lower chest: There is atelectasis in the lung bases. Hepatobiliary: The liver is enlarged and there is diffuse fatty infiltration. Gallstones are present. No biliary ductal dilatation identified. Pancreas: Unremarkable. No pancreatic ductal dilatation or surrounding inflammatory changes. Spleen: Normal in size without focal abnormality. Adrenals/Urinary Tract: There is a heterogeneous nodule in the left adrenal gland measuring 2.2 x 3.4 by 1.9 cm containing some focal areas of macroscopic fat. The right adrenal gland and bilateral kidneys are within normal limits. The bladder is within normal limits. Stomach/Bowel: Stomach is within normal limits. Appendix appears  normal. No evidence of bowel wall thickening, distention, or inflammatory changes. There is sigmoid colon diverticulosis. There is a large amount of stool throughout the colon. The rectum is dilated and stool-filled measuring 8.5 cm in transverse dimension. Vascular/Lymphatic: Aortic atherosclerosis. No enlarged abdominal or pelvic lymph nodes. Reproductive: Status post hysterectomy. No adnexal masses. Other: No abdominal wall hernia or abnormality. No abdominopelvic ascites. Musculoskeletal: No acute or significant osseous findings. IMPRESSION: 1. No acute localizing process in the abdomen or pelvis. 2. Large amount of stool throughout the colon. The rectum is dilated and stool-filled measuring 8.5 cm in transverse dimension. 3. Colonic diverticulosis without evidence for diverticulitis. 4. Cholelithiasis. 5. Fatty infiltration of the liver with hepatomegaly. 6. 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 Aortic Atherosclerosis (ICD10-I70.0). Electronically Signed   By: Darliss Cheney M.D.   On: 12/29/2022 22:38   CT Head Wo Contrast  Result Date: 12/29/2022 CLINICAL DATA:  Mental status change, unknown cause EXAM: CT HEAD WITHOUT CONTRAST TECHNIQUE: Contiguous axial images were obtained from the base of the skull through the vertex without intravenous contrast. RADIATION DOSE REDUCTION: This exam was performed according to the departmental dose-optimization program which includes automated exposure control, adjustment of the mA and/or kV according to patient size and/or use of iterative reconstruction technique. COMPARISON:  10/27/2022 FINDINGS: Brain: Severe diffuse atrophy, most advanced in the frontal lobes. Advanced chronic small vessel disease throughout the deep white matter. No acute intracranial abnormality. Specifically, no hemorrhage, hydrocephalus, mass lesion, acute infarction, or significant intracranial injury. Vascular: No hyperdense vessel or  unexpected calcification. Skull: No acute calvarial abnormality. Sinuses/Orbits: No acute findings Other: None IMPRESSION: Severe atrophy, chronic small vessel disease, stable. No acute intracranial abnormality. Electronically Signed   By: Charlett Nose M.D.   On: 12/29/2022 22:35   DG Chest Portable 1 View  Result Date: 12/29/2022 CLINICAL DATA:  Altered mental status EXAM: PORTABLE CHEST 1 VIEW COMPARISON:  Chest radiograph and CT 06/15/2021 FINDINGS: Stable cardiomediastinal silhouette given patient rotation. Left basilar atelectasis. Otherwise no focal consolidation. No pleural effusion or pneumothorax. No displaced rib fractures.  IMPRESSION: No active disease. Electronically Signed   By: Minerva Fester M.D.   On: 12/29/2022 19:50      Signature  -   Susa Raring M.D on 12/31/2022 at 10:37 AM   -  To page go to [email or web address]

## 2022-12-31 NOTE — Telephone Encounter (Signed)
Patient Advocate Encounter  Prior Authorization for Brandy Mckinney 3 Sensor  has been approved.    PA# [id number] Insurance [employer] Medicare Part D Electronic Prior Authorization Form  Effective dates: 12/31/2022 through 07/26/2023  Patients co-pay is $0.00.     Roland Earl, CPhT Pharmacy Patient Advocate Specialist [facility] Pharm[facility] Patient Advocate Team Direct Number: [phone]  Fax: [phone]

## 2022-12-31 NOTE — TOC Benefit Eligibility Note (Signed)
Patient Product/process development scientist completed.    The patient is currently admitted and upon discharge could be taking Lantus Pen.  The current 30 day co-pay is $0.00.   The patient is currently admitted and upon discharge could be taking Humalog Pen.  The current 30 day co-pay is $0.00.   The patient is currently admitted and upon discharge could be taking Freestyle [employer]  Requires Prior Authorization  The patient is insured through [employer] Part D   This test claim was processed through [facility]- copay amounts may vary at other pharmacies due to pharmacy/plan contracts, or as the patient moves through the different stages of their insurance plan.  Roland Earl, CPHT Pharmacy Patient Advocate Specialist [facility] Pharm[facility] Patient Advocate Team Direct Number: [phone]  Fax: [phone]

## 2022-12-31 NOTE — Plan of Care (Signed)
  Problem: Education: Goal: Ability to describe self-care measures that may prevent or decrease complications (Diabetes Survival Skills Education) will improve Outcome: Progressing Goal: Individualized Educational Video(s) Outcome: Progressing   Problem: Coping: Goal: Ability to adjust to condition or change in health will improve Outcome: Progressing   Problem: Fluid Volume: Goal: Ability to maintain a balanced intake and output will improve Outcome: Progressing   Problem: Health Behavior/Discharge Planning: Goal: Ability to identify and utilize available resources and services will improve Outcome: Progressing Goal: Ability to manage health-related needs will improve Outcome: Progressing   Problem: Metabolic: Goal: Ability to maintain appropriate glucose levels will improve Outcome: Progressing   Problem: Nutritional: Goal: Maintenance of adequate nutrition will improve Outcome: Progressing Goal: Progress toward achieving an optimal weight will improve Outcome: Progressing   Problem: Skin Integrity: Goal: Risk for impaired skin integrity will decrease Outcome: Progressing   Problem: Tissue Perfusion: Goal: Adequacy of tissue perfusion will improve Outcome: Progressing   Problem: Fluid Volume: Goal: Hemodynamic stability will improve Outcome: Progressing   Problem: Clinical Measurements: Goal: Diagnostic test results will improve Outcome: Progressing Goal: Signs and symptoms of infection will decrease Outcome: Progressing   Problem: Respiratory: Goal: Ability to maintain adequate ventilation will improve Outcome: Progressing   Problem: Education: Goal: Ability to describe self-care measures that may prevent or decrease complications (Diabetes Survival Skills Education) will improve Outcome: Progressing Goal: Individualized Educational Video(s) Outcome: Progressing   Problem: Cardiac: Goal: Ability to maintain an adequate cardiac output will  improve Outcome: Progressing   Problem: Health Behavior/Discharge Planning: Goal: Ability to identify and utilize available resources and services will improve Outcome: Progressing Goal: Ability to manage health-related needs will improve Outcome: Progressing   Problem: Fluid Volume: Goal: Ability to achieve a balanced intake and output will improve Outcome: Progressing   Problem: Metabolic: Goal: Ability to maintain appropriate glucose levels will improve Outcome: Progressing   Problem: Nutritional: Goal: Maintenance of adequate nutrition will improve Outcome: Progressing Goal: Maintenance of adequate weight for body size and type will improve Outcome: Progressing   Problem: Respiratory: Goal: Will regain and/or maintain adequate ventilation Outcome: Progressing   Problem: Urinary Elimination: Goal: Ability to achieve and maintain adequate renal perfusion and functioning will improve Outcome: Progressing   Problem: Education: Goal: Knowledge of General Education information will improve Description: Including pain rating scale, medication(s)/side effects and non-pharmacologic comfort measures Outcome: Progressing   Problem: Health Behavior/Discharge Planning: Goal: Ability to manage health-related needs will improve Outcome: Progressing   Problem: Clinical Measurements: Goal: Ability to maintain clinical measurements within normal limits will improve Outcome: Progressing Goal: Will remain free from infection Outcome: Progressing Goal: Diagnostic test results will improve Outcome: Progressing Goal: Respiratory complications will improve Outcome: Progressing Goal: Cardiovascular complication will be avoided Outcome: Progressing   Problem: Activity: Goal: Risk for activity intolerance will decrease Outcome: Progressing   Problem: Nutrition: Goal: Adequate nutrition will be maintained Outcome: Progressing   Problem: Coping: Goal: Level of anxiety will  decrease Outcome: Progressing   Problem: Elimination: Goal: Will not experience complications related to bowel motility Outcome: Progressing Goal: Will not experience complications related to urinary retention Outcome: Progressing   Problem: Pain Managment: Goal: General experience of comfort will improve Outcome: Progressing   Problem: Safety: Goal: Ability to remain free from injury will improve Outcome: Progressing   Problem: Skin Integrity: Goal: Risk for impaired skin integrity will decrease Outcome: Progressing   

## 2022-12-31 NOTE — Care Management Important Message (Signed)
Important Message  Patient Details  Name: Brandy Mckinney MRN: [id number] Date of Birth: 05-Feb-1966   Medicare Important Message Given:  Yes     Shandon Burlingame Stefan Church 12/31/2022, 3:55 PM

## 2022-12-31 NOTE — Inpatient Diabetes Management (Addendum)
Inpatient Diabetes Program Recommendations  AACE/ADA: New Consensus Statement on Inpatient Glycemic Control (2015)  Target Ranges:  Prepandial:   less than 140 mg/dL      Peak postprandial:   less than 180 mg/dL (1-2 hours)      Critically ill patients:  140 - 180 mg/dL   Lab Results  Component Value Date   GLUCAP 283 (H) 12/31/2022   HGBA1C 10.0 (H) 12/30/2022    Review of Glycemic Control  Diabetes history: new onset Outpatient Diabetes medications: none Current orders for Inpatient glycemic control: Semglee 45 units daily, Novolog 0-20 units correction scale TID, Novolog 0-5 units HS scale, Novolog 5 units TID  Inpatient Diabetes Program Recommendations:  Spoke with patient and brother. Reviewed insulin pen administration; brother returned demonstration without problems.  Did well. Ordered insulin pen starter kit. Reviewed diet and "plate method". Attached diabetes information to discharge papers for references. Encouraged brother to give his sister an insulin injection this evening for practice.   Asked patient and brother about a continuous glucose monitor. Patient does not like being stuck for blood sugars.  At discharge: (will need prior authorization) Freestyle Libre 3 sensor (order [phone]) Dorathy Daft 3 reader (order [phone]) Will also need blood glucose meter kit ordered (#[id number])  Medicare requires use of reader and this will work best for the patient since CMAs will be checking blood sugars if the brother is not available.   Note the benefit check on insulin for this patient's insurance coverage as listed in the progress notes per pharmacy technician.   Smith Mince RN BSN CDE Diabetes Coordinator Pager: [phone]  8am-5pm

## 2022-12-31 NOTE — Progress Notes (Signed)
Initial Nutrition Assessment  DOCUMENTATION CODES:   Obesity unspecified  INTERVENTION:  Provide Ensure Max po once daily, each supplement provides 150 kcal and 30 grams of protein.  Provide 1:1 assistance at meals.  Provided nutrition education regarding new onset DM to patient's brother over the phone (note to follow). Left handouts in room for patient's brother as he reports he is returning to the hospital later this evening.  NUTRITION DIAGNOSIS:   Inadequate oral intake related to decreased appetite as evidenced by per patient/family report.  GOAL:   Patient will meet greater than or equal to 90% of their needs  MONITOR:   PO intake, Supplement acceptance, Labs, Weight trends, I & O's  REASON FOR ASSESSMENT:   Consult Diet education  ASSESSMENT:   57 year old female with PMHx of HTN, HLD, depression, MS, Alzheimer dementia admitted with confusion, lethargy, loss of appetite, polyuria, polydipsia found to have metabolic encephalopathy due to nonketotic hyperosmolar state, severe dehydration, new diagnosis of DM type 2, sepsis, UTI.  6/6: diet changed to dysphagia 3 with thin liquids per recommendation from SLP  RD received consult for diet education regarding new onset DM. Met with pt at bedside. No family available. Pt unable to provide nutrition history and is not appropriate for education.  Spoke with patient's brother Brandy Mckinney over the phone. He reports he cares for patient. Patient has been bedridden for at least 8 years. Family or caregivers provide all meals for pt. They cut food into small pieces. She is edentulous and does not wear any dentures as they don't fit. Pt typically eats 2 meals per day. For breakfast she has [name] toast with sausage and yogurt or applesauce or scrambled eggs with sausage or bacon. For a snack she may have fruit cup, cup cake, brownie, or ice cream. For dinner she has a meat with vegetable and mashed potatoes. She drinks coffee and  sweet tea throughout the day. Denies food allergies or intolerances. Denies nausea, emesis, or abdominal pain. Patient's brother reports she typically has a good appetite and intake at home. Acutely had decreased appetite and intake for several days PTA. Concerned that she is not receiving assistance with meals in the hospital. No meal documentation available at this time. Will place care order to provide assistance with meals. Will also order pt for Ensure Max once daily. Provided nutrition education regarding new onset DM to patient's brother over the phone. Answered all questions regarding nutrition for DM.  Brother denies pt having any weight loss. He reports pt was previously 140 lbs before becoming bedridden and has slowly gained weight over time. Weights in chart from 2022-2023 fluctuate between 79-86 kg. Currently documented to be 81 kg (178.57 lbs).  Medications reviewed and include: gabapentin, Novolog 0-20 units TID, Novolog 0-5 units QHS, Novolog 5 units TID, Semglee 45 units daily, Keppra, pantoprazole, ceftriaxone  Labs reviewed: CBG 254-453, HgbA1c 10 12/30/22  UOP: 600 mL documented UOP previous 24 hours  I/O: +2240.8 mL since admission  NUTRITION - FOCUSED PHYSICAL EXAM:  Flowsheet Row Most Recent Value  Orbital Region No depletion  Upper Arm Region No depletion  Thoracic and Lumbar Region No depletion  Buccal Region No depletion  Temple Region No depletion  Clavicle Bone Region No depletion  Clavicle and Acromion Bone Region No depletion  Scapular Bone Region Unable to assess  Dorsal Hand Mild depletion  [assessed right hand,  left hand contracted]  Patellar Region Mild depletion  Anterior Thigh Region Unable to assess  Posterior Calf Region Moderate depletion  Edema (RD Assessment) Mild  Hair Reviewed  Eyes Reviewed  Mouth Reviewed  [edentulous]  Skin Reviewed  Nails Reviewed       Diet Order:   Diet Order             DIET DYS 3 Room service appropriate? Yes;  Fluid consistency: Thin  Diet effective now                  EDUCATION NEEDS:   Education needs have been addressed  Skin:  Skin Assessment: Reviewed RN Assessment  Last BM:  Unknown  Height:   Ht Readings from Last 1 Encounters:  12/30/22 5\' 2"  (1.575 m)   Weight:   Wt Readings from Last 1 Encounters:  12/30/22 81 kg   Ideal Body Weight:  50 kg  BMI:  Body mass index is 32.66 kg/m.  Estimated Nutritional Needs:   Kcal:  1600-1800  Protein:  85-95 grams  Fluid:  1.6-1.8 L/day  Brandy Median, MS, RD, LDN, CNSC Pager number available on Amion

## 2022-12-31 NOTE — Plan of Care (Signed)
Nutrition Education Note  RD consulted for nutrition education regarding diabetes.   Lab Results  Component Value Date   HGBA1C 10.0 (H) 12/30/2022    Patient not appropriate for education and no family members available at bedside. Spoke with patient's brother over the phone to provide education. RD reviewed "Carbohydrate Counting for People with Diabetes" handout from the Academy of [employer]. Left copy of handout along with handout on reading nutrition facts label at bedside. Discussed different food groups and their effects on blood sugar, emphasizing carbohydrate-containing foods. Provided list of carbohydrates and recommended serving sizes of common foods.  Discussed importance of controlled and consistent carbohydrate intake throughout the day. Provided examples of ways to balance meals/snacks and encouraged intake of high-fiber, whole grain complex carbohydrates. Teach back method used.  Expect good compliance.  Current diet order is dysphagia 3 with thin liquids. No meal documentation available at this time.  Letta Median, MS, RD, LDN, CNSC Pager number available on Amion

## 2023-01-01 ENCOUNTER — Other Ambulatory Visit (HOSPITAL_COMMUNITY): Payer: Self-pay

## 2023-01-01 DIAGNOSIS — A419 Sepsis, unspecified organism: Secondary | ICD-10-CM | POA: Diagnosis not present

## 2023-01-01 DIAGNOSIS — N390 Urinary tract infection, site not specified: Secondary | ICD-10-CM | POA: Diagnosis not present

## 2023-01-01 LAB — CBC WITH DIFFERENTIAL/PLATELET
Abs Immature Granulocytes: 0.08 10*3/uL — ABNORMAL HIGH (ref 0.00–0.07)
Basophils Absolute: 0 10*3/uL (ref 0.0–0.1)
Basophils Relative: 0 %
Eosinophils Absolute: 0.3 10*3/uL (ref 0.0–0.5)
Eosinophils Relative: 4 %
HCT: 43.8 % (ref 36.0–46.0)
Hemoglobin: 14.7 g/dL (ref 12.0–15.0)
Immature Granulocytes: 1 %
Lymphocytes Relative: 16 %
Lymphs Abs: 1.1 10*3/uL (ref 0.7–4.0)
MCH: 31.1 pg (ref 26.0–34.0)
MCHC: 33.6 g/dL (ref 30.0–36.0)
MCV: 92.6 fL (ref 80.0–100.0)
Monocytes Absolute: 0.4 10*3/uL (ref 0.1–1.0)
Monocytes Relative: 5 %
Neutro Abs: 5.4 10*3/uL (ref 1.7–7.7)
Neutrophils Relative %: 74 %
Platelets: 169 10*3/uL (ref 150–400)
RBC: 4.73 MIL/uL (ref 3.87–5.11)
RDW: 13.5 % (ref 11.5–15.5)
WBC: 7.3 10*3/uL (ref 4.0–10.5)
nRBC: 0 % (ref 0.0–0.2)

## 2023-01-01 LAB — BASIC METABOLIC PANEL
Anion gap: 9 (ref 5–15)
BUN: 7 mg/dL (ref 6–20)
CO2: 28 mmol/L (ref 22–32)
Calcium: 8 mg/dL — ABNORMAL LOW (ref 8.9–10.3)
Chloride: 97 mmol/L — ABNORMAL LOW (ref 98–111)
Creatinine, Ser: 0.57 mg/dL (ref 0.44–1.00)
GFR, Estimated: >60 mL/min (ref >60)
Glucose, Bld: 283 mg/dL — ABNORMAL HIGH (ref 70–99)
Potassium: 3.5 mmol/L (ref 3.5–5.1)
Sodium: 134 mmol/L — ABNORMAL LOW (ref 135–145)

## 2023-01-01 LAB — C-REACTIVE PROTEIN: CRP: 4.1 mg/dL — ABNORMAL HIGH (ref <1.0)

## 2023-01-01 LAB — GLUCOSE, CAPILLARY
Glucose-Capillary: 248 mg/dL — ABNORMAL HIGH (ref 70–99)
Glucose-Capillary: 314 mg/dL — ABNORMAL HIGH (ref 70–99)

## 2023-01-01 LAB — MAGNESIUM: Magnesium: 1.8 mg/dL (ref 1.7–2.4)

## 2023-01-01 LAB — PROCALCITONIN: Procalcitonin: <0.1 ng/mL

## 2023-01-01 LAB — AMMONIA: Ammonia: 15 umol/L (ref 9–35)

## 2023-01-01 MED ORDER — ASPIRIN 81 MG PO CHEW
81.0000 mg | CHEWABLE_TABLET | Freq: Every day | ORAL | 0 refills | Status: AC
  Filled 2023-01-01: qty 30, 30d supply, fill #0

## 2023-01-01 MED ORDER — INSULIN PEN NEEDLE 32G X 4 MM MISC
0 refills | Status: DC
  Filled 2023-01-01: qty 100, 25d supply, fill #0

## 2023-01-01 MED ORDER — AMLODIPINE BESYLATE 10 MG PO TABS
10.0000 mg | ORAL_TABLET | Freq: Every day | ORAL | Status: DC
  Administered 2023-01-01: 10 mg via ORAL
  Filled 2023-01-01: qty 1

## 2023-01-01 MED ORDER — BLOOD GLUCOSE TEST VI STRP
1.0000 | ORAL_STRIP | Freq: Three times a day (TID) | 0 refills | Status: AC
  Filled 2023-01-01: qty 100, 30d supply, fill #0

## 2023-01-01 MED ORDER — LANCET DEVICE MISC
1.0000 | Freq: Three times a day (TID) | 0 refills | Status: DC
  Filled 2023-01-01: qty 1, 30d supply, fill #0

## 2023-01-01 MED ORDER — INSULIN GLARGINE-YFGN 100 UNIT/ML ~~LOC~~ SOLN
55.0000 [IU] | Freq: Every day | SUBCUTANEOUS | Status: DC
  Administered 2023-01-01: 55 [IU] via SUBCUTANEOUS
  Filled 2023-01-01: qty 0.55

## 2023-01-01 MED ORDER — INSULIN LISPRO (1 UNIT DIAL) 100 UNIT/ML (KWIKPEN)
PEN_INJECTOR | SUBCUTANEOUS | 0 refills | Status: AC
  Filled 2023-01-01: qty 15, 50d supply, fill #0

## 2023-01-01 MED ORDER — INSULIN GLARGINE 100 UNIT/ML SOLOSTAR PEN
55.0000 [IU] | PEN_INJECTOR | Freq: Every day | SUBCUTANEOUS | 0 refills | Status: AC
  Filled 2023-01-01: qty 15, 27d supply, fill #0

## 2023-01-01 MED ORDER — BLOOD GLUCOSE MONITOR SYSTEM W/DEVICE KIT
1.0000 | PACK | Freq: Three times a day (TID) | 0 refills | Status: DC
  Filled 2023-01-01: qty 1, 30d supply, fill #0
  Filled 2023-01-01: qty 1, fill #0

## 2023-01-01 MED ORDER — AMLODIPINE BESYLATE 10 MG PO TABS
10.0000 mg | ORAL_TABLET | Freq: Every day | ORAL | 0 refills | Status: AC
  Filled 2023-01-01: qty 30, 30d supply, fill #0

## 2023-01-01 MED ORDER — ACCU-CHEK SOFTCLIX LANCETS MISC
1.0000 | Freq: Three times a day (TID) | 0 refills | Status: AC
  Filled 2023-01-01: qty 100, 30d supply, fill #0

## 2023-01-01 MED ORDER — CEPHALEXIN 500 MG PO CAPS
500.0000 mg | ORAL_CAPSULE | Freq: Three times a day (TID) | ORAL | 0 refills | Status: AC
  Filled 2023-01-01: qty 12, 4d supply, fill #0

## 2023-01-01 NOTE — Discharge Instructions (Signed)
Follow with Primary MD Shon Hale, MD in 7 days   Get CBC, CMP, 2 view Chest X ray -  checked next visit with your primary MD   Activity: As tolerated with Full fall precautions use walker/cane & assistance as needed  Disposition Home   Diet: Dysphagia 3 low carbohydrate diet with feeding assistance and aspiration precautions.  Accuchecks 4 times/day, Once in AM empty stomach and then before each meal. Log in all results and show them to your Prim.MD in 3 days. If any glucose reading is under 80 or above 300 call your Prim MD immidiately. Follow Low glucose instructions for glucose under 80 as instructed.   Special Instructions: If you have smoked or chewed Tobacco  in the last 2 yrs please stop smoking, stop any regular Alcohol  and or any Recreational drug use.  On your next visit with your primary care physician please Get Medicines reviewed and adjusted.  Please request your Prim.MD to go over all Hospital Tests and Procedure/Radiological results at the follow up, please get all Hospital records sent to your Prim MD by signing hospital release before you go home.  If you experience worsening of your admission symptoms, develop shortness of breath, life threatening emergency, suicidal or homicidal thoughts you must seek medical attention immediately by calling 911 or calling your MD immediately  if symptoms less severe.  You Must read complete instructions/literature along with all the possible adverse reactions/side effects for all the Medicines you take and that have been prescribed to you. Take any new Medicines after you have completely understood and accpet all the possible adverse reactions/side effects.

## 2023-01-01 NOTE — Plan of Care (Signed)
  Problem: Education: Goal: Ability to describe self-care measures that may prevent or decrease complications (Diabetes Survival Skills Education) will improve Outcome: Progressing Goal: Individualized Educational Video(s) Outcome: Progressing   Problem: Coping: Goal: Ability to adjust to condition or change in health will improve Outcome: Progressing   Problem: Fluid Volume: Goal: Ability to maintain a balanced intake and output will improve Outcome: Progressing   Problem: Health Behavior/Discharge Planning: Goal: Ability to identify and utilize available resources and services will improve Outcome: Progressing Goal: Ability to manage health-related needs will improve Outcome: Progressing   Problem: Metabolic: Goal: Ability to maintain appropriate glucose levels will improve Outcome: Progressing   Problem: Nutritional: Goal: Maintenance of adequate nutrition will improve Outcome: Progressing Goal: Progress toward achieving an optimal weight will improve Outcome: Progressing   Problem: Skin Integrity: Goal: Risk for impaired skin integrity will decrease Outcome: Progressing   Problem: Tissue Perfusion: Goal: Adequacy of tissue perfusion will improve Outcome: Progressing   Problem: Fluid Volume: Goal: Hemodynamic stability will improve Outcome: Progressing   Problem: Clinical Measurements: Goal: Diagnostic test results will improve Outcome: Progressing Goal: Signs and symptoms of infection will decrease Outcome: Progressing   Problem: Respiratory: Goal: Ability to maintain adequate ventilation will improve Outcome: Progressing   Problem: Education: Goal: Ability to describe self-care measures that may prevent or decrease complications (Diabetes Survival Skills Education) will improve Outcome: Progressing Goal: Individualized Educational Video(s) Outcome: Progressing   Problem: Cardiac: Goal: Ability to maintain an adequate cardiac output will  improve Outcome: Progressing   Problem: Health Behavior/Discharge Planning: Goal: Ability to identify and utilize available resources and services will improve Outcome: Progressing Goal: Ability to manage health-related needs will improve Outcome: Progressing   Problem: Fluid Volume: Goal: Ability to achieve a balanced intake and output will improve Outcome: Progressing   Problem: Metabolic: Goal: Ability to maintain appropriate glucose levels will improve Outcome: Progressing   Problem: Nutritional: Goal: Maintenance of adequate nutrition will improve Outcome: Progressing Goal: Maintenance of adequate weight for body size and type will improve Outcome: Progressing   Problem: Respiratory: Goal: Will regain and/or maintain adequate ventilation Outcome: Progressing   Problem: Urinary Elimination: Goal: Ability to achieve and maintain adequate renal perfusion and functioning will improve Outcome: Progressing   Problem: Education: Goal: Knowledge of General Education information will improve Description: Including pain rating scale, medication(s)/side effects and non-pharmacologic comfort measures Outcome: Progressing   Problem: Health Behavior/Discharge Planning: Goal: Ability to manage health-related needs will improve Outcome: Progressing   Problem: Clinical Measurements: Goal: Ability to maintain clinical measurements within normal limits will improve Outcome: Progressing Goal: Will remain free from infection Outcome: Progressing Goal: Diagnostic test results will improve Outcome: Progressing Goal: Respiratory complications will improve Outcome: Progressing Goal: Cardiovascular complication will be avoided Outcome: Progressing   Problem: Activity: Goal: Risk for activity intolerance will decrease Outcome: Progressing   Problem: Nutrition: Goal: Adequate nutrition will be maintained Outcome: Progressing   Problem: Coping: Goal: Level of anxiety will  decrease Outcome: Progressing   Problem: Elimination: Goal: Will not experience complications related to bowel motility Outcome: Progressing Goal: Will not experience complications related to urinary retention Outcome: Progressing   Problem: Pain Managment: Goal: General experience of comfort will improve Outcome: Progressing   Problem: Safety: Goal: Ability to remain free from injury will improve Outcome: Progressing   Problem: Skin Integrity: Goal: Risk for impaired skin integrity will decrease Outcome: Progressing   

## 2023-01-01 NOTE — Discharge Summary (Signed)
Brandy Mckinney ZOX:[id number] DOB: 12/05/1965 DOA: 12/29/2022  PCP: Shon Hale, MD  Admit date: 12/29/2022  Discharge date: 01/01/2023  Admitted From: Home  Disposition:  Home   Recommendations for Outpatient Follow-up:   Follow up with PCP in 1-2 weeks  PCP Please obtain BMP/CBC, 2 view CXR in 1week,  (see Discharge instructions)   PCP Please follow up on the following pending results:    Home Health: PT, RN, speech therapy Equipment/Devices: as below  Consultations: None  Discharge Condition: Stable    CODE STATUS: Full    Diet Recommendation: Dysphagia 3 low carbohydrate diet.  Diet Order             DIET DYS 3 Room service appropriate? Yes; Fluid consistency: Thin  Diet effective now                    Chief Complaint  Patient presents with   Altered Mental Status     Brief history of present illness from the day of admission and additional interim summary    57 y.o. female with medical history significant for hypertension, hyperlipidemia, depression, multiple sclerosis, and Alzheimer dementia, now presenting to the emergency department with confusion, lethargy, loss of appetite, polyuria, and polydipsia.  In the hospital she was diagnosed with severe metabolic encephalopathy due to nonketotic hyperosmolar state, severe dehydration.  And admitted to the hospital.                                                                  Hospital Course   DM type II new diagnosis with nonketotic hyperosmolar state, severe dehydration and metabolic encephalopathy.  She has been started on Endo tool with insulin drip, adequately hydrated, mentation improving, no headache or focal deficits, head CT nonacute, started on Semglee and sliding scale, dose adjusted further on 01/01/2023, diabetic and  insulin education to patient and her brother, plan discussed with the brother.  Mentation much improved.  Will be discharged today on long and short acting insulin along with diabetic testing supplies, follow-up with PCP within a week.  Lab Results  Component Value Date   HGBA1C 10.0 (H) 12/30/2022   CBG (last 3)  Recent Labs    12/31/22 1520 12/31/22 2009 01/01/23 0819  GLUCAP 207* 186* 248*    Sepsis due to UTI.  On Rocephin.  Sepsis pathophysiology resolved.  Bonded well to IV Rocephin transition to oral Keflex for 3 more days.   History of seizures.  No acute issues EEG stable, continue home Keppra.   Hypertension.  Placed on low-dose ACE inhibitor, Norvasc dose adjusted for better control.  PCP to monitor.   Dyslipidemia -  on statin.   History of Alzheimer's dementia and MS - supportive Rx, PT- OT, SLP.  Ordered for home if  she qualifies.   Discharge diagnosis     Principal Problem:   Sepsis secondary to UTI [facility]) Active Problems:   Multiple sclerosis (HCC)   Seizure (HCC)   Recurrent major depression in full remission (HCC)   Acute encephalopathy   Hyperosmolar hyperglycemic state (HHS) (HCC)   Early onset Alzheimer's dementia [facility])    Discharge instructions    Discharge Instructions     Discharge instructions   Complete by: As directed    Follow with Primary MD Shon Hale, MD in 7 days   Get CBC, CMP, 2 view Chest X ray -  checked next visit with your primary MD   Activity: As tolerated with Full fall precautions use walker/cane & assistance as needed  Disposition Home   Diet: Dysphagia 3 low carbohydrate diet with feeding assistance and aspiration precautions.  Accuchecks 4 times/day, Once in AM empty stomach and then before each meal. Log in all results and show them to your Prim.MD in 3 days. If any glucose reading is under 80 or above 300 call your Prim MD immidiately. Follow Low glucose instructions for glucose under 80 as  instructed.   Special Instructions: If you have smoked or chewed Tobacco  in the last 2 yrs please stop smoking, stop any regular Alcohol  and or any Recreational drug use.  On your next visit with your primary care physician please Get Medicines reviewed and adjusted.  Please request your Prim.MD to go over all Hospital Tests and Procedure/Radiological results at the follow up, please get all Hospital records sent to your Prim MD by signing hospital release before you go home.  If you experience worsening of your admission symptoms, develop shortness of breath, life threatening emergency, suicidal or homicidal thoughts you must seek medical attention immediately by calling 911 or calling your MD immediately  if symptoms less severe.  You Must read complete instructions/literature along with all the possible adverse reactions/side effects for all the Medicines you take and that have been prescribed to you. Take any new Medicines after you have completely understood and accpet all the possible adverse reactions/side effects.   Increase activity slowly   Complete by: As directed        Discharge Medications   Allergies as of 01/01/2023       Reactions   Fentanyl Other (See Comments)    can not have anything on her skin        Medication List     TAKE these medications    amLODipine 10 MG tablet Commonly known as: NORVASC Take 1 tablet (10 mg total) by mouth daily. What changed:  medication strength how much to take Another medication with the same name was removed. Continue taking this medication, and follow the directions you see here.   aspirin 81 MG chewable tablet Chew 1 tablet (81 mg total) by mouth daily. Start taking on: January 02, 2023   Blood Glucose Monitoring Suppl Devi 1 each by Does not apply route in the morning, at noon, and at bedtime. May substitute to any manufacturer covered by patient's insurance.   BLOOD GLUCOSE TEST STRIPS Strp 1 each by In Vitro route  in the morning, at noon, and at bedtime. May substitute to any manufacturer covered by patient's insurance.   cephALEXin 500 MG capsule Commonly known as: KEFLEX Take 1 capsule (500 mg total) by mouth 3 (three) times daily for 4 days.   Cranberry 500 MG Tabs Take 500 mg by mouth daily.   cyclobenzaprine  10 MG tablet Commonly known as: FLEXERIL Take 10 mg by mouth at bedtime.   etodolac 400 MG tablet Commonly known as: LODINE Take 1 tablet (400 mg total) by mouth 2 (two) times daily as needed.   FIBER CHOICE PO Take 1 tablet by mouth in the morning.   FLUoxetine 20 MG capsule Commonly known as: PROZAC Take 20 mg by mouth in the morning.   gabapentin 300 MG capsule Commonly known as: NEURONTIN Take 1 capsule (300 mg total) by mouth 3 (three) times daily.   insulin aspart 100 UNIT/ML FlexPen Commonly known as: NOVOLOG Before each meal 3 times a day, 140-199 - 2 units, 200-250 - 6 units, 251-299 - 8 units,  300-349 - 10 units,  350 or above 14 units.   insulin glargine 100 UNIT/ML Solostar Pen Commonly known as: LANTUS Inject 55 Units into the skin daily.   Insulin Syringe-Needle U-100 25G X 1" 1 ML Misc For 4 times a day insulin SQ, 1 month supply. Diagnosis E11.65   Lancet Device Misc 1 each by Does not apply route in the morning, at noon, and at bedtime. May substitute to any manufacturer covered by patient's insurance.   Lancets Misc. Misc 1 each by Does not apply route in the morning, at noon, and at bedtime. May substitute to any manufacturer covered by patient's insurance.   levETIRAcetam 1000 MG tablet Commonly known as: KEPPRA TAKE 1 TABLET BY MOUTH TWICE  DAILY   Lubricant Eye Drops 0.4-0.3 % Soln Generic drug: Polyethyl Glycol-Propyl Glycol Place 1-2 drops into both eyes 3 (three) times daily as needed (dry/irritated eyes.).   multivitamin with minerals Tabs tablet Take 1 tablet by mouth in the morning.   omeprazole 20 MG capsule Commonly known as:  PRILOSEC Take 20 mg by mouth daily before breakfast.   simvastatin 20 MG tablet Commonly known as: ZOCOR Take 20 mg by mouth every evening.   triamcinolone cream 0.1 % Commonly known as: KENALOG Apply 1 Application topically daily. After bathing   vitamin C 1000 MG tablet Take 1,000 mg by mouth in the morning.   Vitamin D3 50 MCG (2000 UT) Tabs Take 2,000 Units by mouth daily.               Durable Medical Equipment  (From admission, onward)           Start     Ordered   01/01/23 1026  For home use only DME Walker rolling  Once       Comments: 5 wheel  Question Answer Comment  Walker: With 5 Inch Wheels   Patient needs a walker to treat with the following condition Weakness      01/01/23 1025             Follow-up Information     Shon Hale, MD. Schedule an appointment as soon as possible for a visit in 1 week(s).   Specialty: Family Medicine Contact information: [address] [place] [address] [phone]                 Major procedures and Radiology Reports - PLEASE review detailed and final reports thoroughly  -      EEG adult  Result Date: 12/30/2022 Charlsie Quest, MD     12/30/2022  5:19 PM Patient Name: Brandy Mckinney MRN: [id number] Epilepsy Attending: Charlsie Quest Referring Physician/Provider: Leroy Sea, MD Date: 12/30/2022 Duration: 23.21 mins Patient history: 57yo M with ams getting eeg to  evaluate for seizure Level of alertness: Awake AEDs during EEG study: GBP, LEV Technical aspects: This EEG study was done with scalp electrodes positioned according to the 10-20 International system of electrode placement. Electrical activity was reviewed with band pass filter of 1-70Hz , sensitivity of 7 uV/mm, display speed of 72mm/sec with a 60Hz  notched filter applied as appropriate. EEG data were recorded continuously and digitally stored.  Video monitoring was available and reviewed as appropriate.  Description: The posterior dominant rhythm consists of 9 Hz activity of moderate voltage (25-35 uV) seen predominantly in posterior head regions, symmetric and reactive to eye opening and eye closing. Hyperventilation and photic stimulation were not performed.   IMPRESSION: This study is within normal limits. No seizures or epileptiform discharges were seen throughout the recording. A normal interictal EEG does not exclude the diagnosis of epilepsy. Charlsie Quest   DG Chest Port 1 View  Result Date: 12/30/2022 CLINICAL DATA:  Shortness of breath. EXAM: PORTABLE CHEST 1 VIEW COMPARISON:  December 29, 2022. FINDINGS: The heart size and mediastinal contours are within normal limits. Both lungs are clear. The visualized skeletal structures are unremarkable. IMPRESSION: No active disease. Electronically Signed   By: Lupita Raider M.D.   On: 12/30/2022 10:05   CT ABDOMEN PELVIS W CONTRAST  Result Date: 12/29/2022 CLINICAL DATA:  Sepsis EXAM: CT ABDOMEN AND PELVIS WITH CONTRAST TECHNIQUE: Multidetector CT imaging of the abdomen and pelvis was performed using the standard protocol following bolus administration of intravenous contrast. RADIATION DOSE REDUCTION: This exam was performed according to the departmental dose-optimization program which includes automated exposure control, adjustment of the mA and/or kV according to patient size and/or use of iterative reconstruction technique. CONTRAST:  75mL OMNIPAQUE IOHEXOL 350 MG/ML SOLN COMPARISON:  Chest CT 06/15/2021 FINDINGS: Lower chest: There is atelectasis in the lung bases. Hepatobiliary: The liver is enlarged and there is diffuse fatty infiltration. Gallstones are present. No biliary ductal dilatation identified. Pancreas: Unremarkable. No pancreatic ductal dilatation or surrounding inflammatory changes. Spleen: Normal in size without focal abnormality. Adrenals/Urinary Tract: There is a heterogeneous nodule in the left adrenal gland measuring 2.2 x 3.4 by 1.9  cm containing some focal areas of macroscopic fat. The right adrenal gland and bilateral kidneys are within normal limits. The bladder is within normal limits. Stomach/Bowel: Stomach is within normal limits. Appendix appears normal. No evidence of bowel wall thickening, distention, or inflammatory changes. There is sigmoid colon diverticulosis. There is a large amount of stool throughout the colon. The rectum is dilated and stool-filled measuring 8.5 cm in transverse dimension. Vascular/Lymphatic: Aortic atherosclerosis. No enlarged abdominal or pelvic lymph nodes. Reproductive: Status post hysterectomy. No adnexal masses. Other: No abdominal wall hernia or abnormality. No abdominopelvic ascites. Musculoskeletal: No acute or significant osseous findings. IMPRESSION: 1. No acute localizing process in the abdomen or pelvis. 2. Large amount of stool throughout the colon. The rectum is dilated and stool-filled measuring 8.5 cm in transverse dimension. 3. Colonic diverticulosis without evidence for diverticulitis. 4. Cholelithiasis. 5. Fatty infiltration of the liver with hepatomegaly. 6. 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 Aortic Atherosclerosis (ICD10-I70.0). Electronically Signed   By: Darliss Cheney M.D.   On: 12/29/2022 22:38   CT Head Wo Contrast  Result Date: 12/29/2022 CLINICAL DATA:  Mental status change, unknown cause EXAM: CT HEAD WITHOUT CONTRAST TECHNIQUE: Contiguous axial images were obtained from the base of the skull through the vertex without intravenous contrast. RADIATION DOSE REDUCTION: This exam  was performed according to the departmental dose-optimization program which includes automated exposure control, adjustment of the mA and/or kV according to patient size and/or use of iterative reconstruction technique. COMPARISON:  10/27/2022 FINDINGS: Brain: Severe diffuse atrophy, most advanced in the frontal lobes. Advanced chronic small  vessel disease throughout the deep white matter. No acute intracranial abnormality. Specifically, no hemorrhage, hydrocephalus, mass lesion, acute infarction, or significant intracranial injury. Vascular: No hyperdense vessel or unexpected calcification. Skull: No acute calvarial abnormality. Sinuses/Orbits: No acute findings Other: None IMPRESSION: Severe atrophy, chronic small vessel disease, stable. No acute intracranial abnormality. Electronically Signed   By: Charlett Nose M.D.   On: 12/29/2022 22:35   DG Chest Portable 1 View  Result Date: 12/29/2022 CLINICAL DATA:  Altered mental status EXAM: PORTABLE CHEST 1 VIEW COMPARISON:  Chest radiograph and CT 06/15/2021 FINDINGS: Stable cardiomediastinal silhouette given patient rotation. Left basilar atelectasis. Otherwise no focal consolidation. No pleural effusion or pneumothorax. No displaced rib fractures. IMPRESSION: No active disease. Electronically Signed   By: Minerva Fester M.D.   On: 12/29/2022 19:50    Micro Results    Recent Results (from the past 240 hour(s))  Culture, blood (Routine x 2)     Status: None (Preliminary result)   Collection Time: 12/29/22  7:03 PM   Specimen: BLOOD RIGHT HAND  Result Value Ref Range Status   Specimen Description BLOOD RIGHT HAND  Final   Special Requests   Final    BOTTLES DRAWN AEROBIC AND ANAEROBIC Blood Culture adequate volume   Culture   Final    NO GROWTH 3 DAYS Performed at [facility], [address] [place], [place] [address]    Report Status PENDING  Incomplete  Culture, blood (Routine x 2)     Status: None (Preliminary result)   Collection Time: 12/29/22  9:00 PM   Specimen: BLOOD  Result Value Ref Range Status   Specimen Description BLOOD SITE NOT SPECIFIED  Final   Special Requests   Final    BOTTLES DRAWN AEROBIC AND ANAEROBIC Blood Culture adequate volume   Culture   Final    NO GROWTH 3 DAYS Performed at [facility], [address], [place], [place] [address]     Report Status PENDING  Incomplete    Today   Subjective    Brandy Mckinney today has no headache,no chest abdominal pain,no new weakness tingling or numbness, feels much better wants to go home today.    Objective   Blood pressure (!) 150/98, pulse 88, temperature 98.6 F (37 C), temperature source Oral, resp. rate 20, height 5\' 2"  (1.575 m), weight 81 kg, SpO2 96 %.   Intake/Output Summary (Last 24 hours) at 01/01/2023 1028 Last data filed at 01/01/2023 1308 Gross per 24 hour  Intake --  Output 2525 ml  Net -2525 ml    Exam  Awake Alert, No new F.N deficits,    [place].AT,PERRAL Supple Neck,   Symmetrical Chest wall movement, Good air movement bilaterally, CTAB RRR,No Gallops,   +ve B.Sounds, Abd Soft, Non tender,  No Cyanosis, Clubbing or edema    Data Review   Recent Labs  Lab 12/29/22 1901 12/29/22 1911 12/29/22 1919 12/30/22 0613 12/31/22 0628 01/01/23 0218  WBC 8.2  --   --  7.0 6.9 7.3  HGB 18.7* 19.4* 19.0* 16.1* 14.6 14.7  HCT 57.4* 57.0* 56.0* 50.4* 44.7 43.8  PLT 274  --   --  192 159 169  MCV 96.3  --   --  97.3 94.3 92.6  MCH 31.4  --   --  31.1 30.8 31.1  MCHC 32.6  --   --  31.9 32.7 33.6  RDW 14.6  --   --  14.5 13.8 13.5  LYMPHSABS 1.5  --   --   --  1.2 1.1  MONOABS 0.7  --   --   --  0.4 0.4  EOSABS 0.1  --   --   --  0.3 0.3  BASOSABS 0.1  --   --   --  0.0 0.0    Recent Labs  Lab 12/29/22 1901 12/29/22 1911 12/29/22 1919 12/29/22 2100 12/30/22 0110 12/30/22 0613 12/31/22 0628 01/01/23 0218  NA 145 147* 146*  --  145  --  138 134*  K 4.1 4.1 4.1  --  3.4*  --  3.7 3.5  CL 105 108  --   --  108  --  103 97*  CO2 24  --   --   --  26  --  26 28  ANIONGAP 16*  --   --   --  11  --  9 9  GLUCOSE 612* 639*  --   --  304*  --  305* 283*  BUN 14 14  --   --  10  --  9 7  CREATININE 0.75 0.60  --   --  0.56  --  0.44 0.57  AST 37  --   --   --   --   --   --   --   ALT 65*  --   --   --   --   --   --   --   ALKPHOS 148*  --   --    --   --   --   --   --   BILITOT 1.2  --   --   --   --   --   --   --   ALBUMIN 4.0  --   --   --   --   --   --   --   CRP  --   --   --   --   --  0.9 1.4* 4.1*  PROCALCITON  --   --   --   --   --  <0.10 <0.10 <0.10  LATICACIDVEN 3.2*  --   --  4.0* 1.9  --   --   --   INR 1.0  --   --   --   --   --   --   --   HGBA1C  --   --   --   --  10.0*  --   --   --   AMMONIA  --   --   --   --   --  38* 30 15  BNP  --   --   --   --   --  10.3  --   --   MG  --   --   --   --  1.8 1.8 1.7 1.8  CALCIUM 10.5*  --   --   --  9.0  --  8.3* 8.0*    Total Time in preparing paper work, data evaluation and todays exam - 35 minutes  Signature  -    Susa Raring M.D on 01/01/2023 at 10:28 AM   -  To page go to [email or web address]

## 2023-01-01 NOTE — TOC Transition Note (Addendum)
Transition of Care [facility]) - CM/SW Discharge Note   Patient Details  Name: Brandy Mckinney MRN: [id number] Date of Birth: [birth year]  Transition of Care [facility]) CM/SW Contact:  Lawerance Sabal, RN Phone Number: 01/01/2023, 11:44 AM   Clinical Narrative:     Sherron Monday w patient's brother Brandy Mckinney who identifies himself as [name].  Requested hospital bed, verified address. Ordered, [employer] will deliver.  Refused RW  Home health set up through [place], no preference from [name].  Brother will provide transportation home   Final next level of care: Home w Home Health Services Barriers to Discharge: No Barriers Identified   Patient Goals and CMS Choice CMS Medicare.gov Compare Post Acute Care list provided to:: Other (Comment Required) Choice offered to / list presented to : [facility] [name] / Guardian  Discharge Placement                         Discharge Plan and Services Additional resources added to the After Visit Summary for                  DME Arranged: Hospital bed DME Agency: [employer] Date DME Agency Contacted: 01/01/23 Time DME Agency Contacted: 1143 Representative spoke with at DME Agency: Vaughan Basta HH Arranged: PT, RN, Speech Therapy HH Agency: [facility] Date [facility] Agency Contacted: 01/01/23 Time HH Agency Contacted: 1144 Representative spoke with at [facility] Agency: Kandee Keen  Social Determinants of Health (SDOH) Interventions SDOH Screenings   Housing: Patient Unable To Answer (12/30/2022)  Tobacco Use: Medium Risk (12/29/2022)     Readmission Risk Interventions     No data to display

## 2023-01-03 LAB — CULTURE, BLOOD (ROUTINE X 2)
Culture: NO GROWTH
Culture: NO GROWTH
Special Requests: ADEQUATE
Special Requests: ADEQUATE

## 2023-01-04 DIAGNOSIS — E1165 Type 2 diabetes mellitus with hyperglycemia: Secondary | ICD-10-CM | POA: Diagnosis not present

## 2023-01-04 DIAGNOSIS — Z794 Long term (current) use of insulin: Secondary | ICD-10-CM | POA: Diagnosis not present

## 2023-01-07 DIAGNOSIS — Z87891 Personal history of nicotine dependence: Secondary | ICD-10-CM | POA: Diagnosis not present

## 2023-01-07 DIAGNOSIS — F02818 Dementia in other diseases classified elsewhere, unspecified severity, with other behavioral disturbance: Secondary | ICD-10-CM | POA: Diagnosis not present

## 2023-01-07 DIAGNOSIS — N390 Urinary tract infection, site not specified: Secondary | ICD-10-CM | POA: Diagnosis not present

## 2023-01-07 DIAGNOSIS — Z9181 History of falling: Secondary | ICD-10-CM | POA: Diagnosis not present

## 2023-01-07 DIAGNOSIS — G300 Alzheimer's disease with early onset: Secondary | ICD-10-CM | POA: Diagnosis not present

## 2023-01-07 DIAGNOSIS — I700 Atherosclerosis of aorta: Secondary | ICD-10-CM | POA: Diagnosis not present

## 2023-01-07 DIAGNOSIS — E860 Dehydration: Secondary | ICD-10-CM | POA: Diagnosis not present

## 2023-01-07 DIAGNOSIS — I10 Essential (primary) hypertension: Secondary | ICD-10-CM | POA: Diagnosis not present

## 2023-01-07 DIAGNOSIS — K5730 Diverticulosis of large intestine without perforation or abscess without bleeding: Secondary | ICD-10-CM | POA: Diagnosis not present

## 2023-01-07 DIAGNOSIS — Z794 Long term (current) use of insulin: Secondary | ICD-10-CM | POA: Diagnosis not present

## 2023-01-07 DIAGNOSIS — G35 Multiple sclerosis: Secondary | ICD-10-CM | POA: Diagnosis not present

## 2023-01-07 DIAGNOSIS — E7800 Pure hypercholesterolemia, unspecified: Secondary | ICD-10-CM | POA: Diagnosis not present

## 2023-01-07 DIAGNOSIS — E1100 Type 2 diabetes mellitus with hyperosmolarity without nonketotic hyperglycemic-hyperosmolar coma (NKHHC): Secondary | ICD-10-CM | POA: Diagnosis not present

## 2023-01-07 DIAGNOSIS — E559 Vitamin D deficiency, unspecified: Secondary | ICD-10-CM | POA: Diagnosis not present

## 2023-01-07 DIAGNOSIS — F0283 Dementia in other diseases classified elsewhere, unspecified severity, with mood disturbance: Secondary | ICD-10-CM | POA: Diagnosis not present

## 2023-01-07 DIAGNOSIS — Z7984 Long term (current) use of oral hypoglycemic drugs: Secondary | ICD-10-CM | POA: Diagnosis not present

## 2023-01-11 DIAGNOSIS — G300 Alzheimer's disease with early onset: Secondary | ICD-10-CM | POA: Diagnosis not present

## 2023-01-11 DIAGNOSIS — Z7984 Long term (current) use of oral hypoglycemic drugs: Secondary | ICD-10-CM | POA: Diagnosis not present

## 2023-01-11 DIAGNOSIS — E860 Dehydration: Secondary | ICD-10-CM | POA: Diagnosis not present

## 2023-01-11 DIAGNOSIS — E559 Vitamin D deficiency, unspecified: Secondary | ICD-10-CM | POA: Diagnosis not present

## 2023-01-11 DIAGNOSIS — Z794 Long term (current) use of insulin: Secondary | ICD-10-CM | POA: Diagnosis not present

## 2023-01-11 DIAGNOSIS — I700 Atherosclerosis of aorta: Secondary | ICD-10-CM | POA: Diagnosis not present

## 2023-01-11 DIAGNOSIS — F02818 Dementia in other diseases classified elsewhere, unspecified severity, with other behavioral disturbance: Secondary | ICD-10-CM | POA: Diagnosis not present

## 2023-01-11 DIAGNOSIS — Z9181 History of falling: Secondary | ICD-10-CM | POA: Diagnosis not present

## 2023-01-11 DIAGNOSIS — Z87891 Personal history of nicotine dependence: Secondary | ICD-10-CM | POA: Diagnosis not present

## 2023-01-11 DIAGNOSIS — G35 Multiple sclerosis: Secondary | ICD-10-CM | POA: Diagnosis not present

## 2023-01-11 DIAGNOSIS — F0283 Dementia in other diseases classified elsewhere, unspecified severity, with mood disturbance: Secondary | ICD-10-CM | POA: Diagnosis not present

## 2023-01-11 DIAGNOSIS — N390 Urinary tract infection, site not specified: Secondary | ICD-10-CM | POA: Diagnosis not present

## 2023-01-11 DIAGNOSIS — E7800 Pure hypercholesterolemia, unspecified: Secondary | ICD-10-CM | POA: Diagnosis not present

## 2023-01-11 DIAGNOSIS — K5730 Diverticulosis of large intestine without perforation or abscess without bleeding: Secondary | ICD-10-CM | POA: Diagnosis not present

## 2023-01-11 DIAGNOSIS — E1100 Type 2 diabetes mellitus with hyperosmolarity without nonketotic hyperglycemic-hyperosmolar coma (NKHHC): Secondary | ICD-10-CM | POA: Diagnosis not present

## 2023-01-11 DIAGNOSIS — I10 Essential (primary) hypertension: Secondary | ICD-10-CM | POA: Diagnosis not present

## 2023-01-12 DIAGNOSIS — Z794 Long term (current) use of insulin: Secondary | ICD-10-CM | POA: Diagnosis not present

## 2023-01-12 DIAGNOSIS — Z87891 Personal history of nicotine dependence: Secondary | ICD-10-CM | POA: Diagnosis not present

## 2023-01-12 DIAGNOSIS — G35 Multiple sclerosis: Secondary | ICD-10-CM | POA: Diagnosis not present

## 2023-01-12 DIAGNOSIS — F02818 Dementia in other diseases classified elsewhere, unspecified severity, with other behavioral disturbance: Secondary | ICD-10-CM | POA: Diagnosis not present

## 2023-01-12 DIAGNOSIS — K5730 Diverticulosis of large intestine without perforation or abscess without bleeding: Secondary | ICD-10-CM | POA: Diagnosis not present

## 2023-01-12 DIAGNOSIS — E860 Dehydration: Secondary | ICD-10-CM | POA: Diagnosis not present

## 2023-01-12 DIAGNOSIS — E7800 Pure hypercholesterolemia, unspecified: Secondary | ICD-10-CM | POA: Diagnosis not present

## 2023-01-12 DIAGNOSIS — E559 Vitamin D deficiency, unspecified: Secondary | ICD-10-CM | POA: Diagnosis not present

## 2023-01-12 DIAGNOSIS — F0283 Dementia in other diseases classified elsewhere, unspecified severity, with mood disturbance: Secondary | ICD-10-CM | POA: Diagnosis not present

## 2023-01-12 DIAGNOSIS — N390 Urinary tract infection, site not specified: Secondary | ICD-10-CM | POA: Diagnosis not present

## 2023-01-12 DIAGNOSIS — Z7984 Long term (current) use of oral hypoglycemic drugs: Secondary | ICD-10-CM | POA: Diagnosis not present

## 2023-01-12 DIAGNOSIS — I10 Essential (primary) hypertension: Secondary | ICD-10-CM | POA: Diagnosis not present

## 2023-01-12 DIAGNOSIS — E1100 Type 2 diabetes mellitus with hyperosmolarity without nonketotic hyperglycemic-hyperosmolar coma (NKHHC): Secondary | ICD-10-CM | POA: Diagnosis not present

## 2023-01-12 DIAGNOSIS — Z9181 History of falling: Secondary | ICD-10-CM | POA: Diagnosis not present

## 2023-01-12 DIAGNOSIS — I700 Atherosclerosis of aorta: Secondary | ICD-10-CM | POA: Diagnosis not present

## 2023-01-12 DIAGNOSIS — G300 Alzheimer's disease with early onset: Secondary | ICD-10-CM | POA: Diagnosis not present

## 2023-01-13 ENCOUNTER — Ambulatory Visit
Admission: RE | Admit: 2023-01-13 | Discharge: 2023-01-13 | Disposition: A | Discharge status: Home / Self Care | Payer: 59 | Source: Ambulatory Visit | Attending: Family Medicine | Admitting: Family Medicine

## 2023-01-13 DIAGNOSIS — R928 Other abnormal and inconclusive findings on diagnostic imaging of breast: Secondary | ICD-10-CM

## 2023-01-14 ENCOUNTER — Other Ambulatory Visit: Payer: Self-pay | Admitting: Family Medicine

## 2023-01-14 DIAGNOSIS — N390 Urinary tract infection, site not specified: Secondary | ICD-10-CM | POA: Diagnosis not present

## 2023-01-14 DIAGNOSIS — I10 Essential (primary) hypertension: Secondary | ICD-10-CM | POA: Diagnosis not present

## 2023-01-14 DIAGNOSIS — E559 Vitamin D deficiency, unspecified: Secondary | ICD-10-CM | POA: Diagnosis not present

## 2023-01-14 DIAGNOSIS — Z87891 Personal history of nicotine dependence: Secondary | ICD-10-CM | POA: Diagnosis not present

## 2023-01-14 DIAGNOSIS — K5730 Diverticulosis of large intestine without perforation or abscess without bleeding: Secondary | ICD-10-CM | POA: Diagnosis not present

## 2023-01-14 DIAGNOSIS — E860 Dehydration: Secondary | ICD-10-CM | POA: Diagnosis not present

## 2023-01-14 DIAGNOSIS — Z794 Long term (current) use of insulin: Secondary | ICD-10-CM | POA: Diagnosis not present

## 2023-01-14 DIAGNOSIS — F02818 Dementia in other diseases classified elsewhere, unspecified severity, with other behavioral disturbance: Secondary | ICD-10-CM | POA: Diagnosis not present

## 2023-01-14 DIAGNOSIS — N6320 Unspecified lump in the left breast, unspecified quadrant: Secondary | ICD-10-CM

## 2023-01-14 DIAGNOSIS — Z1239 Encounter for other screening for malignant neoplasm of breast: Secondary | ICD-10-CM

## 2023-01-14 DIAGNOSIS — I700 Atherosclerosis of aorta: Secondary | ICD-10-CM | POA: Diagnosis not present

## 2023-01-14 DIAGNOSIS — Z9181 History of falling: Secondary | ICD-10-CM | POA: Diagnosis not present

## 2023-01-14 DIAGNOSIS — E7800 Pure hypercholesterolemia, unspecified: Secondary | ICD-10-CM | POA: Diagnosis not present

## 2023-01-14 DIAGNOSIS — G300 Alzheimer's disease with early onset: Secondary | ICD-10-CM | POA: Diagnosis not present

## 2023-01-14 DIAGNOSIS — E1100 Type 2 diabetes mellitus with hyperosmolarity without nonketotic hyperglycemic-hyperosmolar coma (NKHHC): Secondary | ICD-10-CM | POA: Diagnosis not present

## 2023-01-14 DIAGNOSIS — G35 Multiple sclerosis: Secondary | ICD-10-CM | POA: Diagnosis not present

## 2023-01-14 DIAGNOSIS — F0283 Dementia in other diseases classified elsewhere, unspecified severity, with mood disturbance: Secondary | ICD-10-CM | POA: Diagnosis not present

## 2023-01-14 DIAGNOSIS — Z7984 Long term (current) use of oral hypoglycemic drugs: Secondary | ICD-10-CM | POA: Diagnosis not present

## 2023-01-18 DIAGNOSIS — G35 Multiple sclerosis: Secondary | ICD-10-CM | POA: Diagnosis not present

## 2023-01-18 DIAGNOSIS — Z87891 Personal history of nicotine dependence: Secondary | ICD-10-CM | POA: Diagnosis not present

## 2023-01-18 DIAGNOSIS — E559 Vitamin D deficiency, unspecified: Secondary | ICD-10-CM | POA: Diagnosis not present

## 2023-01-18 DIAGNOSIS — Z794 Long term (current) use of insulin: Secondary | ICD-10-CM | POA: Diagnosis not present

## 2023-01-18 DIAGNOSIS — I10 Essential (primary) hypertension: Secondary | ICD-10-CM | POA: Diagnosis not present

## 2023-01-18 DIAGNOSIS — G300 Alzheimer's disease with early onset: Secondary | ICD-10-CM | POA: Diagnosis not present

## 2023-01-18 DIAGNOSIS — I700 Atherosclerosis of aorta: Secondary | ICD-10-CM | POA: Diagnosis not present

## 2023-01-18 DIAGNOSIS — E7800 Pure hypercholesterolemia, unspecified: Secondary | ICD-10-CM | POA: Diagnosis not present

## 2023-01-18 DIAGNOSIS — E1100 Type 2 diabetes mellitus with hyperosmolarity without nonketotic hyperglycemic-hyperosmolar coma (NKHHC): Secondary | ICD-10-CM | POA: Diagnosis not present

## 2023-01-18 DIAGNOSIS — L22 Diaper dermatitis: Secondary | ICD-10-CM | POA: Diagnosis not present

## 2023-01-18 DIAGNOSIS — Z9181 History of falling: Secondary | ICD-10-CM | POA: Diagnosis not present

## 2023-01-18 DIAGNOSIS — N390 Urinary tract infection, site not specified: Secondary | ICD-10-CM | POA: Diagnosis not present

## 2023-01-18 DIAGNOSIS — F0283 Dementia in other diseases classified elsewhere, unspecified severity, with mood disturbance: Secondary | ICD-10-CM | POA: Diagnosis not present

## 2023-01-18 DIAGNOSIS — F02818 Dementia in other diseases classified elsewhere, unspecified severity, with other behavioral disturbance: Secondary | ICD-10-CM | POA: Diagnosis not present

## 2023-01-18 DIAGNOSIS — K5730 Diverticulosis of large intestine without perforation or abscess without bleeding: Secondary | ICD-10-CM | POA: Diagnosis not present

## 2023-01-18 DIAGNOSIS — Z7984 Long term (current) use of oral hypoglycemic drugs: Secondary | ICD-10-CM | POA: Diagnosis not present

## 2023-01-18 DIAGNOSIS — E860 Dehydration: Secondary | ICD-10-CM | POA: Diagnosis not present

## 2023-01-19 DIAGNOSIS — F02818 Dementia in other diseases classified elsewhere, unspecified severity, with other behavioral disturbance: Secondary | ICD-10-CM | POA: Diagnosis not present

## 2023-01-19 DIAGNOSIS — E860 Dehydration: Secondary | ICD-10-CM | POA: Diagnosis not present

## 2023-01-19 DIAGNOSIS — I700 Atherosclerosis of aorta: Secondary | ICD-10-CM | POA: Diagnosis not present

## 2023-01-19 DIAGNOSIS — G35 Multiple sclerosis: Secondary | ICD-10-CM | POA: Diagnosis not present

## 2023-01-19 DIAGNOSIS — Z9181 History of falling: Secondary | ICD-10-CM | POA: Diagnosis not present

## 2023-01-19 DIAGNOSIS — Z794 Long term (current) use of insulin: Secondary | ICD-10-CM | POA: Diagnosis not present

## 2023-01-19 DIAGNOSIS — E559 Vitamin D deficiency, unspecified: Secondary | ICD-10-CM | POA: Diagnosis not present

## 2023-01-19 DIAGNOSIS — Z87891 Personal history of nicotine dependence: Secondary | ICD-10-CM | POA: Diagnosis not present

## 2023-01-19 DIAGNOSIS — Z7984 Long term (current) use of oral hypoglycemic drugs: Secondary | ICD-10-CM | POA: Diagnosis not present

## 2023-01-19 DIAGNOSIS — F0283 Dementia in other diseases classified elsewhere, unspecified severity, with mood disturbance: Secondary | ICD-10-CM | POA: Diagnosis not present

## 2023-01-19 DIAGNOSIS — E7800 Pure hypercholesterolemia, unspecified: Secondary | ICD-10-CM | POA: Diagnosis not present

## 2023-01-19 DIAGNOSIS — E1100 Type 2 diabetes mellitus with hyperosmolarity without nonketotic hyperglycemic-hyperosmolar coma (NKHHC): Secondary | ICD-10-CM | POA: Diagnosis not present

## 2023-01-19 DIAGNOSIS — N390 Urinary tract infection, site not specified: Secondary | ICD-10-CM | POA: Diagnosis not present

## 2023-01-19 DIAGNOSIS — G300 Alzheimer's disease with early onset: Secondary | ICD-10-CM | POA: Diagnosis not present

## 2023-01-19 DIAGNOSIS — I10 Essential (primary) hypertension: Secondary | ICD-10-CM | POA: Diagnosis not present

## 2023-01-19 DIAGNOSIS — K5730 Diverticulosis of large intestine without perforation or abscess without bleeding: Secondary | ICD-10-CM | POA: Diagnosis not present

## 2023-01-25 DIAGNOSIS — G35 Multiple sclerosis: Secondary | ICD-10-CM | POA: Diagnosis not present

## 2023-01-25 DIAGNOSIS — I10 Essential (primary) hypertension: Secondary | ICD-10-CM | POA: Diagnosis not present

## 2023-01-25 DIAGNOSIS — Z7984 Long term (current) use of oral hypoglycemic drugs: Secondary | ICD-10-CM | POA: Diagnosis not present

## 2023-01-25 DIAGNOSIS — F0283 Dementia in other diseases classified elsewhere, unspecified severity, with mood disturbance: Secondary | ICD-10-CM | POA: Diagnosis not present

## 2023-01-25 DIAGNOSIS — E7800 Pure hypercholesterolemia, unspecified: Secondary | ICD-10-CM | POA: Diagnosis not present

## 2023-01-25 DIAGNOSIS — K5730 Diverticulosis of large intestine without perforation or abscess without bleeding: Secondary | ICD-10-CM | POA: Diagnosis not present

## 2023-01-25 DIAGNOSIS — Z87891 Personal history of nicotine dependence: Secondary | ICD-10-CM | POA: Diagnosis not present

## 2023-01-25 DIAGNOSIS — E559 Vitamin D deficiency, unspecified: Secondary | ICD-10-CM | POA: Diagnosis not present

## 2023-01-25 DIAGNOSIS — N390 Urinary tract infection, site not specified: Secondary | ICD-10-CM | POA: Diagnosis not present

## 2023-01-25 DIAGNOSIS — I700 Atherosclerosis of aorta: Secondary | ICD-10-CM | POA: Diagnosis not present

## 2023-01-25 DIAGNOSIS — Z9181 History of falling: Secondary | ICD-10-CM | POA: Diagnosis not present

## 2023-01-25 DIAGNOSIS — Z794 Long term (current) use of insulin: Secondary | ICD-10-CM | POA: Diagnosis not present

## 2023-01-25 DIAGNOSIS — E1100 Type 2 diabetes mellitus with hyperosmolarity without nonketotic hyperglycemic-hyperosmolar coma (NKHHC): Secondary | ICD-10-CM | POA: Diagnosis not present

## 2023-01-25 DIAGNOSIS — E860 Dehydration: Secondary | ICD-10-CM | POA: Diagnosis not present

## 2023-01-25 DIAGNOSIS — F02818 Dementia in other diseases classified elsewhere, unspecified severity, with other behavioral disturbance: Secondary | ICD-10-CM | POA: Diagnosis not present

## 2023-01-25 DIAGNOSIS — G300 Alzheimer's disease with early onset: Secondary | ICD-10-CM | POA: Diagnosis not present

## 2023-01-26 DIAGNOSIS — F02818 Dementia in other diseases classified elsewhere, unspecified severity, with other behavioral disturbance: Secondary | ICD-10-CM | POA: Diagnosis not present

## 2023-01-26 DIAGNOSIS — I700 Atherosclerosis of aorta: Secondary | ICD-10-CM | POA: Diagnosis not present

## 2023-01-26 DIAGNOSIS — E860 Dehydration: Secondary | ICD-10-CM | POA: Diagnosis not present

## 2023-01-26 DIAGNOSIS — N390 Urinary tract infection, site not specified: Secondary | ICD-10-CM | POA: Diagnosis not present

## 2023-01-26 DIAGNOSIS — Z7984 Long term (current) use of oral hypoglycemic drugs: Secondary | ICD-10-CM | POA: Diagnosis not present

## 2023-01-26 DIAGNOSIS — K5730 Diverticulosis of large intestine without perforation or abscess without bleeding: Secondary | ICD-10-CM | POA: Diagnosis not present

## 2023-01-26 DIAGNOSIS — E1100 Type 2 diabetes mellitus with hyperosmolarity without nonketotic hyperglycemic-hyperosmolar coma (NKHHC): Secondary | ICD-10-CM | POA: Diagnosis not present

## 2023-01-26 DIAGNOSIS — E559 Vitamin D deficiency, unspecified: Secondary | ICD-10-CM | POA: Diagnosis not present

## 2023-01-26 DIAGNOSIS — E7800 Pure hypercholesterolemia, unspecified: Secondary | ICD-10-CM | POA: Diagnosis not present

## 2023-01-26 DIAGNOSIS — Z794 Long term (current) use of insulin: Secondary | ICD-10-CM | POA: Diagnosis not present

## 2023-01-26 DIAGNOSIS — F0283 Dementia in other diseases classified elsewhere, unspecified severity, with mood disturbance: Secondary | ICD-10-CM | POA: Diagnosis not present

## 2023-01-26 DIAGNOSIS — G300 Alzheimer's disease with early onset: Secondary | ICD-10-CM | POA: Diagnosis not present

## 2023-01-26 DIAGNOSIS — Z87891 Personal history of nicotine dependence: Secondary | ICD-10-CM | POA: Diagnosis not present

## 2023-01-26 DIAGNOSIS — G35 Multiple sclerosis: Secondary | ICD-10-CM | POA: Diagnosis not present

## 2023-01-26 DIAGNOSIS — Z9181 History of falling: Secondary | ICD-10-CM | POA: Diagnosis not present

## 2023-01-26 DIAGNOSIS — I10 Essential (primary) hypertension: Secondary | ICD-10-CM | POA: Diagnosis not present

## 2023-01-31 ENCOUNTER — Other Ambulatory Visit: Payer: Self-pay | Admitting: Neurology

## 2023-02-01 ENCOUNTER — Other Ambulatory Visit: Payer: Self-pay

## 2023-02-01 DIAGNOSIS — F0283 Dementia in other diseases classified elsewhere, unspecified severity, with mood disturbance: Secondary | ICD-10-CM | POA: Diagnosis not present

## 2023-02-01 DIAGNOSIS — E860 Dehydration: Secondary | ICD-10-CM | POA: Diagnosis not present

## 2023-02-01 DIAGNOSIS — Z794 Long term (current) use of insulin: Secondary | ICD-10-CM | POA: Diagnosis not present

## 2023-02-01 DIAGNOSIS — I10 Essential (primary) hypertension: Secondary | ICD-10-CM | POA: Diagnosis not present

## 2023-02-01 DIAGNOSIS — F02818 Dementia in other diseases classified elsewhere, unspecified severity, with other behavioral disturbance: Secondary | ICD-10-CM | POA: Diagnosis not present

## 2023-02-01 DIAGNOSIS — G35 Multiple sclerosis: Secondary | ICD-10-CM | POA: Diagnosis not present

## 2023-02-01 DIAGNOSIS — E559 Vitamin D deficiency, unspecified: Secondary | ICD-10-CM | POA: Diagnosis not present

## 2023-02-01 DIAGNOSIS — Z7984 Long term (current) use of oral hypoglycemic drugs: Secondary | ICD-10-CM | POA: Diagnosis not present

## 2023-02-01 DIAGNOSIS — Z87891 Personal history of nicotine dependence: Secondary | ICD-10-CM | POA: Diagnosis not present

## 2023-02-01 DIAGNOSIS — G300 Alzheimer's disease with early onset: Secondary | ICD-10-CM | POA: Diagnosis not present

## 2023-02-01 DIAGNOSIS — I700 Atherosclerosis of aorta: Secondary | ICD-10-CM | POA: Diagnosis not present

## 2023-02-01 DIAGNOSIS — E7800 Pure hypercholesterolemia, unspecified: Secondary | ICD-10-CM | POA: Diagnosis not present

## 2023-02-01 DIAGNOSIS — K5730 Diverticulosis of large intestine without perforation or abscess without bleeding: Secondary | ICD-10-CM | POA: Diagnosis not present

## 2023-02-01 DIAGNOSIS — N390 Urinary tract infection, site not specified: Secondary | ICD-10-CM | POA: Diagnosis not present

## 2023-02-01 DIAGNOSIS — E1100 Type 2 diabetes mellitus with hyperosmolarity without nonketotic hyperglycemic-hyperosmolar coma (NKHHC): Secondary | ICD-10-CM | POA: Diagnosis not present

## 2023-02-01 DIAGNOSIS — Z9181 History of falling: Secondary | ICD-10-CM | POA: Diagnosis not present

## 2023-02-03 DIAGNOSIS — Z794 Long term (current) use of insulin: Secondary | ICD-10-CM | POA: Diagnosis not present

## 2023-02-03 DIAGNOSIS — I10 Essential (primary) hypertension: Secondary | ICD-10-CM | POA: Diagnosis not present

## 2023-02-03 DIAGNOSIS — I700 Atherosclerosis of aorta: Secondary | ICD-10-CM | POA: Diagnosis not present

## 2023-02-03 DIAGNOSIS — Z9181 History of falling: Secondary | ICD-10-CM | POA: Diagnosis not present

## 2023-02-03 DIAGNOSIS — Z87891 Personal history of nicotine dependence: Secondary | ICD-10-CM | POA: Diagnosis not present

## 2023-02-03 DIAGNOSIS — G300 Alzheimer's disease with early onset: Secondary | ICD-10-CM | POA: Diagnosis not present

## 2023-02-03 DIAGNOSIS — F0283 Dementia in other diseases classified elsewhere, unspecified severity, with mood disturbance: Secondary | ICD-10-CM | POA: Diagnosis not present

## 2023-02-03 DIAGNOSIS — E559 Vitamin D deficiency, unspecified: Secondary | ICD-10-CM | POA: Diagnosis not present

## 2023-02-03 DIAGNOSIS — E1100 Type 2 diabetes mellitus with hyperosmolarity without nonketotic hyperglycemic-hyperosmolar coma (NKHHC): Secondary | ICD-10-CM | POA: Diagnosis not present

## 2023-02-03 DIAGNOSIS — E860 Dehydration: Secondary | ICD-10-CM | POA: Diagnosis not present

## 2023-02-03 DIAGNOSIS — E7800 Pure hypercholesterolemia, unspecified: Secondary | ICD-10-CM | POA: Diagnosis not present

## 2023-02-03 DIAGNOSIS — N390 Urinary tract infection, site not specified: Secondary | ICD-10-CM | POA: Diagnosis not present

## 2023-02-03 DIAGNOSIS — F02818 Dementia in other diseases classified elsewhere, unspecified severity, with other behavioral disturbance: Secondary | ICD-10-CM | POA: Diagnosis not present

## 2023-02-03 DIAGNOSIS — G35 Multiple sclerosis: Secondary | ICD-10-CM | POA: Diagnosis not present

## 2023-02-03 DIAGNOSIS — Z7984 Long term (current) use of oral hypoglycemic drugs: Secondary | ICD-10-CM | POA: Diagnosis not present

## 2023-02-03 DIAGNOSIS — K5730 Diverticulosis of large intestine without perforation or abscess without bleeding: Secondary | ICD-10-CM | POA: Diagnosis not present

## 2023-02-04 DIAGNOSIS — I10 Essential (primary) hypertension: Secondary | ICD-10-CM | POA: Diagnosis not present

## 2023-02-04 DIAGNOSIS — K5730 Diverticulosis of large intestine without perforation or abscess without bleeding: Secondary | ICD-10-CM | POA: Diagnosis not present

## 2023-02-04 DIAGNOSIS — E7800 Pure hypercholesterolemia, unspecified: Secondary | ICD-10-CM | POA: Diagnosis not present

## 2023-02-04 DIAGNOSIS — I700 Atherosclerosis of aorta: Secondary | ICD-10-CM | POA: Diagnosis not present

## 2023-02-04 DIAGNOSIS — E1100 Type 2 diabetes mellitus with hyperosmolarity without nonketotic hyperglycemic-hyperosmolar coma (NKHHC): Secondary | ICD-10-CM | POA: Diagnosis not present

## 2023-02-04 DIAGNOSIS — G35 Multiple sclerosis: Secondary | ICD-10-CM | POA: Diagnosis not present

## 2023-02-04 DIAGNOSIS — F02818 Dementia in other diseases classified elsewhere, unspecified severity, with other behavioral disturbance: Secondary | ICD-10-CM | POA: Diagnosis not present

## 2023-02-04 DIAGNOSIS — G300 Alzheimer's disease with early onset: Secondary | ICD-10-CM | POA: Diagnosis not present

## 2023-02-04 DIAGNOSIS — F0283 Dementia in other diseases classified elsewhere, unspecified severity, with mood disturbance: Secondary | ICD-10-CM | POA: Diagnosis not present

## 2023-02-04 DIAGNOSIS — E860 Dehydration: Secondary | ICD-10-CM | POA: Diagnosis not present

## 2023-02-04 DIAGNOSIS — Z794 Long term (current) use of insulin: Secondary | ICD-10-CM | POA: Diagnosis not present

## 2023-02-04 DIAGNOSIS — Z9181 History of falling: Secondary | ICD-10-CM | POA: Diagnosis not present

## 2023-02-04 DIAGNOSIS — E559 Vitamin D deficiency, unspecified: Secondary | ICD-10-CM | POA: Diagnosis not present

## 2023-02-04 DIAGNOSIS — N390 Urinary tract infection, site not specified: Secondary | ICD-10-CM | POA: Diagnosis not present

## 2023-02-04 DIAGNOSIS — Z87891 Personal history of nicotine dependence: Secondary | ICD-10-CM | POA: Diagnosis not present

## 2023-02-04 DIAGNOSIS — Z7984 Long term (current) use of oral hypoglycemic drugs: Secondary | ICD-10-CM | POA: Diagnosis not present

## 2023-02-07 ENCOUNTER — Other Ambulatory Visit (HOSPITAL_COMMUNITY): Payer: Self-pay | Admitting: *Deleted

## 2023-02-07 DIAGNOSIS — G35 Multiple sclerosis: Secondary | ICD-10-CM | POA: Diagnosis not present

## 2023-02-07 DIAGNOSIS — I10 Essential (primary) hypertension: Secondary | ICD-10-CM | POA: Diagnosis not present

## 2023-02-07 DIAGNOSIS — E559 Vitamin D deficiency, unspecified: Secondary | ICD-10-CM | POA: Diagnosis not present

## 2023-02-07 DIAGNOSIS — N390 Urinary tract infection, site not specified: Secondary | ICD-10-CM | POA: Diagnosis not present

## 2023-02-07 DIAGNOSIS — F02818 Dementia in other diseases classified elsewhere, unspecified severity, with other behavioral disturbance: Secondary | ICD-10-CM | POA: Diagnosis not present

## 2023-02-07 DIAGNOSIS — E860 Dehydration: Secondary | ICD-10-CM | POA: Diagnosis not present

## 2023-02-07 DIAGNOSIS — K5730 Diverticulosis of large intestine without perforation or abscess without bleeding: Secondary | ICD-10-CM | POA: Diagnosis not present

## 2023-02-07 DIAGNOSIS — Z87891 Personal history of nicotine dependence: Secondary | ICD-10-CM | POA: Diagnosis not present

## 2023-02-07 DIAGNOSIS — R1310 Dysphagia, unspecified: Secondary | ICD-10-CM

## 2023-02-07 DIAGNOSIS — E7800 Pure hypercholesterolemia, unspecified: Secondary | ICD-10-CM | POA: Diagnosis not present

## 2023-02-07 DIAGNOSIS — Z9181 History of falling: Secondary | ICD-10-CM | POA: Diagnosis not present

## 2023-02-07 DIAGNOSIS — G300 Alzheimer's disease with early onset: Secondary | ICD-10-CM | POA: Diagnosis not present

## 2023-02-07 DIAGNOSIS — I700 Atherosclerosis of aorta: Secondary | ICD-10-CM | POA: Diagnosis not present

## 2023-02-07 DIAGNOSIS — F0283 Dementia in other diseases classified elsewhere, unspecified severity, with mood disturbance: Secondary | ICD-10-CM | POA: Diagnosis not present

## 2023-02-07 DIAGNOSIS — Z794 Long term (current) use of insulin: Secondary | ICD-10-CM | POA: Diagnosis not present

## 2023-02-07 DIAGNOSIS — Z7984 Long term (current) use of oral hypoglycemic drugs: Secondary | ICD-10-CM | POA: Diagnosis not present

## 2023-02-07 DIAGNOSIS — E1100 Type 2 diabetes mellitus with hyperosmolarity without nonketotic hyperglycemic-hyperosmolar coma (NKHHC): Secondary | ICD-10-CM | POA: Diagnosis not present

## 2023-02-08 DIAGNOSIS — F0283 Dementia in other diseases classified elsewhere, unspecified severity, with mood disturbance: Secondary | ICD-10-CM | POA: Diagnosis not present

## 2023-02-08 DIAGNOSIS — G300 Alzheimer's disease with early onset: Secondary | ICD-10-CM | POA: Diagnosis not present

## 2023-02-08 DIAGNOSIS — I10 Essential (primary) hypertension: Secondary | ICD-10-CM | POA: Diagnosis not present

## 2023-02-08 DIAGNOSIS — E559 Vitamin D deficiency, unspecified: Secondary | ICD-10-CM | POA: Diagnosis not present

## 2023-02-08 DIAGNOSIS — N390 Urinary tract infection, site not specified: Secondary | ICD-10-CM | POA: Diagnosis not present

## 2023-02-08 DIAGNOSIS — E7800 Pure hypercholesterolemia, unspecified: Secondary | ICD-10-CM | POA: Diagnosis not present

## 2023-02-08 DIAGNOSIS — Z9181 History of falling: Secondary | ICD-10-CM | POA: Diagnosis not present

## 2023-02-08 DIAGNOSIS — E860 Dehydration: Secondary | ICD-10-CM | POA: Diagnosis not present

## 2023-02-08 DIAGNOSIS — Z87891 Personal history of nicotine dependence: Secondary | ICD-10-CM | POA: Diagnosis not present

## 2023-02-08 DIAGNOSIS — Z794 Long term (current) use of insulin: Secondary | ICD-10-CM | POA: Diagnosis not present

## 2023-02-08 DIAGNOSIS — I700 Atherosclerosis of aorta: Secondary | ICD-10-CM | POA: Diagnosis not present

## 2023-02-08 DIAGNOSIS — K5730 Diverticulosis of large intestine without perforation or abscess without bleeding: Secondary | ICD-10-CM | POA: Diagnosis not present

## 2023-02-08 DIAGNOSIS — Z7984 Long term (current) use of oral hypoglycemic drugs: Secondary | ICD-10-CM | POA: Diagnosis not present

## 2023-02-08 DIAGNOSIS — F02818 Dementia in other diseases classified elsewhere, unspecified severity, with other behavioral disturbance: Secondary | ICD-10-CM | POA: Diagnosis not present

## 2023-02-08 DIAGNOSIS — G35 Multiple sclerosis: Secondary | ICD-10-CM | POA: Diagnosis not present

## 2023-02-08 DIAGNOSIS — E1100 Type 2 diabetes mellitus with hyperosmolarity without nonketotic hyperglycemic-hyperosmolar coma (NKHHC): Secondary | ICD-10-CM | POA: Diagnosis not present

## 2023-02-11 DIAGNOSIS — Z9181 History of falling: Secondary | ICD-10-CM | POA: Diagnosis not present

## 2023-02-11 DIAGNOSIS — F02818 Dementia in other diseases classified elsewhere, unspecified severity, with other behavioral disturbance: Secondary | ICD-10-CM | POA: Diagnosis not present

## 2023-02-11 DIAGNOSIS — N390 Urinary tract infection, site not specified: Secondary | ICD-10-CM | POA: Diagnosis not present

## 2023-02-11 DIAGNOSIS — I700 Atherosclerosis of aorta: Secondary | ICD-10-CM | POA: Diagnosis not present

## 2023-02-11 DIAGNOSIS — F0283 Dementia in other diseases classified elsewhere, unspecified severity, with mood disturbance: Secondary | ICD-10-CM | POA: Diagnosis not present

## 2023-02-11 DIAGNOSIS — E559 Vitamin D deficiency, unspecified: Secondary | ICD-10-CM | POA: Diagnosis not present

## 2023-02-11 DIAGNOSIS — G35 Multiple sclerosis: Secondary | ICD-10-CM | POA: Diagnosis not present

## 2023-02-11 DIAGNOSIS — E1100 Type 2 diabetes mellitus with hyperosmolarity without nonketotic hyperglycemic-hyperosmolar coma (NKHHC): Secondary | ICD-10-CM | POA: Diagnosis not present

## 2023-02-11 DIAGNOSIS — Z87891 Personal history of nicotine dependence: Secondary | ICD-10-CM | POA: Diagnosis not present

## 2023-02-11 DIAGNOSIS — Z7984 Long term (current) use of oral hypoglycemic drugs: Secondary | ICD-10-CM | POA: Diagnosis not present

## 2023-02-11 DIAGNOSIS — E860 Dehydration: Secondary | ICD-10-CM | POA: Diagnosis not present

## 2023-02-11 DIAGNOSIS — I10 Essential (primary) hypertension: Secondary | ICD-10-CM | POA: Diagnosis not present

## 2023-02-11 DIAGNOSIS — Z794 Long term (current) use of insulin: Secondary | ICD-10-CM | POA: Diagnosis not present

## 2023-02-11 DIAGNOSIS — E7800 Pure hypercholesterolemia, unspecified: Secondary | ICD-10-CM | POA: Diagnosis not present

## 2023-02-11 DIAGNOSIS — K5730 Diverticulosis of large intestine without perforation or abscess without bleeding: Secondary | ICD-10-CM | POA: Diagnosis not present

## 2023-02-11 DIAGNOSIS — G300 Alzheimer's disease with early onset: Secondary | ICD-10-CM | POA: Diagnosis not present

## 2023-02-14 DIAGNOSIS — E860 Dehydration: Secondary | ICD-10-CM | POA: Diagnosis not present

## 2023-02-14 DIAGNOSIS — Z87891 Personal history of nicotine dependence: Secondary | ICD-10-CM | POA: Diagnosis not present

## 2023-02-14 DIAGNOSIS — E1100 Type 2 diabetes mellitus with hyperosmolarity without nonketotic hyperglycemic-hyperosmolar coma (NKHHC): Secondary | ICD-10-CM | POA: Diagnosis not present

## 2023-02-14 DIAGNOSIS — Z7984 Long term (current) use of oral hypoglycemic drugs: Secondary | ICD-10-CM | POA: Diagnosis not present

## 2023-02-14 DIAGNOSIS — N390 Urinary tract infection, site not specified: Secondary | ICD-10-CM | POA: Diagnosis not present

## 2023-02-14 DIAGNOSIS — G300 Alzheimer's disease with early onset: Secondary | ICD-10-CM | POA: Diagnosis not present

## 2023-02-14 DIAGNOSIS — Z9181 History of falling: Secondary | ICD-10-CM | POA: Diagnosis not present

## 2023-02-14 DIAGNOSIS — Z794 Long term (current) use of insulin: Secondary | ICD-10-CM | POA: Diagnosis not present

## 2023-02-14 DIAGNOSIS — G35 Multiple sclerosis: Secondary | ICD-10-CM | POA: Diagnosis not present

## 2023-02-14 DIAGNOSIS — K5730 Diverticulosis of large intestine without perforation or abscess without bleeding: Secondary | ICD-10-CM | POA: Diagnosis not present

## 2023-02-14 DIAGNOSIS — I10 Essential (primary) hypertension: Secondary | ICD-10-CM | POA: Diagnosis not present

## 2023-02-14 DIAGNOSIS — E7800 Pure hypercholesterolemia, unspecified: Secondary | ICD-10-CM | POA: Diagnosis not present

## 2023-02-14 DIAGNOSIS — F02818 Dementia in other diseases classified elsewhere, unspecified severity, with other behavioral disturbance: Secondary | ICD-10-CM | POA: Diagnosis not present

## 2023-02-14 DIAGNOSIS — E559 Vitamin D deficiency, unspecified: Secondary | ICD-10-CM | POA: Diagnosis not present

## 2023-02-14 DIAGNOSIS — F0283 Dementia in other diseases classified elsewhere, unspecified severity, with mood disturbance: Secondary | ICD-10-CM | POA: Diagnosis not present

## 2023-02-14 DIAGNOSIS — I700 Atherosclerosis of aorta: Secondary | ICD-10-CM | POA: Diagnosis not present

## 2023-02-17 DIAGNOSIS — L22 Diaper dermatitis: Secondary | ICD-10-CM | POA: Diagnosis not present

## 2023-02-17 DIAGNOSIS — G35 Multiple sclerosis: Secondary | ICD-10-CM | POA: Diagnosis not present

## 2023-02-28 ENCOUNTER — Other Ambulatory Visit: Payer: Self-pay | Admitting: Neurology

## 2023-03-01 ENCOUNTER — Encounter (HOSPITAL_COMMUNITY): Payer: 59

## 2023-03-01 ENCOUNTER — Encounter (HOSPITAL_COMMUNITY): Payer: Self-pay

## 2023-03-01 NOTE — Telephone Encounter (Signed)
Last seen on 05/20/22 Follow up 05/24/23

## 2023-03-04 ENCOUNTER — Ambulatory Visit (HOSPITAL_COMMUNITY)
Admission: RE | Admit: 2023-03-04 | Discharge: 2023-03-04 | Disposition: A | Discharge status: Home / Self Care | Payer: 59 | Source: Ambulatory Visit | Attending: Family Medicine | Admitting: Family Medicine

## 2023-03-04 DIAGNOSIS — R1311 Dysphagia, oral phase: Secondary | ICD-10-CM | POA: Insufficient documentation

## 2023-03-04 DIAGNOSIS — I10 Essential (primary) hypertension: Secondary | ICD-10-CM | POA: Diagnosis not present

## 2023-03-04 DIAGNOSIS — G309 Alzheimer's disease, unspecified: Secondary | ICD-10-CM | POA: Diagnosis not present

## 2023-03-04 DIAGNOSIS — N390 Urinary tract infection, site not specified: Secondary | ICD-10-CM | POA: Diagnosis not present

## 2023-03-04 DIAGNOSIS — E860 Dehydration: Secondary | ICD-10-CM | POA: Diagnosis not present

## 2023-03-04 DIAGNOSIS — Z7984 Long term (current) use of oral hypoglycemic drugs: Secondary | ICD-10-CM | POA: Diagnosis not present

## 2023-03-04 DIAGNOSIS — K5730 Diverticulosis of large intestine without perforation or abscess without bleeding: Secondary | ICD-10-CM | POA: Diagnosis not present

## 2023-03-04 DIAGNOSIS — Z7401 Bed confinement status: Secondary | ICD-10-CM | POA: Insufficient documentation

## 2023-03-04 DIAGNOSIS — F0280 Dementia in other diseases classified elsewhere without behavioral disturbance: Secondary | ICD-10-CM | POA: Diagnosis not present

## 2023-03-04 DIAGNOSIS — E559 Vitamin D deficiency, unspecified: Secondary | ICD-10-CM | POA: Diagnosis not present

## 2023-03-04 DIAGNOSIS — I700 Atherosclerosis of aorta: Secondary | ICD-10-CM | POA: Diagnosis not present

## 2023-03-04 DIAGNOSIS — Z87891 Personal history of nicotine dependence: Secondary | ICD-10-CM | POA: Diagnosis not present

## 2023-03-04 DIAGNOSIS — G35 Multiple sclerosis: Secondary | ICD-10-CM | POA: Insufficient documentation

## 2023-03-04 DIAGNOSIS — R1312 Dysphagia, oropharyngeal phase: Secondary | ICD-10-CM | POA: Diagnosis present

## 2023-03-04 DIAGNOSIS — R1310 Dysphagia, unspecified: Secondary | ICD-10-CM

## 2023-03-04 DIAGNOSIS — Z9181 History of falling: Secondary | ICD-10-CM | POA: Diagnosis not present

## 2023-03-04 DIAGNOSIS — Z794 Long term (current) use of insulin: Secondary | ICD-10-CM | POA: Diagnosis not present

## 2023-03-04 DIAGNOSIS — E1100 Type 2 diabetes mellitus with hyperosmolarity without nonketotic hyperglycemic-hyperosmolar coma (NKHHC): Secondary | ICD-10-CM | POA: Diagnosis not present

## 2023-03-04 DIAGNOSIS — G300 Alzheimer's disease with early onset: Secondary | ICD-10-CM | POA: Insufficient documentation

## 2023-03-04 DIAGNOSIS — E7800 Pure hypercholesterolemia, unspecified: Secondary | ICD-10-CM | POA: Diagnosis not present

## 2023-03-04 NOTE — Evaluation (Signed)
Modified Barium Swallow Study  Patient Details  Name: Brandy Mckinney MRN: [id number] Date of Birth: November 02, 1965  Today's Date: 03/04/2023  Modified Barium Swallow completed.  Full report located under Chart Review in the Imaging Section.  History of Present Illness Pt is a 57 yo female presenting for OP MBS due to concerns for aspiration. Per pt and brother, she has been working with [facility] SLP who expresses concern due to coughing with PO intake. He believes that pt has been coughing less since they have made a concerted effort to improve her positioning as she eats in bed. She had a clinical evaluation of swallowing 12/30/22 that revealed impaired mastication and prolonged oral manipulation (dentures were not available), and she was recommended to have mechanical soft solids with thin liquids. PMH includes: early onset Alzheimer's dementia, MS, seizure, bed bound, fall, decreased vision   Clinical Impression Pt presents with an oral dysphagia with pharyngeal phase intact. No aspiration occurred across the study. Pt has oral holding of boluses regardless of consistency, exhibiting lingual rocking and delayed posterior transit. Her bolus cohesion and lingual hold is reduced, but she recollects boluses fairly well before swallowing. Mastication is slow with a small bite of graham cracker but with fairly good recollection. Trace residue is noted along her floor of mouth, but her oral clearance is pretty good with the exception of the barium tablet, which required a bite of puree to clear. Note that pt may have additional challenges at home including suboptimal positioning due to eating in bed. She also uses a sports water bottle that would squirt liquid into her mouth, and this was not available for use in testing today. Would recommend considering continuing small, chopped foods and thin liquids at the discretion of her primary SLP who can also observe her in a more natural setting.  Factors that may  increase risk of adverse event in presence of aspiration [employer]ye [employer]aks [employer] Clearance Coots 2021): Frail or deconditioned;Reduced cognitive function;Limited mobility  Swallow Evaluation Recommendations Recommendations: PO diet PO Diet Recommendation: Dysphagia 2 (Finely chopped);Thin liquids (Level 0) Liquid Administration via: Cup;Straw Medication Administration: Whole meds with liquid (could consider whole vs crushed in puree dependent on pref) Supervision: Staff to assist with self-feeding (family/aids to assist) Swallowing strategies  : Minimize environmental distractions;Slow rate;Small bites/sips Postural changes: Position pt fully upright for meals Oral care recommendations: Oral care BID (2x/day)      Mahala Menghini., M.A. CCC-SLP Acute Rehabilitation Services Office [phone]  Secure chat preferred  03/04/2023,12:50 PM

## 2023-03-08 DIAGNOSIS — E860 Dehydration: Secondary | ICD-10-CM | POA: Diagnosis not present

## 2023-03-08 DIAGNOSIS — G300 Alzheimer's disease with early onset: Secondary | ICD-10-CM | POA: Diagnosis not present

## 2023-03-08 DIAGNOSIS — F02818 Dementia in other diseases classified elsewhere, unspecified severity, with other behavioral disturbance: Secondary | ICD-10-CM | POA: Diagnosis not present

## 2023-03-08 DIAGNOSIS — F0283 Dementia in other diseases classified elsewhere, unspecified severity, with mood disturbance: Secondary | ICD-10-CM | POA: Diagnosis not present

## 2023-03-08 DIAGNOSIS — G35 Multiple sclerosis: Secondary | ICD-10-CM | POA: Diagnosis not present

## 2023-03-08 DIAGNOSIS — E1100 Type 2 diabetes mellitus with hyperosmolarity without nonketotic hyperglycemic-hyperosmolar coma (NKHHC): Secondary | ICD-10-CM | POA: Diagnosis not present

## 2023-03-08 DIAGNOSIS — E7800 Pure hypercholesterolemia, unspecified: Secondary | ICD-10-CM | POA: Diagnosis not present

## 2023-03-08 DIAGNOSIS — I700 Atherosclerosis of aorta: Secondary | ICD-10-CM | POA: Diagnosis not present

## 2023-03-08 DIAGNOSIS — I10 Essential (primary) hypertension: Secondary | ICD-10-CM | POA: Diagnosis not present

## 2023-03-08 IMAGING — CT CT HEAD CODE STROKE
4 series · 16 of 47 positions shown, 18 images · non-contrast
Comparison: Correlation made with 6164 brain MRI

CLINICAL DATA: Code stroke.  Right-sided weakness



[Series 3: head wo · axial · 0.35mm/px · z∈[+705,+817]mm · 6 of 33 slices shown, 8 images]
[im 5/33  brain]
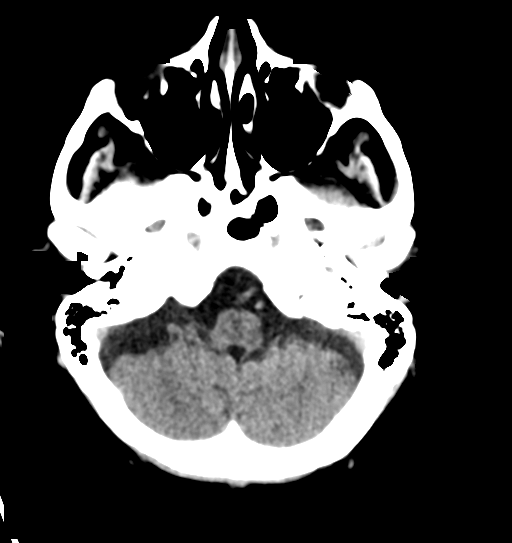
[im 5/33  bone]
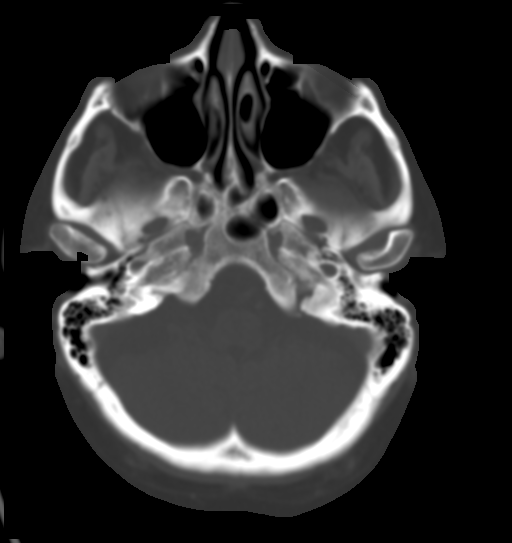
[im 10/33  brain]
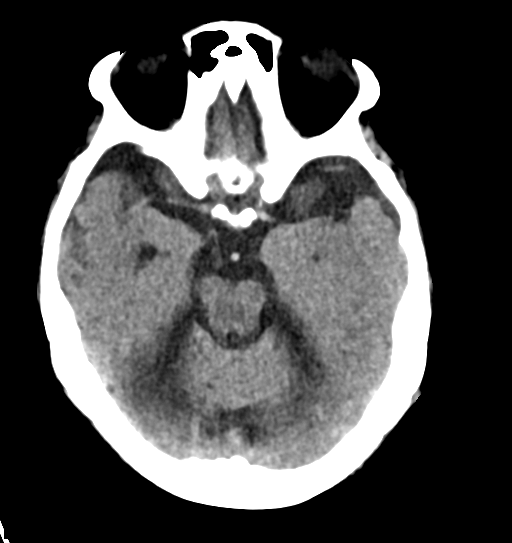
[im 14/33  brain]
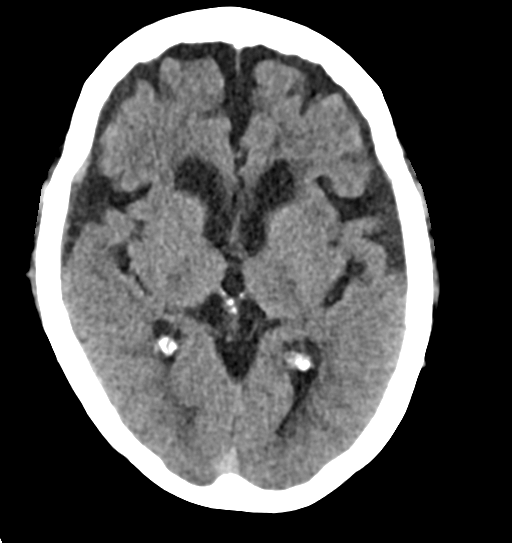
[im 19/33  brain]
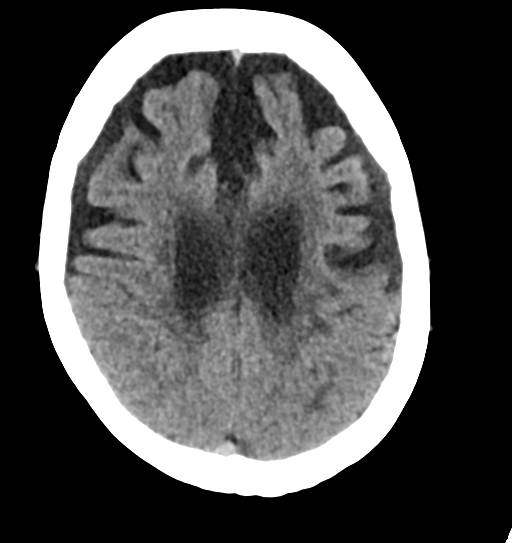
[im 23/33  brain]
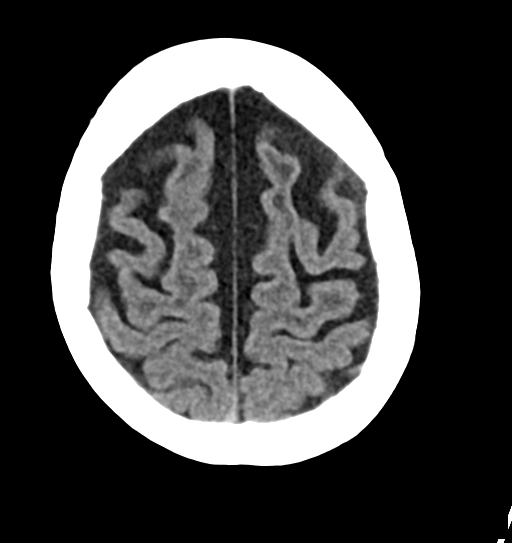
[im 23/33  bone]
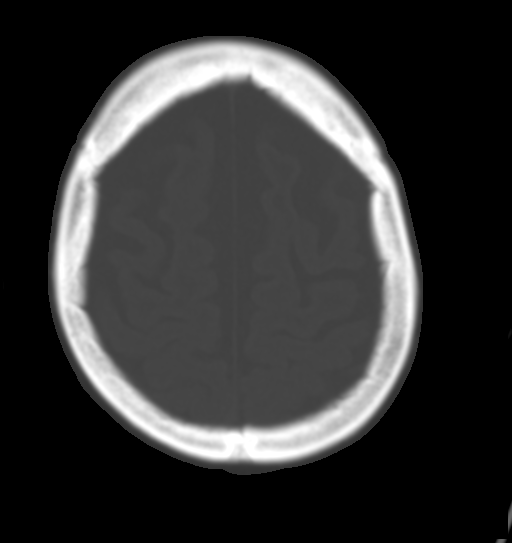
[im 28/33  brain]
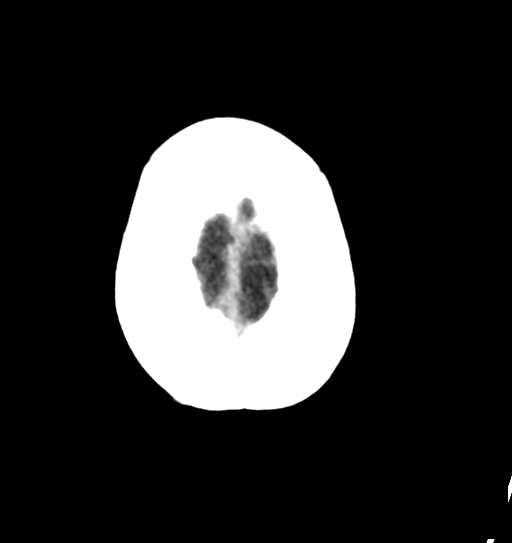

[Series 4: head bone · axial · 0.41mm/px · z∈[+709,+765]mm · 4 of 85 slices shown]
[im 9/85  bone]
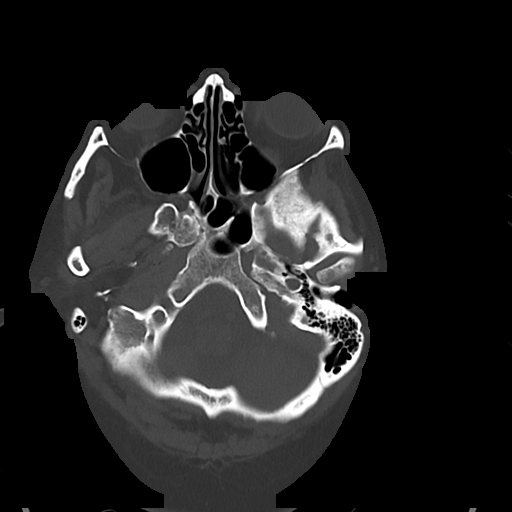
[im 17/85  bone]
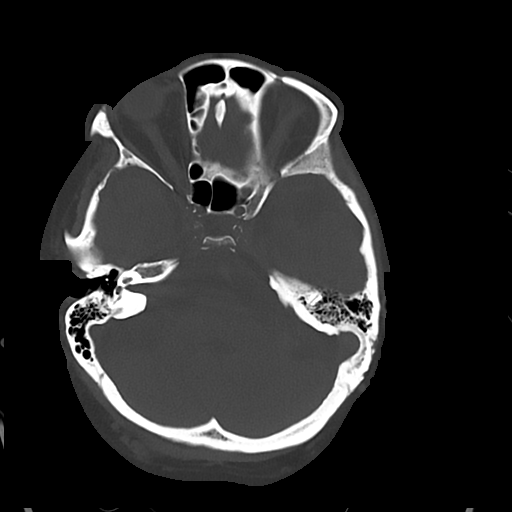
[im 29/85  bone]
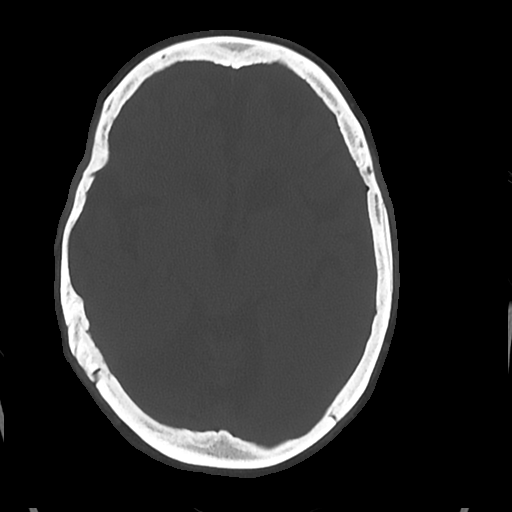
[im 37/85  bone]
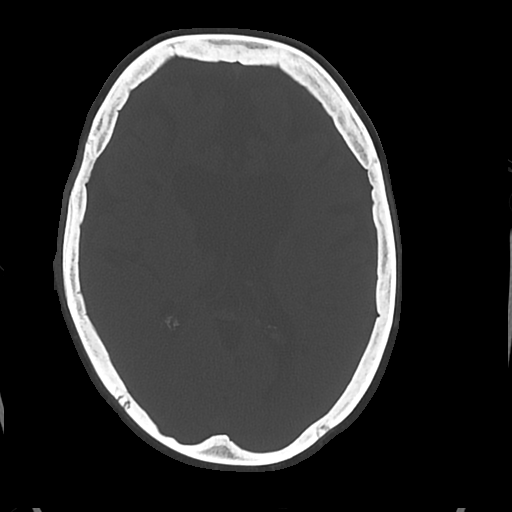

[Series 5: cor soft · coronal · 0.35mm/px · 3 of 67 slices shown]
[im 23/67  brain]
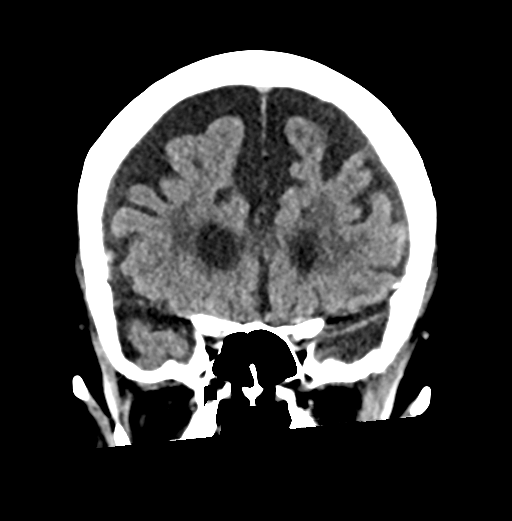
[im 30/67  brain]
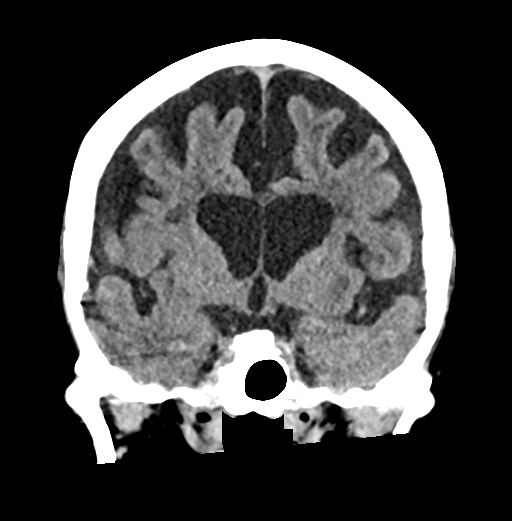
[im 37/67  brain]
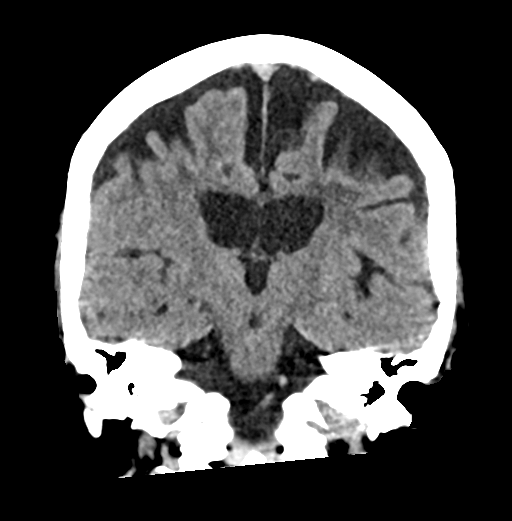

[Series 6: sag soft · sagittal · 0.33mm/px · 3 of 60 slices shown]
[im 20/60  brain]
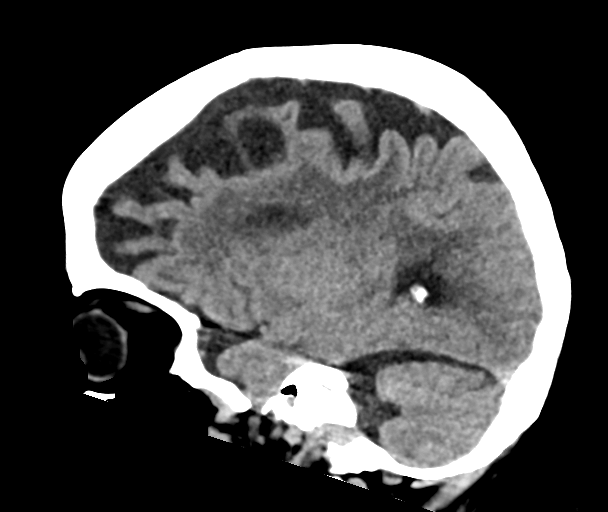
[im 30/60  brain]
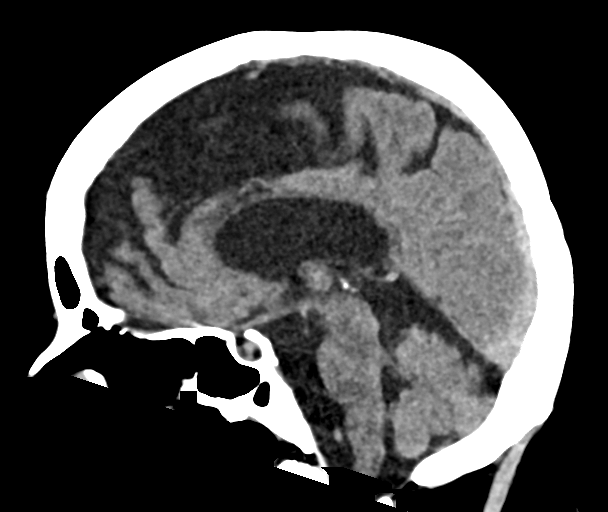
[im 40/60  brain]
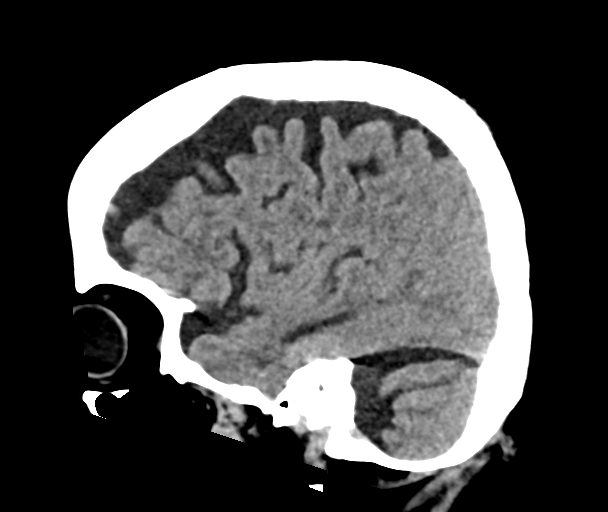

[16 of 47 positions shown; findings below may reference images not displayed]

FINDINGS: Brain: There is no acute intracranial hemorrhage, mass effect, or
edema. Gray-white differentiation is preserved. Patchy and confluent
areas of hypoattenuation in the supratentorial periventricular
greater than subcortical white matter likely reflect sequelae of
multiple sclerosis. Prominence of the ventricles and sulci reflects
parenchymal volume loss greater than expected for age. No
extra-axial collection.

Vascular: No hyperdense vessel.

Skull: Unremarkable.

Sinuses/Orbits: No acute abnormality.

Other: Mastoid air cells are clear.

ASPECTS (Alberta Stroke Program Early CT Score)

- Ganglionic level infarction (caudate, lentiform nuclei, internal
capsule, insula, M1-M3 cortex): 7

- Supraganglionic infarction (M4-M6 cortex): 3

Total score (0-10 with 10 being normal): 10
IMPRESSION: There is no acute intracranial hemorrhage or evidence of acute
infarction. ASPECT score is 10.

Advanced chronic demyelinating disease. Advanced parenchymal volume
loss.

These results were communicated to Dr. Sorensen at [DATE] on
11/22/2021 by text page via the [employer] messaging system.

## 2023-03-11 DIAGNOSIS — R1312 Dysphagia, oropharyngeal phase: Secondary | ICD-10-CM | POA: Diagnosis not present

## 2023-03-11 DIAGNOSIS — Z9181 History of falling: Secondary | ICD-10-CM | POA: Diagnosis not present

## 2023-03-11 DIAGNOSIS — E860 Dehydration: Secondary | ICD-10-CM | POA: Diagnosis not present

## 2023-03-11 DIAGNOSIS — G300 Alzheimer's disease with early onset: Secondary | ICD-10-CM | POA: Diagnosis not present

## 2023-03-11 DIAGNOSIS — I10 Essential (primary) hypertension: Secondary | ICD-10-CM | POA: Diagnosis not present

## 2023-03-11 DIAGNOSIS — Z87891 Personal history of nicotine dependence: Secondary | ICD-10-CM | POA: Diagnosis not present

## 2023-03-11 DIAGNOSIS — Z87440 Personal history of urinary (tract) infections: Secondary | ICD-10-CM | POA: Diagnosis not present

## 2023-03-11 DIAGNOSIS — I700 Atherosclerosis of aorta: Secondary | ICD-10-CM | POA: Diagnosis not present

## 2023-03-11 DIAGNOSIS — K5730 Diverticulosis of large intestine without perforation or abscess without bleeding: Secondary | ICD-10-CM | POA: Diagnosis not present

## 2023-03-11 DIAGNOSIS — G35 Multiple sclerosis: Secondary | ICD-10-CM | POA: Diagnosis not present

## 2023-03-11 DIAGNOSIS — E7800 Pure hypercholesterolemia, unspecified: Secondary | ICD-10-CM | POA: Diagnosis not present

## 2023-03-11 DIAGNOSIS — E1100 Type 2 diabetes mellitus with hyperosmolarity without nonketotic hyperglycemic-hyperosmolar coma (NKHHC): Secondary | ICD-10-CM | POA: Diagnosis not present

## 2023-03-11 DIAGNOSIS — E559 Vitamin D deficiency, unspecified: Secondary | ICD-10-CM | POA: Diagnosis not present

## 2023-03-11 DIAGNOSIS — Z7982 Long term (current) use of aspirin: Secondary | ICD-10-CM | POA: Diagnosis not present

## 2023-03-11 DIAGNOSIS — Z794 Long term (current) use of insulin: Secondary | ICD-10-CM | POA: Diagnosis not present

## 2023-03-11 DIAGNOSIS — Z7984 Long term (current) use of oral hypoglycemic drugs: Secondary | ICD-10-CM | POA: Diagnosis not present

## 2023-03-14 DIAGNOSIS — Z9181 History of falling: Secondary | ICD-10-CM | POA: Diagnosis not present

## 2023-03-14 DIAGNOSIS — E860 Dehydration: Secondary | ICD-10-CM | POA: Diagnosis not present

## 2023-03-14 DIAGNOSIS — Z794 Long term (current) use of insulin: Secondary | ICD-10-CM | POA: Diagnosis not present

## 2023-03-14 DIAGNOSIS — I10 Essential (primary) hypertension: Secondary | ICD-10-CM | POA: Diagnosis not present

## 2023-03-14 DIAGNOSIS — K5730 Diverticulosis of large intestine without perforation or abscess without bleeding: Secondary | ICD-10-CM | POA: Diagnosis not present

## 2023-03-14 DIAGNOSIS — E559 Vitamin D deficiency, unspecified: Secondary | ICD-10-CM | POA: Diagnosis not present

## 2023-03-14 DIAGNOSIS — E1100 Type 2 diabetes mellitus with hyperosmolarity without nonketotic hyperglycemic-hyperosmolar coma (NKHHC): Secondary | ICD-10-CM | POA: Diagnosis not present

## 2023-03-14 DIAGNOSIS — Z87440 Personal history of urinary (tract) infections: Secondary | ICD-10-CM | POA: Diagnosis not present

## 2023-03-14 DIAGNOSIS — I700 Atherosclerosis of aorta: Secondary | ICD-10-CM | POA: Diagnosis not present

## 2023-03-14 DIAGNOSIS — G35 Multiple sclerosis: Secondary | ICD-10-CM | POA: Diagnosis not present

## 2023-03-14 DIAGNOSIS — E7800 Pure hypercholesterolemia, unspecified: Secondary | ICD-10-CM | POA: Diagnosis not present

## 2023-03-14 DIAGNOSIS — G300 Alzheimer's disease with early onset: Secondary | ICD-10-CM | POA: Diagnosis not present

## 2023-03-14 DIAGNOSIS — R1312 Dysphagia, oropharyngeal phase: Secondary | ICD-10-CM | POA: Diagnosis not present

## 2023-03-14 DIAGNOSIS — Z7984 Long term (current) use of oral hypoglycemic drugs: Secondary | ICD-10-CM | POA: Diagnosis not present

## 2023-03-14 DIAGNOSIS — Z7982 Long term (current) use of aspirin: Secondary | ICD-10-CM | POA: Diagnosis not present

## 2023-03-14 DIAGNOSIS — Z87891 Personal history of nicotine dependence: Secondary | ICD-10-CM | POA: Diagnosis not present

## 2023-03-19 ENCOUNTER — Other Ambulatory Visit: Payer: Self-pay | Admitting: Neurology

## 2023-03-19 DIAGNOSIS — G35 Multiple sclerosis: Secondary | ICD-10-CM | POA: Diagnosis not present

## 2023-03-19 DIAGNOSIS — L22 Diaper dermatitis: Secondary | ICD-10-CM | POA: Diagnosis not present

## 2023-03-24 DIAGNOSIS — Z794 Long term (current) use of insulin: Secondary | ICD-10-CM | POA: Diagnosis not present

## 2023-03-24 DIAGNOSIS — G300 Alzheimer's disease with early onset: Secondary | ICD-10-CM | POA: Diagnosis not present

## 2023-03-24 DIAGNOSIS — E860 Dehydration: Secondary | ICD-10-CM | POA: Diagnosis not present

## 2023-03-24 DIAGNOSIS — Z9181 History of falling: Secondary | ICD-10-CM | POA: Diagnosis not present

## 2023-03-24 DIAGNOSIS — Z87891 Personal history of nicotine dependence: Secondary | ICD-10-CM | POA: Diagnosis not present

## 2023-03-24 DIAGNOSIS — I700 Atherosclerosis of aorta: Secondary | ICD-10-CM | POA: Diagnosis not present

## 2023-03-24 DIAGNOSIS — K5730 Diverticulosis of large intestine without perforation or abscess without bleeding: Secondary | ICD-10-CM | POA: Diagnosis not present

## 2023-03-24 DIAGNOSIS — G35 Multiple sclerosis: Secondary | ICD-10-CM | POA: Diagnosis not present

## 2023-03-24 DIAGNOSIS — Z87440 Personal history of urinary (tract) infections: Secondary | ICD-10-CM | POA: Diagnosis not present

## 2023-03-24 DIAGNOSIS — E7800 Pure hypercholesterolemia, unspecified: Secondary | ICD-10-CM | POA: Diagnosis not present

## 2023-03-24 DIAGNOSIS — E559 Vitamin D deficiency, unspecified: Secondary | ICD-10-CM | POA: Diagnosis not present

## 2023-03-24 DIAGNOSIS — Z7984 Long term (current) use of oral hypoglycemic drugs: Secondary | ICD-10-CM | POA: Diagnosis not present

## 2023-03-24 DIAGNOSIS — R1312 Dysphagia, oropharyngeal phase: Secondary | ICD-10-CM | POA: Diagnosis not present

## 2023-03-24 DIAGNOSIS — Z7982 Long term (current) use of aspirin: Secondary | ICD-10-CM | POA: Diagnosis not present

## 2023-03-24 DIAGNOSIS — E1100 Type 2 diabetes mellitus with hyperosmolarity without nonketotic hyperglycemic-hyperosmolar coma (NKHHC): Secondary | ICD-10-CM | POA: Diagnosis not present

## 2023-03-24 DIAGNOSIS — I10 Essential (primary) hypertension: Secondary | ICD-10-CM | POA: Diagnosis not present

## 2023-04-19 DIAGNOSIS — L22 Diaper dermatitis: Secondary | ICD-10-CM | POA: Diagnosis not present

## 2023-04-19 DIAGNOSIS — G35 Multiple sclerosis: Secondary | ICD-10-CM | POA: Diagnosis not present

## 2023-04-20 DIAGNOSIS — Z79899 Other long term (current) drug therapy: Secondary | ICD-10-CM | POA: Diagnosis not present

## 2023-04-20 DIAGNOSIS — G35 Multiple sclerosis: Secondary | ICD-10-CM | POA: Diagnosis not present

## 2023-04-20 DIAGNOSIS — Z0000 Encounter for general adult medical examination without abnormal findings: Secondary | ICD-10-CM | POA: Diagnosis not present

## 2023-04-20 DIAGNOSIS — E7800 Pure hypercholesterolemia, unspecified: Secondary | ICD-10-CM | POA: Diagnosis not present

## 2023-04-20 DIAGNOSIS — I700 Atherosclerosis of aorta: Secondary | ICD-10-CM | POA: Diagnosis not present

## 2023-04-20 DIAGNOSIS — Z23 Encounter for immunization: Secondary | ICD-10-CM | POA: Diagnosis not present

## 2023-04-20 DIAGNOSIS — Z993 Dependence on wheelchair: Secondary | ICD-10-CM | POA: Diagnosis not present

## 2023-04-20 DIAGNOSIS — E1165 Type 2 diabetes mellitus with hyperglycemia: Secondary | ICD-10-CM | POA: Diagnosis not present

## 2023-04-20 DIAGNOSIS — R509 Fever, unspecified: Secondary | ICD-10-CM | POA: Diagnosis not present

## 2023-05-19 DIAGNOSIS — G35 Multiple sclerosis: Secondary | ICD-10-CM | POA: Diagnosis not present

## 2023-05-19 DIAGNOSIS — L22 Diaper dermatitis: Secondary | ICD-10-CM | POA: Diagnosis not present

## 2023-05-19 DIAGNOSIS — Z1211 Encounter for screening for malignant neoplasm of colon: Secondary | ICD-10-CM | POA: Diagnosis not present

## 2023-05-23 ENCOUNTER — Ambulatory Visit
Admission: RE | Admit: 2023-05-23 | Discharge: 2023-05-23 | Disposition: A | Discharge status: Home / Self Care | Payer: 59 | Source: Ambulatory Visit | Attending: Family Medicine

## 2023-05-23 DIAGNOSIS — Z1239 Encounter for other screening for malignant neoplasm of breast: Secondary | ICD-10-CM

## 2023-05-23 DIAGNOSIS — N6325 Unspecified lump in the left breast, overlapping quadrants: Secondary | ICD-10-CM | POA: Diagnosis not present

## 2023-05-23 DIAGNOSIS — N6320 Unspecified lump in the left breast, unspecified quadrant: Secondary | ICD-10-CM

## 2023-05-23 DIAGNOSIS — N6315 Unspecified lump in the right breast, overlapping quadrants: Secondary | ICD-10-CM | POA: Diagnosis not present

## 2023-05-24 ENCOUNTER — Ambulatory Visit (INDEPENDENT_AMBULATORY_CARE_PROVIDER_SITE_OTHER): Payer: 59 | Admitting: Neurology

## 2023-05-24 ENCOUNTER — Encounter: Payer: Self-pay | Admitting: Neurology

## 2023-05-24 VITALS — BP 129/86 | HR 54 | Resp 16 | Ht 62.0 in

## 2023-05-24 DIAGNOSIS — G35 Multiple sclerosis: Secondary | ICD-10-CM

## 2023-05-24 DIAGNOSIS — Z993 Dependence on wheelchair: Secondary | ICD-10-CM

## 2023-05-24 DIAGNOSIS — F0280 Dementia in other diseases classified elsewhere without behavioral disturbance: Secondary | ICD-10-CM | POA: Diagnosis not present

## 2023-05-24 MED ORDER — LEVETIRACETAM 1000 MG PO TABS: 1000.0000 mg | ORAL_TABLET | Freq: Two times a day (BID) | ORAL | 3 refills | Status: DC

## 2023-05-24 MED ORDER — GABAPENTIN 300 MG PO CAPS: 300.0000 mg | ORAL_CAPSULE | Freq: Three times a day (TID) | ORAL | 3 refills | Status: AC

## 2023-05-24 NOTE — Patient Instructions (Signed)
Meds ordered this encounter  Medications   levETIRAcetam (KEPPRA) 1000 MG tablet    Sig: Take 1 tablet (1,000 mg total) by mouth 2 (two) times daily.    Dispense:  180 tablet    Refill:  3   gabapentin (NEURONTIN) 300 MG capsule    Sig: Take 1 capsule (300 mg total) by mouth 3 (three) times daily.    Dispense:  270 capsule    Refill:  3    Hello we are a Journalist, newspaper trying synchronize all medications to place them in compliance packets for this patient. The patient was signed up for our service on 7/31. Please send meds to [facility]. Thanks   Check routine labs today

## 2023-05-24 NOTE — Progress Notes (Addendum)
Patient: Ahmiya Abee Date of Birth: [birth year]  Reason for Visit: Follow up History from: Patient, brother Primary Neurologist: [facility]   ASSESSMENT AND PLAN 57 y.o. year old female   1.  Multiple sclerosis (Mavenclad 2021 2022) 2.  Dementia 3.  Seizures  -Overall, no major changes, wheelchair-bound, homebound with home health -Completed Mavenclad in 2021, 2022, did well with this -Will update basic labs today -Continue Keppra.  Denies any recent seizures. -Continue gabapentin 300 mg 3 times daily for paresthesia -Main issue constipation, continue work with home health, primary care -Last MRI April 2023 stable, will check MRI brain for surveillance around March 2025. Should be every 18-24 months per Dr. Epimenio Foot -Follow-up in 1 year or sooner if needed, they wish to see Dr. Epimenio Foot   HISTORY OF PRESENT ILLNESS: Today 05/24/23 Admitted June 2024 with type 2 diabetes new diagnosis, severe dehydration, metabolic encephalopathy, sepsis due to UTI.  A1c 10.  CT head showed severe atrophy, chronic SVD. Here with her brother, she lives at his house, has help with home health care agencies. No seizures, remains on Keppra. On gabapentin 300 mg TID,  pain is extremities is manageable. Currently dealing constipation, impaction . On insulin now, long and short term, has good control rarely needs short acting. With memory good and bad days, STM is worse. Mostly agreeable. Has a good appetite. She is bedridden.   Update 05/20/2022 She had her first Mavenclad course in December 2020/January 2021.  She had a second year December 2021 and  January 2022.  Both times she tolerated the pills well.  She had no side effects.      ALC was normal 0.9 11/22/2021   ALT/AST normal 10/21/2021.     She has had no infection.   She had a mammogram last week and fell/was dropped during the transfer.  She had an abrasion.    She has had her flu and Covid shot.   She had the first shingles shot.       She is  wheelchair bound.   She does not help with transfers.   She has left arm > right arm weakness and both legs weak, worse on left.    She was experiencing dysesthesias in her legs.  Gabapentin has helped her dysesthesias.   No change in her bladder pattern (she is incontinent) they have not noticed any skin breakdown .  She continues to experience intermittent flank pain, similar to pain she had late last year but milder  She has incontinence and wears Depends.     She has constipation.   Cognition is about with reduced STM, focus/attention.   She often has mild confusion.   She jumbles her words and also has word finding difficulties   She has no recent seizures.   She takes levetiracetam and tolerates it well      MS history:  She was diagnosed with MS at age 12 (30) after presenting with optic neuritis in both eyes.  Over the next few years, she began to experience more difficulty with her gait.  This steadily progressed in her 49s 30s and 58s.  For many years, she used a walker.  About 7 or 8 years ago after repeated falls she stopped using the walker and became wheelchair-bound.  At some point, she was started on Avonex.  She remained on this medication for many years.  She continued to progress and was switched to Tecfidera.  She stayed on that medication for several years  and then in 2017 was switched to Ocrevus. Travel was difficult for her to do. In 2020 she switched to [facility]. She had the first course December 2020 in January 2021.    Other neurologic history: She has a history of seizures. They have been well controlled on levetiracetam.   Data: MRI of the brain dated 02/19/2019 [facility]): It shows multiple T2/flair hyperintense foci in the brainstem, cerebellum and in the periventricular, juxtacortical and deep white matter of the hemispheres in a pattern compatible with MS.  There were no enhancing lesions.   MRI of the brain 11/22/2021 and 04/2020 were unchanged.       I reviewed lab tests from 02/19/2019: CBC was normal.  Lymphocyte count was normal.  She had an mildly elevated ALT but normal AST.  Vitamin D was normal.   Hepatitis Be antigen and hepatitis B core antibody were negative.  Hep C antibody and RNA was negative.  HIV was negative.  QuantiFERON TB was negative.  Varicella-zoster IgG showed immunity.  REVIEW OF SYSTEMS: Out of a complete 14 system review of symptoms, the patient complains only of the following symptoms, and all other reviewed systems are negative.  See HPI  ALLERGIES: Allergies  Allergen Reactions   Fentanyl Other (See Comments)     can not have anything on her skin    HOME MEDICATIONS: Outpatient Medications Prior to Visit  Medication Sig Dispense Refill   amLODipine (NORVASC) 10 MG tablet Take 1 tablet (10 mg total) by mouth daily. 30 tablet 0   Ascorbic Acid (VITAMIN C) 1000 MG tablet Take 1,000 mg by mouth in the morning.     aspirin 81 MG chewable tablet Chew 1 tablet (81 mg total) by mouth daily. 30 tablet 0   Blood Glucose Monitoring Suppl (BLOOD GLUCOSE MONITOR SYSTEM) w/Device KIT Use in the morning, at noon, and at bedtime. 1 kit 0   Cholecalciferol (VITAMIN D3) 50 MCG (2000 UT) TABS Take 2,000 Units by mouth daily.     Cranberry 500 MG TABS Take 500 mg by mouth daily.     cyclobenzaprine (FLEXERIL) 10 MG tablet Take 10 mg by mouth at bedtime.     etodolac (LODINE) 400 MG tablet Take 1 tablet (400 mg total) by mouth 2 (two) times daily as needed. 180 tablet 3   FLUoxetine (PROZAC) 20 MG capsule Take 20 mg by mouth in the morning.     gabapentin (NEURONTIN) 300 MG capsule NEW PRESCRIPTION REQUEST: TAKE ONE CAPSULE BY MOUTH THREE TIMES DAILY 270 capsule 0   insulin glargine (LANTUS) 100 UNIT/ML Solostar Pen Inject 55 Units into the skin daily. 15 mL 0   insulin lispro (HUMALOG) 100 UNIT/ML KwikPen Before each meal 3 times a day, 140-199 - 2 units, 200-250 - 6 units, 251-299 - 8 units,  300-349 - 10 units,  350 or  above 14 units. 15 mL 0   Insulin Pen Needle 32G X 4 MM MISC Use as directed with insulin 4 (four) times daily. 100 each 0   Inulin (FIBER CHOICE PO) Take 1 tablet by mouth in the morning.     Lancet Device MISC Use as directed to monitor blood sugar 3 (three) times daily (In the morning, at noon, and at bedtime). 1 each 0   levETIRAcetam (KEPPRA) 1000 MG tablet TAKE 1 TABLET BY MOUTH TWICE A DAY 180 tablet 1   Multiple Vitamin (MULTIVITAMIN WITH MINERALS) TABS tablet Take 1 tablet by mouth in the morning.  omeprazole (PRILOSEC) 20 MG capsule Take 20 mg by mouth daily before breakfast.     Polyethyl Glycol-Propyl Glycol (LUBRICANT EYE DROPS) 0.4-0.3 % SOLN Place 1-2 drops into both eyes 3 (three) times daily as needed (dry/irritated eyes.).     simvastatin (ZOCOR) 20 MG tablet Take 20 mg by mouth every evening.     triamcinolone cream (KENALOG) 0.1 % Apply 1 Application topically daily. After bathing     No facility-administered medications prior to visit.    PAST MEDICAL HISTORY: Past Medical History:  Diagnosis Date   Early onset Alzheimer's dementia (HCC)    Hypercholesteremia    Hyperlipidemia    Hypertension    Lumbosacral pain    Multiple sclerosis (HCC)    Seizure (HCC)    Vitamin D deficiency     PAST SURGICAL HISTORY: Past Surgical History:  Procedure Laterality Date   ABDOMINAL HYSTERECTOMY     CESAREAN SECTION     OOPHORECTOMY      FAMILY HISTORY: No family history on file.  SOCIAL HISTORY: Social History   Socioeconomic History   Marital status: Legally Separated    Spouse name: Not on file   Number of children: Not on file   Years of education: Not on file   Highest education level: Not on file  Occupational History   Not on file  Tobacco Use   Smoking status: Former    Current packs/day: 0.00    Types: Cigarettes    Quit date: 2012    Years since quitting: 12.8   Smokeless tobacco: Never  Vaping Use   Vaping status: Never Used  Substance and  Sexual Activity   Alcohol use: Not Currently   Drug use: Never   Sexual activity: Not on file  Other Topics Concern   Not on file  Social History Narrative   Lives at home with brother tommy    Right handed    Social Determinants of Health   Financial Resource Strain: Not on file  Food Insecurity: Not on file  Transportation Needs: Not on file  Physical Activity: Not on file  Stress: Not on file  Social Connections: Not on file  Intimate Partner Violence: Not on file   PHYSICAL EXAM  Vitals:   05/24/23 1401  BP: 129/86  Pulse: (!) 54  Resp: 16  Height: 5\' 2"  (1.575 m)   Body mass index is 32.66 kg/m.  Generalized: Well developed, in no acute distress, well chair bound Neurological examination  Mentation: Alert, oriented, brother provides most history.  Has dysarthria with speech.  Repeats herself. Cranial nerve II-XII: Pupils were equal round reactive to light. Extraocular movements were full, visual field were full on confrontational test. Facial sensation and strength were normal. Uvula tongue midline. Head turning and shoulder shrug  were normal and symmetric. Motor: Increased tone to all extremities left more than right, 4/5 right upper, 3/5 left upper. 3/5 bilateral lowers Sensory: Sensory testing is intact to soft touch on all 4 extremities. No evidence of extinction is noted.  Coordination: Dysmetria bilaterally, worse with the left finger-nose-finger.  Difficulty performing heel-to-shin bilaterally. Gait and station: Nonambulatory, wheelchair-bound Reflexes: Deep tendon reflexes are symmetric but increased at the knees.  DIAGNOSTIC DATA (LABS, IMAGING, TESTING) - I reviewed patient records, labs, notes, testing and imaging myself where available.  Lab Results  Component Value Date   WBC 7.3 01/01/2023   HGB 14.7 01/01/2023   HCT 43.8 01/01/2023   MCV 92.6 01/01/2023   PLT 169 01/01/2023  Component Value Date/Time   NA 134 (L) 01/01/2023 0218   NA  145 (H) 10/21/2021 1039   K 3.5 01/01/2023 0218   CL 97 (L) 01/01/2023 0218   CO2 28 01/01/2023 0218   GLUCOSE 283 (H) 01/01/2023 0218   BUN 7 01/01/2023 0218   BUN 7 10/21/2021 1039   CREATININE 0.57 01/01/2023 0218   CALCIUM 8.0 (L) 01/01/2023 0218   PROT 7.1 12/29/2022 1901   PROT 5.7 (L) 10/21/2021 1039   ALBUMIN 4.0 12/29/2022 1901   ALBUMIN 4.0 10/21/2021 1039   AST 37 12/29/2022 1901   ALT 65 (H) 12/29/2022 1901   ALKPHOS 148 (H) 12/29/2022 1901   BILITOT 1.2 12/29/2022 1901   BILITOT 0.6 10/21/2021 1039   GFRNONAA >60 01/01/2023 0218   GFRAA 130 04/15/2020 1617   No results found for: "CHOL", "HDL", "LDLCALC", "LDLDIRECT", "TRIG", "CHOLHDL" Lab Results  Component Value Date   HGBA1C 10.0 (H) 12/30/2022   No results found for: "VITAMINB12" No results found for: "TSH"  Margie Ege, AGNP-C, DNP 05/24/2023, 2:19 PM [facility] [address] [place], [place] [address] [phone]

## 2023-05-27 LAB — COMPREHENSIVE METABOLIC PANEL WITH GFR
ALT: 51 IU/L — ABNORMAL HIGH (ref 0–32)
AST: 22 IU/L (ref 0–40)
Albumin: 4.1 g/dL (ref 3.8–4.9)
Alkaline Phosphatase: 170 IU/L — ABNORMAL HIGH (ref 44–121)
BUN/Creatinine Ratio: 32 — ABNORMAL HIGH (ref 9–23)
BUN: 13 mg/dL (ref 6–24)
Bilirubin Total: 0.9 mg/dL (ref 0.0–1.2)
CO2: 22 mmol/L (ref 20–29)
Calcium: 8.9 mg/dL (ref 8.7–10.2)
Chloride: 105 mmol/L (ref 96–106)
Creatinine, Ser: 0.41 mg/dL — ABNORMAL LOW (ref 0.57–1.00)
Globulin, Total: 2.1 g/dL (ref 1.5–4.5)
Glucose: 198 mg/dL — ABNORMAL HIGH (ref 70–99)
Potassium: 3.6 mmol/L (ref 3.5–5.2)
Sodium: 146 mmol/L — ABNORMAL HIGH (ref 134–144)
Total Protein: 6.2 g/dL (ref 6.0–8.5)
eGFR: 117 mL/min/1.73

## 2023-05-27 LAB — CBC WITH DIFFERENTIAL/PLATELET
Basophils Absolute: 0 10*3/uL (ref 0.0–0.2)
Basos: 0 %
EOS (ABSOLUTE): 0.3 10*3/uL (ref 0.0–0.4)
Eos: 4 %
Hematocrit: 50.7 % — ABNORMAL HIGH (ref 34.0–46.6)
Hemoglobin: 15.8 g/dL (ref 11.1–15.9)
Immature Grans (Abs): 0.1 10*3/uL (ref 0.0–0.1)
Immature Granulocytes: 1 %
Lymphocytes Absolute: 1.1 10*3/uL (ref 0.7–3.1)
Lymphs: 13 %
MCH: 28.3 pg (ref 26.6–33.0)
MCHC: 31.2 g/dL — ABNORMAL LOW (ref 31.5–35.7)
MCV: 91 fL (ref 79–97)
Monocytes Absolute: 0.4 10*3/uL (ref 0.1–0.9)
Monocytes: 4 %
Neutrophils Absolute: 6.7 10*3/uL (ref 1.4–7.0)
Neutrophils: 78 %
Platelets: 240 10*3/uL (ref 150–450)
RBC: 5.58 x10E6/uL — ABNORMAL HIGH (ref 3.77–5.28)
RDW: 13.7 % (ref 11.7–15.4)
WBC: 8.5 10*3/uL (ref 3.4–10.8)

## 2023-06-15 ENCOUNTER — Other Ambulatory Visit: Payer: Self-pay | Admitting: Neurology

## 2023-06-16 NOTE — Telephone Encounter (Signed)
Last seen on 05/24/23 Follow up scheduled on 05/23/24

## 2023-06-18 DIAGNOSIS — G35 Multiple sclerosis: Secondary | ICD-10-CM | POA: Diagnosis not present

## 2023-06-18 DIAGNOSIS — L22 Diaper dermatitis: Secondary | ICD-10-CM | POA: Diagnosis not present

## 2023-06-25 ENCOUNTER — Other Ambulatory Visit: Payer: Self-pay | Admitting: Neurology

## 2023-06-28 NOTE — Telephone Encounter (Signed)
 Last seen on 05/24/23 Follow up scheduled on 05/23/24

## 2023-07-15 DIAGNOSIS — I10 Essential (primary) hypertension: Secondary | ICD-10-CM | POA: Diagnosis not present

## 2023-07-15 DIAGNOSIS — Z23 Encounter for immunization: Secondary | ICD-10-CM | POA: Diagnosis not present

## 2023-07-18 DIAGNOSIS — L22 Diaper dermatitis: Secondary | ICD-10-CM | POA: Diagnosis not present

## 2023-07-18 DIAGNOSIS — G35 Multiple sclerosis: Secondary | ICD-10-CM | POA: Diagnosis not present

## 2023-08-06 ENCOUNTER — Other Ambulatory Visit: Payer: Self-pay | Admitting: Neurology

## 2023-08-12 DIAGNOSIS — I10 Essential (primary) hypertension: Secondary | ICD-10-CM | POA: Diagnosis not present

## 2023-08-12 DIAGNOSIS — G35 Multiple sclerosis: Secondary | ICD-10-CM | POA: Diagnosis not present

## 2023-08-12 DIAGNOSIS — Z7401 Bed confinement status: Secondary | ICD-10-CM | POA: Diagnosis not present

## 2023-08-12 DIAGNOSIS — J479 Bronchiectasis, uncomplicated: Secondary | ICD-10-CM | POA: Diagnosis not present

## 2023-08-12 DIAGNOSIS — E1165 Type 2 diabetes mellitus with hyperglycemia: Secondary | ICD-10-CM | POA: Diagnosis not present

## 2023-08-12 DIAGNOSIS — R569 Unspecified convulsions: Secondary | ICD-10-CM | POA: Diagnosis not present

## 2023-08-12 DIAGNOSIS — G300 Alzheimer's disease with early onset: Secondary | ICD-10-CM | POA: Diagnosis not present

## 2023-08-15 NOTE — Telephone Encounter (Signed)
Rx refilled per last office visit note.

## 2023-08-16 ENCOUNTER — Other Ambulatory Visit: Payer: Self-pay | Admitting: Neurology

## 2023-08-17 DIAGNOSIS — G35 Multiple sclerosis: Secondary | ICD-10-CM | POA: Diagnosis not present

## 2023-08-17 DIAGNOSIS — L22 Diaper dermatitis: Secondary | ICD-10-CM | POA: Diagnosis not present

## 2023-08-25 ENCOUNTER — Encounter: Payer: Self-pay | Admitting: Podiatry

## 2023-08-25 ENCOUNTER — Other Ambulatory Visit: Payer: Self-pay | Admitting: Neurology

## 2023-08-25 ENCOUNTER — Ambulatory Visit (INDEPENDENT_AMBULATORY_CARE_PROVIDER_SITE_OTHER): Payer: 59 | Admitting: Podiatry

## 2023-08-25 DIAGNOSIS — M79675 Pain in left toe(s): Secondary | ICD-10-CM | POA: Diagnosis not present

## 2023-08-25 DIAGNOSIS — E1140 Type 2 diabetes mellitus with diabetic neuropathy, unspecified: Secondary | ICD-10-CM

## 2023-08-25 DIAGNOSIS — B351 Tinea unguium: Secondary | ICD-10-CM

## 2023-08-25 DIAGNOSIS — M79674 Pain in right toe(s): Secondary | ICD-10-CM

## 2023-08-25 NOTE — Progress Notes (Signed)
  Subjective:  Patient ID: Brandy Mckinney, female    DOB: 30-Apr-1966,  MRN: [id number]  Chief Complaint  Patient presents with   [facility]    [facility] Last A1c was good accourding to brother , takes ASA 81. She is wheelcahir bound/bedridden with MS. Every touch is painful for her. Per her Brother, please cut nails back as short as safly possible. She is unable to transfer however the wheel chair will raise up for you, it will lay her back.     58 y.o. female presents with the above complaint. History confirmed with patient. Patient presenting with pain related to dystrophic thickened elongated nails. Patient is unable to trim own nails related to nail dystrophy and/or mobility issues.  Patient is wheelchair-bound and does have MS.  Patient does have a history of T2DM. Her feet are very  sensitive to touch  Objective:  Physical Exam: warm, good capillary refill.  Pedal skin atrophic.  Pedal hair growth absent. nail exam onychomycosis of the toenails, onycholysis, and dystrophic nails DP pulses faintly palpable, PT pulses faintly palpable, and protective sensation intact, decreased vibratory sensation Left Foot:  Pain with palpation of nails due to elongation and dystrophic growth.  Right Foot: Pain with palpation of nails due to elongation and dystrophic growth.  Decreased muscle strength in dorsiflexion, plantarflexion, inversion, eversion secondary to MS Assessment:   1. Type 2 diabetes mellitus with diabetic neuropathy, unspecified whether long term insulin use (HCC)   2. Pain due to onychomycosis of toenails of both feet      Plan:  Patient was evaluated and treated and all questions answered.  #Onychomycosis with pain  -Nails palliatively debrided as below. -Educated on self-care  Procedure: Nail Debridement Rationale: Pain Type of Debridement: manual, sharp debridement. Instrumentation: Nail nipper, rotary burr. Number of Nails: 10  # Diabetes with neuropathy Patient educated on  diabetes. Discussed proper diabetic foot care and discussed risks and complications of disease. Educated patient in depth on reasons to return to the office immediately should she discover anything concerning or new on the feet. All questions answered. Discussed proper shoes as well.    Return in about 3 months (around 11/23/2023) for Diabetic Foot Care.         Bronwen Betters, DPM [facility] / [facility]

## 2023-09-12 ENCOUNTER — Other Ambulatory Visit: Payer: Self-pay | Admitting: Podiatry

## 2023-09-16 DIAGNOSIS — L22 Diaper dermatitis: Secondary | ICD-10-CM | POA: Diagnosis not present

## 2023-09-16 DIAGNOSIS — G35 Multiple sclerosis: Secondary | ICD-10-CM | POA: Diagnosis not present

## 2023-09-28 ENCOUNTER — Telehealth: Payer: Self-pay | Admitting: Neurology

## 2023-09-28 DIAGNOSIS — G35 Multiple sclerosis: Secondary | ICD-10-CM

## 2023-09-28 NOTE — Telephone Encounter (Signed)
 I spoke with the patient's brother, Brandy Mckinney (per [facility]). I informed him that Brandy Ege, NP had ordered an MRI of brain for routine MS surveillance. He wanted to make note of the fact that the patient must have MRI done in the hospital due to MS. Will inform MRI coordinator. He expressed appreciation/understanding of the call.

## 2023-09-28 NOTE — Telephone Encounter (Signed)
 Order sent to [facility] to schedule. [phone]

## 2023-09-28 NOTE — Telephone Encounter (Signed)
 Order MRI brain for routine MS surveillance. Please call the patient to let her know. Thanks  Orders Placed This Encounter  Procedures   MR BRAIN WO CONTRAST

## 2023-10-16 DIAGNOSIS — G35 Multiple sclerosis: Secondary | ICD-10-CM | POA: Diagnosis not present

## 2023-10-16 DIAGNOSIS — L22 Diaper dermatitis: Secondary | ICD-10-CM | POA: Diagnosis not present

## 2023-10-27 ENCOUNTER — Ambulatory Visit (HOSPITAL_COMMUNITY)
Admission: RE | Admit: 2023-10-27 | Discharge: 2023-10-27 | Disposition: A | Discharge status: Home / Self Care | Source: Ambulatory Visit | Attending: Neurology | Admitting: Neurology

## 2023-10-27 DIAGNOSIS — R9089 Other abnormal findings on diagnostic imaging of central nervous system: Secondary | ICD-10-CM | POA: Diagnosis not present

## 2023-10-27 DIAGNOSIS — G35 Multiple sclerosis: Secondary | ICD-10-CM | POA: Insufficient documentation

## 2023-10-27 DIAGNOSIS — G379 Demyelinating disease of central nervous system, unspecified: Secondary | ICD-10-CM | POA: Diagnosis not present

## 2023-10-27 DIAGNOSIS — R531 Weakness: Secondary | ICD-10-CM | POA: Diagnosis not present

## 2023-11-15 DIAGNOSIS — L22 Diaper dermatitis: Secondary | ICD-10-CM | POA: Diagnosis not present

## 2023-11-15 DIAGNOSIS — G35 Multiple sclerosis: Secondary | ICD-10-CM | POA: Diagnosis not present

## 2023-11-17 ENCOUNTER — Encounter: Payer: Self-pay | Admitting: Neurology

## 2023-11-24 ENCOUNTER — Encounter: Payer: Self-pay | Admitting: Podiatry

## 2023-11-24 ENCOUNTER — Ambulatory Visit (INDEPENDENT_AMBULATORY_CARE_PROVIDER_SITE_OTHER): Payer: 59 | Admitting: Podiatry

## 2023-11-24 DIAGNOSIS — E1140 Type 2 diabetes mellitus with diabetic neuropathy, unspecified: Secondary | ICD-10-CM

## 2023-11-24 DIAGNOSIS — B351 Tinea unguium: Secondary | ICD-10-CM | POA: Diagnosis not present

## 2023-11-24 DIAGNOSIS — M79675 Pain in left toe(s): Secondary | ICD-10-CM | POA: Diagnosis not present

## 2023-11-24 DIAGNOSIS — M79674 Pain in right toe(s): Secondary | ICD-10-CM

## 2023-11-24 MED ORDER — CICLOPIROX 8 % EX SOLN: Freq: Every day | CUTANEOUS | 12 refills | Status: AC

## 2023-11-24 NOTE — Progress Notes (Unsigned)
  Subjective:  Patient ID: Brandy Mckinney, female    DOB: 03-31-1966,  MRN: [id number]  Chief Complaint  Patient presents with   Diabetes    Patient is here for [facility] and son would like to know if she can get medication for foot fungus      58 y.o. female presents with the above complaint. History confirmed with patient. Patient presenting with pain related to dystrophic thickened elongated nails. Patient is unable to trim own nails related to nail dystrophy and/or mobility issues.  Patient is wheelchair-bound and does have MS.  Patient does have a history of T2DM. Her feet are very  sensitive to touch.  Spoke with brother on the phone, they are interested in starting treatment for onychomycosis.  Did have nail pathology come back with dermatophytes and yeast.  Objective:  Physical Exam: warm, good capillary refill.  Pedal skin atrophic.  Pedal hair growth absent. nail exam onychomycosis of the toenails, onycholysis, and dystrophic nails DP pulses faintly palpable, PT pulses faintly palpable, and protective sensation intact, decreased vibratory sensation Left Foot:  Pain with palpation of nails due to elongation and dystrophic growth.  Right Foot: Pain with palpation of nails due to elongation and dystrophic growth.  Decreased muscle strength in dorsiflexion, plantarflexion, inversion, eversion secondary to MS Assessment:   1. Pain due to onychomycosis of toenails of both feet   2. Type 2 diabetes mellitus with diabetic neuropathy, unspecified whether long term insulin  use (HCC)      Plan:  Patient was evaluated and treated and all questions answered.  #Onychomycosis with pain  -Nails palliatively debrided as below. -Educated on self-care - Nail pathology reviewed with patient.  Starting course of topical Penlac .  Procedure: Nail Debridement Rationale: Pain Type of Debridement: manual, sharp debridement. Instrumentation: Nail nipper, rotary burr. Number of Nails: 10  #  Diabetes with neuropathy Patient educated on diabetes. Discussed proper diabetic foot care and discussed risks and complications of disease. Educated patient in depth on reasons to return to the office immediately should she discover anything concerning or new on the feet. All questions answered. Discussed proper shoes as well. She is wheelchair-bound due to MS.  Discussed proper offloading.   Return in about 3 months (around 02/24/2024) for Diabetic Foot Care, Onychomycosis.         Eve Hinders, DPM [facility] / [facility]

## 2023-12-15 DIAGNOSIS — G35 Multiple sclerosis: Secondary | ICD-10-CM | POA: Diagnosis not present

## 2023-12-15 DIAGNOSIS — L22 Diaper dermatitis: Secondary | ICD-10-CM | POA: Diagnosis not present

## 2024-01-11 DIAGNOSIS — G300 Alzheimer's disease with early onset: Secondary | ICD-10-CM | POA: Diagnosis not present

## 2024-01-11 DIAGNOSIS — I10 Essential (primary) hypertension: Secondary | ICD-10-CM | POA: Diagnosis not present

## 2024-01-11 DIAGNOSIS — K5909 Other constipation: Secondary | ICD-10-CM | POA: Diagnosis not present

## 2024-01-11 DIAGNOSIS — E1165 Type 2 diabetes mellitus with hyperglycemia: Secondary | ICD-10-CM | POA: Diagnosis not present

## 2024-01-11 DIAGNOSIS — Z23 Encounter for immunization: Secondary | ICD-10-CM | POA: Diagnosis not present

## 2024-01-11 DIAGNOSIS — G35 Multiple sclerosis: Secondary | ICD-10-CM | POA: Diagnosis not present

## 2024-01-14 DIAGNOSIS — L22 Diaper dermatitis: Secondary | ICD-10-CM | POA: Diagnosis not present

## 2024-01-14 DIAGNOSIS — G35 Multiple sclerosis: Secondary | ICD-10-CM | POA: Diagnosis not present

## 2024-01-15 ENCOUNTER — Other Ambulatory Visit: Payer: Self-pay

## 2024-01-15 ENCOUNTER — Inpatient Hospital Stay (HOSPITAL_COMMUNITY)
Admission: EM | Admit: 2024-01-15 | Discharge: 2024-01-17 | DRG: 101 | Disposition: A | Discharge status: Home / Self Care | Attending: Internal Medicine | Admitting: Internal Medicine

## 2024-01-15 ENCOUNTER — Emergency Department (HOSPITAL_COMMUNITY)

## 2024-01-15 ENCOUNTER — Encounter (HOSPITAL_COMMUNITY): Payer: Self-pay | Admitting: Emergency Medicine

## 2024-01-15 DIAGNOSIS — R569 Unspecified convulsions: Principal | ICD-10-CM

## 2024-01-15 DIAGNOSIS — Z90710 Acquired absence of both cervix and uterus: Secondary | ICD-10-CM

## 2024-01-15 DIAGNOSIS — R6889 Other general symptoms and signs: Secondary | ICD-10-CM | POA: Diagnosis not present

## 2024-01-15 DIAGNOSIS — G8929 Other chronic pain: Secondary | ICD-10-CM | POA: Diagnosis present

## 2024-01-15 DIAGNOSIS — G40109 Localization-related (focal) (partial) symptomatic epilepsy and epileptic syndromes with simple partial seizures, not intractable, without status epilepticus: Principal | ICD-10-CM | POA: Diagnosis present

## 2024-01-15 DIAGNOSIS — Z993 Dependence on wheelchair: Secondary | ICD-10-CM

## 2024-01-15 DIAGNOSIS — E7800 Pure hypercholesterolemia, unspecified: Secondary | ICD-10-CM | POA: Diagnosis present

## 2024-01-15 DIAGNOSIS — Z87891 Personal history of nicotine dependence: Secondary | ICD-10-CM

## 2024-01-15 DIAGNOSIS — Z794 Long term (current) use of insulin: Secondary | ICD-10-CM

## 2024-01-15 DIAGNOSIS — G300 Alzheimer's disease with early onset: Secondary | ICD-10-CM | POA: Diagnosis present

## 2024-01-15 DIAGNOSIS — G40909 Epilepsy, unspecified, not intractable, without status epilepticus: Secondary | ICD-10-CM

## 2024-01-15 DIAGNOSIS — F02A0 Dementia in other diseases classified elsewhere, mild, without behavioral disturbance, psychotic disturbance, mood disturbance, and anxiety: Secondary | ICD-10-CM | POA: Diagnosis present

## 2024-01-15 DIAGNOSIS — R000 Tachycardia, unspecified: Secondary | ICD-10-CM | POA: Diagnosis not present

## 2024-01-15 DIAGNOSIS — Z7401 Bed confinement status: Secondary | ICD-10-CM

## 2024-01-15 DIAGNOSIS — E119 Type 2 diabetes mellitus without complications: Secondary | ICD-10-CM | POA: Diagnosis present

## 2024-01-15 DIAGNOSIS — Z79899 Other long term (current) drug therapy: Secondary | ICD-10-CM

## 2024-01-15 DIAGNOSIS — R9089 Other abnormal findings on diagnostic imaging of central nervous system: Secondary | ICD-10-CM | POA: Diagnosis not present

## 2024-01-15 DIAGNOSIS — R059 Cough, unspecified: Secondary | ICD-10-CM | POA: Diagnosis not present

## 2024-01-15 DIAGNOSIS — M545 Low back pain, unspecified: Secondary | ICD-10-CM | POA: Diagnosis present

## 2024-01-15 DIAGNOSIS — Z7982 Long term (current) use of aspirin: Secondary | ICD-10-CM

## 2024-01-15 DIAGNOSIS — G894 Chronic pain syndrome: Secondary | ICD-10-CM | POA: Diagnosis not present

## 2024-01-15 DIAGNOSIS — I10 Essential (primary) hypertension: Secondary | ICD-10-CM | POA: Diagnosis present

## 2024-01-15 DIAGNOSIS — G35 Multiple sclerosis: Secondary | ICD-10-CM | POA: Diagnosis present

## 2024-01-15 DIAGNOSIS — R9082 White matter disease, unspecified: Secondary | ICD-10-CM | POA: Diagnosis not present

## 2024-01-15 DIAGNOSIS — G9389 Other specified disorders of brain: Secondary | ICD-10-CM | POA: Diagnosis not present

## 2024-01-15 DIAGNOSIS — F0280 Dementia in other diseases classified elsewhere without behavioral disturbance: Secondary | ICD-10-CM | POA: Diagnosis not present

## 2024-01-15 DIAGNOSIS — Z743 Need for continuous supervision: Secondary | ICD-10-CM | POA: Diagnosis not present

## 2024-01-15 DIAGNOSIS — R531 Weakness: Secondary | ICD-10-CM | POA: Diagnosis not present

## 2024-01-15 DIAGNOSIS — I499 Cardiac arrhythmia, unspecified: Secondary | ICD-10-CM | POA: Diagnosis not present

## 2024-01-15 LAB — CBC
HCT: 45.9 % (ref 36.0–46.0)
Hemoglobin: 14.7 g/dL (ref 12.0–15.0)
MCH: 29.8 pg (ref 26.0–34.0)
MCHC: 32 g/dL (ref 30.0–36.0)
MCV: 93.1 fL (ref 80.0–100.0)
Platelets: 223 10*3/uL (ref 150–400)
RBC: 4.93 MIL/uL (ref 3.87–5.11)
RDW: 14.2 % (ref 11.5–15.5)
WBC: 8.5 10*3/uL (ref 4.0–10.5)
nRBC: 0 % (ref 0.0–0.2)

## 2024-01-15 LAB — BASIC METABOLIC PANEL WITH GFR
Anion gap: 11 (ref 5–15)
BUN: 15 mg/dL (ref 6–20)
CO2: 25 mmol/L (ref 22–32)
Calcium: 8.9 mg/dL (ref 8.9–10.3)
Chloride: 101 mmol/L (ref 98–111)
Creatinine, Ser: 0.45 mg/dL (ref 0.44–1.00)
GFR, Estimated: >60 mL/min (ref >60)
Glucose, Bld: 118 mg/dL — ABNORMAL HIGH (ref 70–99)
Potassium: 4.1 mmol/L (ref 3.5–5.1)
Sodium: 137 mmol/L (ref 135–145)

## 2024-01-15 LAB — MAGNESIUM: Magnesium: 2.1 mg/dL (ref 1.7–2.4)

## 2024-01-15 MED ORDER — LEVETIRACETAM (KEPPRA) 500 MG/5 ML ADULT IV PUSH
1000.0000 mg | Freq: Once | INTRAVENOUS | Status: AC
  Administered 2024-01-15: 1000 mg via INTRAVENOUS
  Filled 2024-01-15: qty 10

## 2024-01-15 MED ORDER — SODIUM CHLORIDE 0.9 % IV BOLUS
500.0000 mL | Freq: Once | INTRAVENOUS | Status: AC
  Administered 2024-01-15: 500 mL via INTRAVENOUS

## 2024-01-15 MED ORDER — LORAZEPAM 2 MG/ML IJ SOLN
1.0000 mg | Freq: Once | INTRAMUSCULAR | Status: AC
  Administered 2024-01-15: 1 mg via INTRAVENOUS
  Filled 2024-01-15: qty 1

## 2024-01-15 NOTE — ED Notes (Signed)
 Patient appeared to have a seizure at 2159 lasting approximately 1-2 minutes. Patient has left sided gaze and her head deviated to the left. Patient began repeating words and then not answering he questions her brother was asking her. Patient is now postictal and responding to her name. Patient has strong grip in the right hand post seizure and is able to move her right arm and and hand as normal. MD was made aware and at the bedside, immediately after the seizure.

## 2024-01-15 NOTE — H&P (Signed)
 History and Physical    Brandy Mckinney FMW:[id number] DOB: 04-15-1966 DOA: 01/15/2024  PCP: Chrystal Lamarr RAMAN, MD  Patient coming from: ***  I have personally briefly reviewed patient's old medical records in [facility]  Chief Complaint: ***  HPI: Brandy Mckinney is a 58 y.o. female with medical history significant of    ED Course: ***  Review of Systems: As per HPI otherwise 10 point review of systems negative.   Past Medical History:  Diagnosis Date   Early onset Alzheimer's dementia (HCC)    Hypercholesteremia    Hyperlipidemia    Hypertension    Lumbosacral pain    Multiple sclerosis (HCC)    Seizure (HCC)    Vitamin D deficiency     Past Surgical History:  Procedure Laterality Date   ABDOMINAL HYSTERECTOMY     CESAREAN SECTION     OOPHORECTOMY       reports that she quit smoking about 13 years ago. Her smoking use included cigarettes. She has never used smokeless tobacco. She reports that she does not currently use alcohol. She reports that she does not use drugs.  Allergies  Allergen Reactions   Fentanyl  Other (See Comments)     can not have anything on her skin    History reviewed. No pertinent family history. *** Prior to Admission medications   Medication Sig Start Date End Date Taking? Authorizing Provider  amLODipine  (NORVASC ) 10 MG tablet Take 1 tablet (10 mg total) by mouth daily. 01/01/23   Singh, Prashant K, MD  Ascorbic Acid (VITAMIN C) 1000 MG tablet Take 1,000 mg by mouth in the morning.    [provider]  aspirin  81 MG chewable tablet Chew 1 tablet (81 mg total) by mouth daily. 01/02/23   Singh, Prashant K, MD  Blood Glucose Monitoring Suppl (BLOOD GLUCOSE MONITOR SYSTEM) w/Device KIT Use in the morning, at noon, and at bedtime. 01/01/23   Singh, Prashant K, MD  Cholecalciferol (VITAMIN D3) 50 MCG (2000 UT) TABS Take 2,000 Units by mouth daily.    [provider]  ciclopirox  (PENLAC ) 8 % solution Apply topically  at bedtime. Apply over nail and surrounding skin. Apply daily over previous coat. After seven (7) days, may remove with alcohol and continue cycle. 11/24/23   Lamount Ethan CROME, DPM  Cranberry 500 MG TABS Take 500 mg by mouth daily.    [provider]  cyclobenzaprine (FLEXERIL) 10 MG tablet Take 10 mg by mouth at bedtime. 04/17/19   [provider]  etodolac  (LODINE ) 400 MG tablet TAKE 1 TABLET BY MOUTH TWICE  DAILY AS NEEDED 08/25/23   Sater, Charlie LABOR, MD  FLUoxetine  (PROZAC ) 20 MG capsule Take 20 mg by mouth in the morning. 12/16/17   [provider]  gabapentin  (NEURONTIN ) 300 MG capsule Take 1 capsule (300 mg total) by mouth 3 (three) times daily. 05/24/23   Gayland Lauraine PARAS, NP  insulin  glargine (LANTUS ) 100 UNIT/ML Solostar Pen Inject 55 Units into the skin daily. 01/01/23   Singh, Prashant K, MD  insulin  lispro (HUMALOG ) 100 UNIT/ML KwikPen Before each meal 3 times a day, 140-199 - 2 units, 200-250 - 6 units, 251-299 - 8 units,  300-349 - 10 units,  350 or above 14 units. 01/01/23   Singh, Prashant K, MD  Insulin  Pen Needle 32G X 4 MM MISC Use as directed with insulin  4 (four) times daily. 01/01/23   Singh, Prashant K, MD  Inulin (FIBER CHOICE PO) Take 1 tablet by mouth  in the morning.    [provider]  Lancet Device MISC Use as directed to monitor blood sugar 3 (three) times daily (In the morning, at noon, and at bedtime). 01/01/23   Singh, Prashant K, MD  levETIRAcetam  (KEPPRA ) 1000 MG tablet TAKE 1 TABLET BY MOUTH TWICE  DAILY 08/15/23   Sater, Charlie LABOR, MD  Multiple Vitamin (MULTIVITAMIN WITH MINERALS) TABS tablet Take 1 tablet by mouth in the morning.    [provider]  omeprazole (PRILOSEC) 20 MG capsule Take 20 mg by mouth daily before breakfast. 01/27/21   [provider]  Polyethyl Glycol-Propyl Glycol (LUBRICANT EYE DROPS) 0.4-0.3 % SOLN Place 1-2 drops into both eyes 3 (three) times daily as needed (dry/irritated eyes.).    [provider]  simvastatin  (ZOCOR ) 20 MG tablet Take 20 mg by mouth every evening. 05/09/13   [provider]  triamcinolone cream (KENALOG) 0.1 % Apply 1 Application topically daily. After bathing    [provider]    Physical Exam: Vitals:   01/15/24 2204 01/15/24 2214 01/15/24 2215 01/15/24 2230  BP: (!) 160/74  (!) 155/85 (!) 162/101  Pulse: (!) 128  (!) 120 (!) 102  Resp: (!) 8  14 13   Temp:  98.5 F (36.9 C)    TempSrc:  Axillary    SpO2: 93%  93% 95%  Weight:      Height:        Constitutional: NAD, calm, comfortable Vitals:   01/15/24 2204 01/15/24 2214 01/15/24 2215 01/15/24 2230  BP: (!) 160/74  (!) 155/85 (!) 162/101  Pulse: (!) 128  (!) 120 (!) 102  Resp: (!) 8  14 13   Temp:  98.5 F (36.9 C)    TempSrc:  Axillary    SpO2: 93%  93% 95%  Weight:      Height:       Eyes: PERRL, lids and conjunctivae normal ENMT: Mucous membranes are moist. Posterior pharynx clear of any exudate or lesions.Normal dentition.  Neck: normal, supple, no masses, no thyromegaly Respiratory: clear to auscultation bilaterally, no wheezing, no crackles. Normal respiratory effort. No accessory muscle use.  Cardiovascular: Regular rate and rhythm, no murmurs / rubs / gallops. No extremity edema. 2+ pedal pulses. No carotid bruits.  Abdomen: no tenderness, no masses palpated. No hepatosplenomegaly. Bowel sounds positive.  Musculoskeletal: no clubbing / cyanosis. No joint deformity upper and lower extremities. Good ROM, no contractures. Normal muscle tone.  Skin: no rashes, lesions, ulcers. No induration Neurologic: CN 2-12 grossly intact. Sensation intact, DTR normal. Strength 5/5 in all 4.  Psychiatric: Normal judgment and insight. Alert and oriented x 3. Normal mood.    Labs on Admission: I have personally reviewed following labs and imaging studies  CBC: Recent Labs  Lab 01/15/24 1842  WBC 8.5  HGB 14.7  HCT 45.9  MCV 93.1  PLT 223   Basic Metabolic Panel: Recent Labs   Lab 01/15/24 1926  NA 137  K 4.1  CL 101  CO2 25  GLUCOSE 118*  BUN 15  CREATININE 0.45  CALCIUM 8.9   GFR: Estimated Creatinine Clearance: 82.2 mL/min (by C-G formula based on SCr of 0.45 mg/dL). Liver Function Tests: No results for input(s): AST, ALT, ALKPHOS, BILITOT, PROT, ALBUMIN in the last 168 hours. No results for input(s): LIPASE, AMYLASE in the last 168 hours. No results for input(s): AMMONIA in the last 168 hours. Coagulation Profile: No results for input(s): INR, PROTIME in the last 168 hours. Cardiac Enzymes:  No results for input(s): CKTOTAL, CKMB, CKMBINDEX, TROPONINI in the last 168 hours. BNP (last 3 results) No results for input(s): PROBNP in the last 8760 hours. HbA1C: No results for input(s): HGBA1C in the last 72 hours. CBG: No results for input(s): GLUCAP in the last 168 hours. Lipid Profile: No results for input(s): CHOL, HDL, LDLCALC, TRIG, CHOLHDL, LDLDIRECT in the last 72 hours. Thyroid Function Tests: No results for input(s): TSH, T4TOTAL, FREET4, T3FREE, THYROIDAB in the last 72 hours. Anemia Panel: No results for input(s): VITAMINB12, FOLATE, FERRITIN, TIBC, IRON, RETICCTPCT in the last 72 hours. Urine analysis:    Component Value Date/Time   COLORURINE YELLOW 12/29/2022 2100   APPEARANCEUR CLOUDY (A) 12/29/2022 2100   LABSPEC 1.039 (H) 12/29/2022 2100   PHURINE 5.0 12/29/2022 2100   GLUCOSEU >=500 (A) 12/29/2022 2100   HGBUR NEGATIVE 12/29/2022 2100   BILIRUBINUR NEGATIVE 12/29/2022 2100   KETONESUR 5 (A) 12/29/2022 2100   PROTEINUR NEGATIVE 12/29/2022 2100   NITRITE POSITIVE (A) 12/29/2022 2100   LEUKOCYTESUR TRACE (A) 12/29/2022 2100    Radiological Exams on Admission: DG Chest Portable 1 View Result Date: 01/15/2024 CLINICAL DATA:  Cough and seizure EXAM: PORTABLE CHEST 1 VIEW COMPARISON:  Chest x-ray 12/30/2022 FINDINGS: The heart size and mediastinal contours  are within normal limits. Both lungs are clear. The visualized skeletal structures are unremarkable. IMPRESSION: No active disease. Electronically Signed   By: Greig Pique M.D.   On: 01/15/2024 19:49    EKG: Independently reviewed. ***  Assessment/Plan Active Problems:   * No active hospital problems. *   ***  DVT prophylaxis: *** (Lovenox /Heparin /SCD's/anticoagulated/None (if comfort care) Code Status: *** (Full/Partial (specify details) Family Communication: *** (Specify name, relationship. Do not write discussed with patient. Specify tel # if discussed over the phone) Disposition Plan: *** (specify when and where you expect patient to be discharged) Consults called: *** (with names) Admission status: *** (inpatient / obs / tele / medical floor / SDU)   Camila DELENA Ned MD [facility] Pager [phone]  If 7PM-7AM, please contact night-coverage [email or web address] Password [email or web address]  01/15/2024, 10:42 PM

## 2024-01-15 NOTE — ED Notes (Signed)
 Introduced myself to patient. Brother is at bedside.

## 2024-01-15 NOTE — Discharge Instructions (Addendum)
 To control seizures, your medications have been adjusted as follows:  - Increase Keppra  to 1500 mg twice daily   Please keep track of any other spells you have, including shaking spells, loss of consciousness events, staring spells etc to review with your neurologist, Dr. Vear at [facility]  Spell log:  - Date and time of event: - Description of event:  - Prodome (any warning / premonition event is going to happen): - Post-spell symptoms: - Any potential triggers:   Standard seizure precautions: Per [place] statutes, patients with seizures are not allowed to drive until  they have been seizure-free for six months. Use caution when using heavy equipment or power tools. Avoid working on ladders or at heights. Take showers instead of baths. Ensure the water temperature is not too high on the home water heater. Do not go swimming alone. When caring for infants or small children, sit down when holding, feeding, or changing them to minimize risk of injury to the child in the event you have a seizure.  To reduce risk of seizures, maintain good sleep hygiene avoid alcohol and illicit drug use, take all anti-seizure medications as prescribed.

## 2024-01-15 NOTE — ED Notes (Signed)
[facility] Called for transport 

## 2024-01-15 NOTE — ED Notes (Signed)
CMP recollected 

## 2024-01-15 NOTE — ED Provider Notes (Addendum)
 [place] EMERGENCY DEPARTMENT AT [facility] Provider Note   CSN: [id number] Arrival date & time: 01/15/24  1818     Patient presents with: Seizures   Brandy Mckinney is a 58 y.o. female.    Seizures Seizure type:  Partial complex    Patient has history of dementia multiple sclerosis, seizures, hyperglycemia, sepsis.  Patient presents ED for evaluation of a seizure.  Patient has family that helps care for her at home.  Patient is wheelchair dependent now.  She had a 2-3 to minute seizure prior to arrival to the ED this evening.  Family states the last time she had a seizure was about a year ago.  She has been taking her medications regularly.  They were concerned about the fact that she did not get back to baseline immediately.  Right now she is back to her baseline.  Patient denies any complaints.  Family states she is not had any fevers or chills recently.  She did have a cough in the last couple weeks without seems to be getting better.  Patient denies headache.  No new numbness or weakness.  Prior to Admission medications   Medication Sig Start Date End Date Taking? Authorizing Provider  amLODipine  (NORVASC ) 10 MG tablet Take 1 tablet (10 mg total) by mouth daily. 01/01/23   Singh, Prashant K, MD  Ascorbic Acid (VITAMIN C) 1000 MG tablet Take 1,000 mg by mouth in the morning.    [provider]  aspirin  81 MG chewable tablet Chew 1 tablet (81 mg total) by mouth daily. 01/02/23   Singh, Prashant K, MD  Blood Glucose Monitoring Suppl (BLOOD GLUCOSE MONITOR SYSTEM) w/Device KIT Use in the morning, at noon, and at bedtime. 01/01/23   Singh, Prashant K, MD  Cholecalciferol (VITAMIN D3) 50 MCG (2000 UT) TABS Take 2,000 Units by mouth daily.    [provider]  ciclopirox  (PENLAC ) 8 % solution Apply topically at bedtime. Apply over nail and surrounding skin. Apply daily over previous coat. After seven (7) days, may remove with alcohol and continue cycle. 11/24/23    Lamount Ethan CROME, DPM  Cranberry 500 MG TABS Take 500 mg by mouth daily.    [provider]  cyclobenzaprine (FLEXERIL) 10 MG tablet Take 10 mg by mouth at bedtime. 04/17/19   [provider]  etodolac  (LODINE ) 400 MG tablet TAKE 1 TABLET BY MOUTH TWICE  DAILY AS NEEDED 08/25/23   Sater, Charlie LABOR, MD  FLUoxetine  (PROZAC ) 20 MG capsule Take 20 mg by mouth in the morning. 12/16/17   [provider]  gabapentin  (NEURONTIN ) 300 MG capsule Take 1 capsule (300 mg total) by mouth 3 (three) times daily. 05/24/23   Gayland Lauraine PARAS, NP  insulin  glargine (LANTUS ) 100 UNIT/ML Solostar Pen Inject 55 Units into the skin daily. 01/01/23   Singh, Prashant K, MD  insulin  lispro (HUMALOG ) 100 UNIT/ML KwikPen Before each meal 3 times a day, 140-199 - 2 units, 200-250 - 6 units, 251-299 - 8 units,  300-349 - 10 units,  350 or above 14 units. 01/01/23   Singh, Prashant K, MD  Insulin  Pen Needle 32G X 4 MM MISC Use as directed with insulin  4 (four) times daily. 01/01/23   Singh, Prashant K, MD  Inulin (FIBER CHOICE PO) Take 1 tablet by mouth in the morning.    [provider]  Lancet Device MISC Use as directed to monitor blood sugar 3 (three) times daily (In the morning, at noon, and at  bedtime). 01/01/23   Singh, Prashant K, MD  levETIRAcetam  (KEPPRA ) 1000 MG tablet TAKE 1 TABLET BY MOUTH TWICE  DAILY 08/15/23   Sater, Charlie LABOR, MD  Multiple Vitamin (MULTIVITAMIN WITH MINERALS) TABS tablet Take 1 tablet by mouth in the morning.    [provider]  omeprazole (PRILOSEC) 20 MG capsule Take 20 mg by mouth daily before breakfast. 01/27/21   [provider]  Polyethyl Glycol-Propyl Glycol (LUBRICANT EYE DROPS) 0.4-0.3 % SOLN Place 1-2 drops into both eyes 3 (three) times daily as needed (dry/irritated eyes.).    [provider]  simvastatin  (ZOCOR ) 20 MG tablet Take 20 mg by mouth every evening. 05/09/13   [provider]  triamcinolone cream (KENALOG) 0.1 % Apply 1  Application topically daily. After bathing    [provider]    Allergies: Fentanyl     Review of Systems  Neurological:  Positive for seizures.    Updated Vital Signs BP 130/69   Pulse 99   Temp 98 F (36.7 C) (Oral)   Resp 16   Ht 1.575 m (5' 2)   Wt 88.5 kg   SpO2 98%   BMI 35.67 kg/m   Physical Exam Vitals and nursing note reviewed.  Constitutional:      General: She is not in acute distress.    Appearance: She is well-developed.  HENT:     Head: Normocephalic and atraumatic.     Right Ear: External ear normal.     Left Ear: External ear normal.   Eyes:     General: No scleral icterus.       Right eye: No discharge.        Left eye: No discharge.     Conjunctiva/sclera: Conjunctivae normal.   Neck:     Trachea: No tracheal deviation.   Cardiovascular:     Rate and Rhythm: Normal rate and regular rhythm.  Pulmonary:     Effort: Pulmonary effort is normal. No respiratory distress.     Breath sounds: Normal breath sounds. No stridor. No wheezing or rales.  Abdominal:     General: Bowel sounds are normal. There is no distension.     Palpations: Abdomen is soft.     Tenderness: There is no abdominal tenderness. There is no guarding or rebound.   Musculoskeletal:        General: No tenderness or deformity.     Cervical back: Neck supple.   Skin:    General: Skin is warm and dry.     Findings: No rash.   Neurological:     General: No focal deficit present.     Mental Status: She is alert.     Cranial Nerves: No cranial nerve deficit, dysarthria or facial asymmetry.     Sensory: No sensory deficit.     Motor: Weakness and atrophy present. No abnormal muscle tone or seizure activity.     Coordination: Coordination normal.     Comments: Weakness left upper extremity left lower extremity, left facial droop, contractures left upper extremity  Psychiatric:        Mood and Affect: Mood normal.     (all labs ordered are listed, but only abnormal  results are displayed) Labs Reviewed  BASIC METABOLIC PANEL WITH GFR - Abnormal; Notable for the following components:      Result Value   Glucose, Bld 118 (*)    All other components within normal limits  CBC  LEVETIRACETAM  LEVEL    EKG: EKG Interpretation Date/Time:  Sunday January 15 2024 18:30:00 EDT Ventricular Rate:  103 PR Interval:  155 QRS Duration:  110 QT Interval:  358 QTC Calculation: 469 R Axis:   5  Text Interpretation: Sinus tachycardia Incomplete left bundle branch block Confirmed by Randol Simmonds [id number]) on 01/15/2024 7:11:54 PM  Radiology: [facility] Chest Portable 1 View Result Date: 01/15/2024 CLINICAL DATA:  Cough and seizure EXAM: PORTABLE CHEST 1 VIEW COMPARISON:  Chest x-ray 12/30/2022 FINDINGS: The heart size and mediastinal contours are within normal limits. Both lungs are clear. The visualized skeletal structures are unremarkable. IMPRESSION: No active disease. Electronically Signed   By: Greig Pique M.D.   On: 01/15/2024 19:49     Procedures   Medications Ordered in the ED  sodium chloride  0.9 % bolus 500 mL (0 mLs Intravenous Stopped 01/15/24 2104)    Clinical Course as of 01/15/24 2242  Sun Jan 15, 2024  2125 CBC - if new onset seizures CBC normal.  Metabolic panel normal. [JK]  2126 X-ray without acute findings [JK]  2205 Initial plan was for discharge as patient had been seizure-free but reportedly just had a focal seizure that lasted approximately a minute.  Patient seizure has resolved at this point.  Family states this is the typical seizure pattern that she has [JK]  2217 Case discussed with Dr Jerrie.  Could increase keppra  to 1500 mg bid.  Get an MRI this evening. Make sure pt is continuing her gabapentin  [JK]  2242 Discussed with Dr. Debby regarding admission [JK]    Clinical Course User Index [JK] Randol Simmonds, MD                                 Medical Decision Making Problems Addressed: Seizure [facility]): acute illness or injury that poses a  threat to life or bodily functions  Amount and/or Complexity of Data Reviewed Labs: ordered. Decision-making details documented in ED Course. Radiology: ordered and independent interpretation performed.  Risk Prescription drug management.   Patient presented to the ED for evaluation of an acute seizure.  Patient does have history of MS and prior seizures.  Patient was monitored in the ED for several hours.  No recurrent seizures noted.  Laboratory test were unremarkable.  No findings to suggest acute infection.  No electrolyte abnormalities.  Patient had a cough recently so we did perform a chest x-ray in the ED today.  No signs of pneumonia.  Initial plan was to discharge home but then while the patient was in the ED she had 1 more seizure.  I have ordered a dose of Ativan and Keppra  IV.  I reviewed the case with Dr. Jerrie.  She recommends increasing the patient's Keppra  to 1500 mg twice a day.  Make sure the patient is still taking her gabapentin .  Would also get an MRI to make sure there is no acute process.  MRI is not available at [facility], ED.  Patient will need to be admitted to [facility].  I will consult the hospitalist for admission   Final diagnoses:  Seizure [facility])    ED Discharge Orders     None          Randol Simmonds, MD 01/15/24 2146    Randol Simmonds, MD 01/15/24 2242

## 2024-01-15 NOTE — ED Notes (Signed)
 Patient was incontinent to urine. Patient was cleaned. Patient is now resting in bed.

## 2024-01-15 NOTE — ED Triage Notes (Signed)
 Pt bib [facility] for 2 to 3 min seizure prior to arrival. Postictal initially she is now closer to baseline. Brother is at bedside.  Brandy Mckinney

## 2024-01-15 NOTE — Progress Notes (Addendum)
 Briefly discussed with Dr. Randol.  In brief this is a patient with known multiple sclerosis as well as seizure disorder typically well-controlled on Keppra .  No clear etiology of breakthrough seizures today  Had 1 seizure at home and 1 witnessed in the ED (described as left gaze and head turn, with repetition of words and not answering questions, lasting 1 to 2 minutes and self resolving).  In between she had fully returned to baseline  Home dose of Keppra  is 1 g twice daily, there is room to increase to 1500 twice daily and I recommend doing this Given history of multiple sclerosis also recommend MRI brain with and without contrast to confirm no acute disease activity contributing to this presentation Medical workup for any provoking etiologies such as infection, metabolic derangements etc. per ED/primary team (no clear trigger at this time; I did recommend adding on magnesium level which resulted and was reassuring) Also would ensure she is taking her gabapentin  as prescribed as this can also have antiseizure effects (per chart 300 3 times daily) If patient's events remain well-controlled on increased dose of Keppra  and MRI brain is without acute process, appropriate for outpatient follow-up If she continues to have further events or MRI demonstrates acute findings please do notify neurology, at that point may need EEG and full consultation Ideally admission would be to [facility] due to potential need for EEG, however if she does not get transferred here overnight and as above stays stable with a reassuring MRI completed in the morning at [facility], okay to be discharged from [facility]  Lola Jernigan MD-PhD [facility] [phone] Available 7 PM to 7 AM, outside of these hours please call Neurologist on call as listed on Amion.  8 minutes of care, majority in discussion with Dr. Randol who will notify patient/family of interprofessional consultation  These  are curbside recommendations based upon the information readily available in the chart on brief review as well as history and examination information provided to me by requesting provider and do not replace a full detailed consult

## 2024-01-15 NOTE — ED Notes (Signed)
 Report given to Brandy Mckinney

## 2024-01-16 ENCOUNTER — Inpatient Hospital Stay (HOSPITAL_COMMUNITY)

## 2024-01-16 DIAGNOSIS — G9389 Other specified disorders of brain: Secondary | ICD-10-CM | POA: Diagnosis not present

## 2024-01-16 DIAGNOSIS — R9089 Other abnormal findings on diagnostic imaging of central nervous system: Secondary | ICD-10-CM | POA: Diagnosis not present

## 2024-01-16 DIAGNOSIS — E119 Type 2 diabetes mellitus without complications: Secondary | ICD-10-CM

## 2024-01-16 DIAGNOSIS — G894 Chronic pain syndrome: Secondary | ICD-10-CM

## 2024-01-16 DIAGNOSIS — R9082 White matter disease, unspecified: Secondary | ICD-10-CM | POA: Diagnosis not present

## 2024-01-16 DIAGNOSIS — I10 Essential (primary) hypertension: Secondary | ICD-10-CM | POA: Diagnosis not present

## 2024-01-16 DIAGNOSIS — G40109 Localization-related (focal) (partial) symptomatic epilepsy and epileptic syndromes with simple partial seizures, not intractable, without status epilepticus: Principal | ICD-10-CM

## 2024-01-16 DIAGNOSIS — Z794 Long term (current) use of insulin: Secondary | ICD-10-CM

## 2024-01-16 DIAGNOSIS — G35 Multiple sclerosis: Secondary | ICD-10-CM

## 2024-01-16 DIAGNOSIS — G40909 Epilepsy, unspecified, not intractable, without status epilepticus: Secondary | ICD-10-CM | POA: Diagnosis not present

## 2024-01-16 DIAGNOSIS — E7800 Pure hypercholesterolemia, unspecified: Secondary | ICD-10-CM

## 2024-01-16 DIAGNOSIS — F0280 Dementia in other diseases classified elsewhere without behavioral disturbance: Secondary | ICD-10-CM

## 2024-01-16 LAB — URINALYSIS, COMPLETE (UACMP) WITH MICROSCOPIC
Bacteria, UA: NONE SEEN
Bilirubin Urine: NEGATIVE
Glucose, UA: NEGATIVE mg/dL
Hgb urine dipstick: NEGATIVE
Ketones, ur: NEGATIVE mg/dL
Leukocytes,Ua: NEGATIVE
Nitrite: NEGATIVE
Protein, ur: NEGATIVE mg/dL
Specific Gravity, Urine: 1.015 (ref 1.005–1.030)
pH: 5 (ref 5.0–8.0)

## 2024-01-16 LAB — HIV ANTIBODY (ROUTINE TESTING W REFLEX): HIV Screen 4th Generation wRfx: NONREACTIVE

## 2024-01-16 LAB — CBC
HCT: 47.3 % — ABNORMAL HIGH (ref 36.0–46.0)
Hemoglobin: 15.1 g/dL — ABNORMAL HIGH (ref 12.0–15.0)
MCH: 29.3 pg (ref 26.0–34.0)
MCHC: 31.9 g/dL (ref 30.0–36.0)
MCV: 91.8 fL (ref 80.0–100.0)
Platelets: 259 10*3/uL (ref 150–400)
RBC: 5.15 MIL/uL — ABNORMAL HIGH (ref 3.87–5.11)
RDW: 14.2 % (ref 11.5–15.5)
WBC: 7 10*3/uL (ref 4.0–10.5)
nRBC: 0 % (ref 0.0–0.2)

## 2024-01-16 LAB — TROPONIN I (HIGH SENSITIVITY)
Troponin I (High Sensitivity): 4 ng/L (ref <18)
Troponin I (High Sensitivity): 3 ng/L (ref <18)

## 2024-01-16 LAB — CREATININE, SERUM
Creatinine, Ser: 0.38 mg/dL — ABNORMAL LOW (ref 0.44–1.00)
GFR, Estimated: >60 mL/min (ref >60)

## 2024-01-16 LAB — GLUCOSE, CAPILLARY: Glucose-Capillary: 184 mg/dL — ABNORMAL HIGH (ref 70–99)

## 2024-01-16 LAB — HEMOGLOBIN A1C
Hgb A1c MFr Bld: 6.1 % — ABNORMAL HIGH (ref 4.8–5.6)
Mean Plasma Glucose: 128.37 mg/dL

## 2024-01-16 LAB — CBG MONITORING, ED
Glucose-Capillary: 137 mg/dL — ABNORMAL HIGH (ref 70–99)
Glucose-Capillary: 285 mg/dL — ABNORMAL HIGH (ref 70–99)

## 2024-01-16 MED ORDER — ACETAMINOPHEN 325 MG PO TABS: 650.0000 mg | ORAL_TABLET | ORAL | Status: DC | PRN

## 2024-01-16 MED ORDER — SIMVASTATIN 20 MG PO TABS
20.0000 mg | ORAL_TABLET | Freq: Every evening | ORAL | Status: DC
  Administered 2024-01-16 – 2024-01-17 (×2): 20 mg via ORAL
  Filled 2024-01-16 (×2): qty 1

## 2024-01-16 MED ORDER — ONDANSETRON HCL 4 MG PO TABS: 4.0000 mg | ORAL_TABLET | Freq: Four times a day (QID) | ORAL | Status: DC | PRN

## 2024-01-16 MED ORDER — ACETAMINOPHEN 650 MG RE SUPP: 650.0000 mg | RECTAL | Status: DC | PRN

## 2024-01-16 MED ORDER — LEVETIRACETAM (KEPPRA) 500 MG/5 ML ADULT IV PUSH
1500.0000 mg | Freq: Two times a day (BID) | INTRAVENOUS | Status: DC
  Administered 2024-01-16 – 2024-01-17 (×3): 1500 mg via INTRAVENOUS
  Filled 2024-01-16 (×4): qty 15

## 2024-01-16 MED ORDER — ONDANSETRON HCL 4 MG/2ML IJ SOLN: 4.0000 mg | Freq: Four times a day (QID) | INTRAMUSCULAR | Status: DC | PRN

## 2024-01-16 MED ORDER — POLYVINYL ALCOHOL 1.4 % OP SOLN: 1.0000 [drp] | Freq: Three times a day (TID) | OPHTHALMIC | Status: DC | PRN

## 2024-01-16 MED ORDER — HEPARIN SODIUM (PORCINE) 5000 UNIT/ML IJ SOLN
5000.0000 [IU] | Freq: Three times a day (TID) | INTRAMUSCULAR | Status: DC
  Administered 2024-01-16 – 2024-01-17 (×4): 5000 [IU] via SUBCUTANEOUS
  Filled 2024-01-16 (×4): qty 1

## 2024-01-16 MED ORDER — DOCUSATE SODIUM 100 MG PO CAPS
100.0000 mg | ORAL_CAPSULE | Freq: Two times a day (BID) | ORAL | Status: DC
  Administered 2024-01-16 – 2024-01-17 (×3): 100 mg via ORAL
  Filled 2024-01-16 (×3): qty 1

## 2024-01-16 MED ORDER — VITAMIN D 25 MCG (1000 UNIT) PO TABS
2000.0000 [IU] | ORAL_TABLET | Freq: Every day | ORAL | Status: DC
  Administered 2024-01-16 – 2024-01-17 (×2): 2000 [IU] via ORAL
  Filled 2024-01-16 (×2): qty 2

## 2024-01-16 MED ORDER — GABAPENTIN 300 MG PO CAPS
300.0000 mg | ORAL_CAPSULE | Freq: Three times a day (TID) | ORAL | Status: DC
  Administered 2024-01-16 – 2024-01-17 (×5): 300 mg via ORAL
  Filled 2024-01-16 (×5): qty 1

## 2024-01-16 MED ORDER — ORAL CARE MOUTH RINSE
15.0000 mL | OROMUCOSAL | Status: DC | PRN
Start: 2024-01-16 — End: 2024-01-17

## 2024-01-16 MED ORDER — ADULT MULTIVITAMIN W/MINERALS CH
1.0000 | ORAL_TABLET | Freq: Every day | ORAL | Status: DC
  Administered 2024-01-16 – 2024-01-17 (×2): 1 via ORAL
  Filled 2024-01-16 (×2): qty 1

## 2024-01-16 MED ORDER — POLYETHYL GLYCOL-PROPYL GLYCOL 0.4-0.3 % OP SOLN: 1.0000 [drp] | Freq: Three times a day (TID) | OPHTHALMIC | Status: DC | PRN

## 2024-01-16 MED ORDER — GADOBUTROL 1 MMOL/ML IV SOLN
10.0000 mL | Freq: Once | INTRAVENOUS | Status: AC | PRN
  Administered 2024-01-16: 10 mL via INTRAVENOUS

## 2024-01-16 MED ORDER — SODIUM CHLORIDE 0.9 % IV SOLN
75.0000 mL/h | INTRAVENOUS | Status: DC
Start: 2024-01-16 — End: 2024-01-17
  Administered 2024-01-16 (×3): 75 mL/h via INTRAVENOUS

## 2024-01-16 MED ORDER — INSULIN GLARGINE-YFGN 100 UNIT/ML ~~LOC~~ SOLN
25.0000 [IU] | Freq: Every day | SUBCUTANEOUS | Status: DC
  Administered 2024-01-16: 25 [IU] via SUBCUTANEOUS
  Filled 2024-01-16 (×2): qty 0.25

## 2024-01-16 MED ORDER — LORAZEPAM 2 MG/ML IJ SOLN: 4.0000 mg | INTRAMUSCULAR | Status: DC | PRN

## 2024-01-16 MED ORDER — ASPIRIN 81 MG PO CHEW
81.0000 mg | CHEWABLE_TABLET | Freq: Every day | ORAL | Status: DC
  Administered 2024-01-16 – 2024-01-17 (×2): 81 mg via ORAL
  Filled 2024-01-16 (×2): qty 1

## 2024-01-16 MED ORDER — PANTOPRAZOLE SODIUM 40 MG PO TBEC
40.0000 mg | DELAYED_RELEASE_TABLET | Freq: Every day | ORAL | Status: DC
  Administered 2024-01-16 – 2024-01-17 (×2): 40 mg via ORAL
  Filled 2024-01-16 (×3): qty 1

## 2024-01-16 MED ORDER — FLUOXETINE HCL 20 MG PO CAPS
20.0000 mg | ORAL_CAPSULE | Freq: Every morning | ORAL | Status: DC
  Administered 2024-01-16 – 2024-01-17 (×2): 20 mg via ORAL
  Filled 2024-01-16 (×2): qty 1

## 2024-01-16 MED ORDER — FOLIC ACID 1 MG PO TABS
1.0000 mg | ORAL_TABLET | Freq: Every day | ORAL | Status: DC
  Administered 2024-01-16 – 2024-01-17 (×2): 1 mg via ORAL
  Filled 2024-01-16 (×2): qty 1

## 2024-01-16 MED ORDER — INSULIN ASPART 100 UNIT/ML IJ SOLN
0.0000 [IU] | Freq: Three times a day (TID) | INTRAMUSCULAR | Status: DC
  Administered 2024-01-16: 2 [IU] via SUBCUTANEOUS
  Administered 2024-01-16: 8 [IU] via SUBCUTANEOUS
  Filled 2024-01-16: qty 0.15

## 2024-01-16 MED ORDER — SODIUM CHLORIDE 0.9 % IV BOLUS: 500.0000 mL | Freq: Once | INTRAVENOUS | Status: DC

## 2024-01-16 MED ORDER — IBUPROFEN 200 MG PO TABS: 600.0000 mg | ORAL_TABLET | Freq: Four times a day (QID) | ORAL | Status: DC | PRN

## 2024-01-16 MED ORDER — ORAL CARE MOUTH RINSE
15.0000 mL | OROMUCOSAL | Status: DC
  Administered 2024-01-16 – 2024-01-17 (×12): 15 mL via OROMUCOSAL

## 2024-01-16 MED ORDER — SODIUM CHLORIDE 0.9 % IV BOLUS
1000.0000 mL | Freq: Once | INTRAVENOUS | Status: AC
  Administered 2024-01-16: 1000 mL via INTRAVENOUS

## 2024-01-16 NOTE — ED Notes (Signed)
 [facility] at bedside

## 2024-01-16 NOTE — Progress Notes (Signed)
 Pt has ingrown toe nail infected on her right great toe on skin assessment per documented below. No drainage. We will hand off to the day shift team.     01/16/24 2030  Wound 01/16/24 1900 Other (Comment) Toe (Comment  which one) Anterior;Right;Lateral  Date First Assessed/Time First Assessed: 01/16/24 1900   Present on Original Admission: Yes  Primary Wound Type: (c) Other (Comment)  Location: (c) Toe (Comment  which one)  Location Orientation: Anterior;Right;Lateral  Wound Image   Site / Wound Assessment Red;Pink, Black  Peri-wound Assessment Pink;Edema;Erythema (blanchable)  Wound Length (cm) 2 cm  Wound Width (cm) 2 cm  Wound Surface Area (cm^2) 3.14 cm^2  Wound Depth (cm) 0 cm  Wound Volume (cm^3) 0 cm^3  Drainage Amount None  Dressing Type None   Wendi Dash, RN

## 2024-01-16 NOTE — ED Notes (Signed)
[facility] called for transport 

## 2024-01-16 NOTE — ED Notes (Signed)
Pt tx to MRI

## 2024-01-16 NOTE — ED Notes (Signed)
 Pt SpO2 repeatedly dropping into 80s while she is sleeping and snoring. Pt placed on [place] at 3 lpm to supplement.

## 2024-01-16 NOTE — ED Notes (Signed)
 Patient transported to CT

## 2024-01-16 NOTE — Progress Notes (Signed)
Patient arrived on unit.

## 2024-01-16 NOTE — Progress Notes (Signed)
 PROGRESS NOTE    Brandy Mckinney  FMW:[id number] DOB: 12/12/1965 DOA: 01/15/2024 PCP: Chrystal Lamarr RAMAN, MD    Chief Complaint  Patient presents with   Seizures    Brief Narrative:  Patient 58 year old female with a history of dementia, hyperlipidemia, hypertension, chronic lower back pain, multiple sclerosis, chronic debility with bedbound state as well as seizure disorder presented to the ED with a seizure episode.  Patient noted to be postictal initially in the ED and subsequently improved.  Patient was about to be discharged home with outpatient follow-up when she had another seizure in the ED.  On-call neurology consulted recommended MRI, EEG, Keppra  load and increasing maintenance Keppra  to 1500 mg p.o. twice daily.   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  Etiology. - Patient noted to have had last seizure approximately a year ago. - Per admitting physician patient noted to be compliant with medications. - Patient status post seizure episode x 2. - Status post Keppra  load in the ED. -Keppra  dose increased to 1500 mg twice daily per neurorecommendations. - MRI brain with no acute intracranial abnormality, findings consistent with severe demyelinating disease, including advanced atrophy more significant in the frontal lobes bilaterally, diffuse periventricular white matter disease with significant volume loss and ex vacuo dilatation of the lateral ventricles stable compared to prior studies - EEG pending - Continue neurochecks. -Patient confused this morning on evaluation. -Check a UA with cultures and sensitivities. -Chest  x-ray negative for any acute infiltrate. - Awaiting transfer to [facility] for formal consultation by neurology and further evaluation.  2.  MS -Chronic debility and bedbound state. -- MRI brain with no acute intracranial abnormality, findings consistent with severe demyelinating disease, including advanced atrophy more significant in the frontal lobes bilaterally, diffuse periventricular white matter disease with significant volume loss and ex vacuo dilatation of the lateral ventricles stable compared to prior studies.  3.  Diabetes mellitus type 2 -Hemoglobin A1c 6.1 - CBG 137 this morning. - Patient placed on clear liquids this morning will advance to a carb modified diet. - Resume and decreased dose of patient's long-acting insulin  at 25 units daily and placed on SSI. - Follow.  4.  Mild dementia -Currently not on any medications.  5.  Hyperlipidemia -Resume home statin.  6.  Hypertension - BP soft/borderline - Continue to hold antihypertensive medications.  7.  Chronic low back pain -Pain management.    DVT prophylaxis: Heparin  Code Status: Full Family Communication: No family at bedside. Disposition: TBD  Status is: Inpatient Remains inpatient appropriate because: Severity of illness   Consultants:  Neurology  Procedures:  CT head 01/16/2024 MRI brain 01/16/2024   Antimicrobials:  Anti-infectives (From admission, onward)    None         Subjective: Patient lying in bed getting cleaned up.  Patient alert to self and place only.  Keeps repeating she is at [employer].  Denies any chest pain or shortness of breath.  Denies any abdominal pain.  Denies any dysuria.  Patient confused.  Objective: Vitals:   01/16/24 1400 01/16/24 1500 01/16/24 1556 01/16/24 1600  BP: 108/78 106/83  98/71  Pulse: (!) 116 (!) 110  (!)  110  Resp: 14 16  12   Temp:   98.5 F (36.9 C)   TempSrc:   Axillary   SpO2: 100% 91%  93%  Weight:      Height:       No intake or  output data in the 24 hours ending 01/16/24 1641 Filed Weights   01/15/24 1830  Weight: 88.5 kg    Examination:  General exam: Appears calm and comfortable  Respiratory system: Clear to auscultation.  No wheezes, no crackles, no rhonchi.  Fair air movement.  Speaking in full sentences.  Respiratory effort normal. Cardiovascular system: S1 & S2 heard, RRR. No JVD, murmurs, rubs, gallops or clicks. No pedal edema. Gastrointestinal system: Abdomen is nondistended, obese, soft and nontender. No organomegaly or masses felt. Normal bowel sounds heard. Central nervous system: Alert and oriented to self and place only.  Moving extremities spontaneously.. No focal neurological deficits. Extremities: Symmetric 5 x 5 power. Skin: No rashes, lesions or ulcers Psychiatry: Judgement and insight appear poor to fair. Mood & affect appropriate.     Data Reviewed: I have personally reviewed following labs and imaging studies  CBC: Recent Labs  Lab 01/15/24 1842 01/16/24 0833  WBC 8.5 7.0  HGB 14.7 15.1*  HCT 45.9 47.3*  MCV 93.1 91.8  PLT 223 259    Basic Metabolic Panel: Recent Labs  Lab 01/15/24 1926 01/16/24 0833  NA 137  --   K 4.1  --   CL 101  --   CO2 25  --   GLUCOSE 118*  --   BUN 15  --   CREATININE 0.45 0.38*  CALCIUM 8.9  --   MG 2.1  --     GFR: Estimated Creatinine Clearance: 82.2 mL/min (A) (by C-G formula based on SCr of 0.38 mg/dL (L)).  Liver Function Tests: No results for input(s): AST, ALT, ALKPHOS, BILITOT, PROT, ALBUMIN in the last 168 hours.  CBG: Recent Labs  Lab 01/16/24 1150  GLUCAP 137*     No results found for this or any previous visit (from the past 240 hours).       Radiology Studies: MR BRAIN W WO CONTRAST Result Date: 01/16/2024 EXAM: MRI BRAIN WITH AND WITHOUT CONTRAST 01/16/2024 08:20:40 AM TECHNIQUE: Multiplanar multisequence MRI of the head/brain was performed with and without the administration of intravenous  contrast. COMPARISON: MR head without contrast 10/27/2023. CT head without contrast 01/16/2024. CLINICAL HISTORY: Seizure disorder, clinical change. FINDINGS: BRAIN AND VENTRICLES: Advanced atrophy is again noted, most significant in the frontal lobes bilaterally. Diffuse periventricular white matter disease is present with significant volume loss, stable. Ex vacuo dilation of the lateral ventricles is present. Findings are most consistent with severe demyelinating disease. No acute infarct. No acute intracranial hemorrhage. No mass effect or midline shift. No hydrocephalus. The sella is unremarkable. Normal flow voids. No mass or abnormal enhancement. ORBITS: No acute abnormality. SINUSES: Minimal fluid is present in the left sphenoid sinus. No acute abnormality. BONES AND SOFT TISSUES: Normal bone marrow signal and enhancement. No acute soft tissue abnormality. IMPRESSION: 1. No acute intracranial abnormality. 2. Findings consistent with severe demyelinating disease, including advanced atrophy (most significant in the frontal lobes bilaterally), diffuse periventricular white matter disease with significant volume loss, and ex vacuo dilation of the lateral ventricles. Stable compared to prior studies. Electronically signed by: Lonni Necessary MD 01/16/2024 08:29 AM EDT RP Workstation: [id number]   CT HEAD WO CONTRAST ( ) Result Date: 01/16/2024 CLINICAL DATA:  Altered mental status EXAM: CT  HEAD WITHOUT CONTRAST TECHNIQUE: Contiguous axial images were obtained from the base of the skull through the vertex without intravenous contrast. RADIATION DOSE REDUCTION: This exam was performed according to the departmental dose-optimization program which includes automated exposure control, adjustment of the mA and/or kV according to patient size and/or use of iterative reconstruction technique. COMPARISON:  12/29/2022 FINDINGS: Brain: There is no mass, hemorrhage or extra-axial collection. There is generalized  atrophy without lobar predilection. Hypodensity of the white matter is most commonly associated with chronic microvascular disease. Vascular: No hyperdense vessel or unexpected vascular calcification. Skull: The visualized skull base, calvarium and extracranial soft tissues are normal. Sinuses/Orbits: No fluid levels or advanced mucosal thickening of the visualized paranasal sinuses. No mastoid or middle ear effusion. Normal orbits. Other: None. IMPRESSION: 1. No acute intracranial abnormality. 2. Generalized atrophy and findings of chronic microvascular disease. Electronically Signed   By: Franky Stanford M.D.   On: 01/16/2024 02:25   DG Chest Portable 1 View Result Date: 01/15/2024 CLINICAL DATA:  Cough and seizure EXAM: PORTABLE CHEST 1 VIEW COMPARISON:  Chest x-ray 12/30/2022 FINDINGS: The heart size and mediastinal contours are within normal limits. Both lungs are clear. The visualized skeletal structures are unremarkable. IMPRESSION: No active disease. Electronically Signed   By: Greig Pique M.D.   On: 01/15/2024 19:49        Scheduled Meds:  aspirin   81 mg Oral Daily   cholecalciferol  2,000 Units Oral Daily   docusate sodium  100 mg Oral BID   FLUoxetine   20 mg Oral q AM   folic acid  1 mg Oral Daily   gabapentin   300 mg Oral TID   heparin   5,000 Units Subcutaneous Q8H   insulin  aspart  0-15 Units Subcutaneous TID WC   insulin  glargine-yfgn  25 Units Subcutaneous QHS   levETIRAcetam   1,500 mg Intravenous Q12H   multivitamin with minerals  1 tablet Oral Daily   mouth rinse  15 mL Mouth Rinse Q2H   pantoprazole   40 mg Oral Daily   simvastatin   20 mg Oral QPM   Continuous Infusions:  sodium chloride  100 mL/hr (01/16/24 0950)     LOS: 1 day    Time spent: 40 minutes    Toribio Hummer, MD [facility]   To contact the attending provider between 7A-7P or the covering provider during after hours 7P-7A, please log into the web site [email or web address] and access using  universal [place] password for that web site. If you do not have the password, please call the hospital operator.  01/16/2024, 4:41 PM

## 2024-01-16 NOTE — Progress Notes (Signed)
 Pt transferred to [facility] 3W27. Pt is alert and oriented x 2 to herself and her brother and situations. Per Pt's brother stated her cognitive level is equal as her baseline dementia. Pt is able to follow commands, cooperative, stable hemodynamically, afebrile, sinus tachycardia on the monitor, HR 110-117, on 2 LPM of O2 NCL, SPO2 95-100%, Her MEWS score at arrival is in YELLOW. No neurological changes from her baseline. No seizure activities or obvious acute distress at arrival. Plan of care is reviewed and discussed with Pt and her brother at bedside. We will continue to monitor.     01/16/24 2030  Neurological  Neuro (WDL) X  Orientation Level Oriented to person;Oriented to situation;Disoriented to time;Disoriented to place  Cognition Follows commands;Memory impairment  Speech Slurred/Dysarthria  R Pupil Size (mm) 3  R Pupil Shape Round  R Pupil Reaction Brisk  L Pupil Size (mm) 3  L Pupil Shape Round  L Pupil Reaction Brisk  Motor Function/Sensation Assessment Grip;Elbow extension;Elbow flexion;Motor strength;Motor response;Sensation;Leg ataxia;Arm ataxia;Plantar flexion;Pronator drift;Dorsiflexion;Head  Facial Symmetry Asymmetrical left  Facial Droop Left  R Hand Grip Strong  L Hand Grip Weak   R Elbow Extension (Push/Biceps) Moderate  L Elbow Extension (Push/Biceps) Weak  R Elbow Flexion (Pull/Triceps) Moderate  L Elbow Flexion (Pull/Triceps) Weak  Right Pronator Drift Absent  Left Pronator Drift Present  R Foot Dorsiflexion Weak  L Foot Dorsiflexion Weak  R Foot Plantar Flexion Weak  L Foot Plantar Flexion Weak  R Finger to Nose (Point to [employer]) Smooth  L Finger to Nose (Point to [employer]) Ataxia  R Heel to [employer] (Point to [employer]) Ataxia  L Heel to [employer] (Point to [employer]) Ataxia  RUE Motor Response Purposeful movement  RUE Sensation Full sensation  RUE Motor Strength 5  LUE Motor Response Purposeful movement  LUE Sensation Full sensation  LUE Motor Strength 4  RLE Motor Response  Purposeful movement  RLE Sensation Full sensation  RLE Motor Strength 2  LLE Motor Response Purposeful movement  LLE Sensation Full sensation  LLE Motor Strength 2  Neuro Symptoms Fatigue;Forgetful  Neuro Additional Assessments Glasgow Coma Scale  Seizure Activity  Psychomotor Symptoms None  Interventions Airway support;Oxygen;Seizure Pads;Suction;Medication;Head Protection  Glasgow Coma Scale  Eye Opening 4  Best Verbal Response (NON-intubated) 4  Best Motor Response 6  Glasgow Coma Scale Score 14    Wendi Dash, RN

## 2024-01-16 NOTE — Consult Note (Signed)
 NEUROLOGY CONSULT NOTE   Date of service: January 16, 2024 Patient Name: Brandy Mckinney MRN:  [id number] DOB:  Oct 19, 1965 Chief Complaint: Breakthrough seizure Requesting Provider: Sebastian Toribio GAILS, MD  AI software was used for assistance in this note with consent of the patient/brother at bedside  History of Present Illness  Brandy Mckinney is a 58 y.o. woman with history of MS on DMT and seizures on keppra   She has a history of seizures and is currently on Keppra  and gabapentin . She has not missed any doses. Gabapentin  is taken primarily for pain, two to three times a day as needed. Recently, she experienced a prolonged seizure lasting two to three minutes, which is longer than her usual seizures that last 15 to 30 seconds. This occurred after she exhibited warning signs such as confusion, counting, and asking about deceased family members. These warning signs have been noticed occasionally, approximately once every couple of months, previously attributed to dementia related confusion but now brother wonders if this might be her seizure prodrome.  She presented to [facility] ED and was about to be discharged when she had a second witnessed seizure episode.  I was consulted by phone and recommended increase of Keppra  and MRI brain to rule out acute process.  She was subsequently transferred to [facility] for full evaluation  She had a cold lasting about two weeks, which has resolved except for an occasional cough. A chest x-ray and labs were performed, showing no significant issues, and there was no urinary tract infection.  She has multiple sclerosis, which has resulted in lesions on her brain, increasing her risk for seizures. She has been bedridden for the last eleven years and at baseline is unable to recall her age or the current year, but she can remember personal details such as her children's names and her birthday. She is conversant with a pleasant and spirited  personality. She is dependent for all ADLs   Her medication regimen includes long-term insulin  for diabetes, as she was taken off short-term insulin  about two months ago. She is also on medication for high blood pressure, which was adjusted to a higher dose recently.    ROS  Unable to assess secondary to patient's mental status / baseline dementia, obtained from brother  Past History   Past Medical History:  Diagnosis Date   Early onset Alzheimer's dementia (HCC)    Hypercholesteremia    Hyperlipidemia    Hypertension    Lumbosacral pain    Multiple sclerosis (HCC)    Seizure (HCC)    Vitamin D deficiency     Past Surgical History:  Procedure Laterality Date   ABDOMINAL HYSTERECTOMY     CESAREAN SECTION     OOPHORECTOMY      Family History: History reviewed. No pertinent family history.  Social History  reports that she quit smoking about 13 years ago. Her smoking use included cigarettes. She has never used smokeless tobacco. She reports that she does not currently use alcohol. She reports that she does not use drugs.  Allergies  Allergen Reactions   Fentanyl  Other (See Comments)    Unk rxn    Medications   Current Facility-Administered Medications:    0.9 %  sodium chloride  infusion, 75 mL/hr, Intravenous, Continuous, Debby Hitch A, MD, Last Rate: 75 mL/hr at 01/16/24 2019, 75 mL/hr at 01/16/24 2019   acetaminophen  (TYLENOL ) tablet 650 mg, 650 mg, Oral, Q4H PRN **OR** acetaminophen  (TYLENOL ) suppository 650 mg, 650 mg, Rectal, Q4H PRN, Debby,  Camila LABOR, MD   artificial tears ophthalmic solution 1-2 drop, 1-2 drop, Both Eyes, TID PRN, Girguis, David, MD   aspirin  chewable tablet 81 mg, 81 mg, Oral, Daily, Sebastian Toribio GAILS, MD, 81 mg at 01/16/24 9044   cholecalciferol (VITAMIN D3) 25 MCG (1000 UNIT) tablet 2,000 Units, 2,000 Units, Oral, Daily, Sebastian Toribio GAILS, MD, 2,000 Units at 01/16/24 0954   docusate sodium (COLACE) capsule 100 mg, 100 mg, Oral, BID,  Debby Camila A, MD, 100 mg at 01/16/24 2021   FLUoxetine  (PROZAC ) capsule 20 mg, 20 mg, Oral, q AM, Sebastian Toribio GAILS, MD, 20 mg at 01/16/24 9045   folic acid (FOLVITE) tablet 1 mg, 1 mg, Oral, Daily, Debby Camila A, MD, 1 mg at 01/16/24 9070   gabapentin  (NEURONTIN ) capsule 300 mg, 300 mg, Oral, TID, Sebastian Toribio GAILS, MD, 300 mg at 01/16/24 2021   heparin  injection 5,000 Units, 5,000 Units, Subcutaneous, Q8H, Debby Camila LABOR, MD, 5,000 Units at 01/16/24 2021   ibuprofen (ADVIL) tablet 600 mg, 600 mg, Oral, Q6H PRN, Sebastian Toribio GAILS, MD   insulin  aspart (novoLOG ) injection 0-15 Units, 0-15 Units, Subcutaneous, TID WC, Sebastian Toribio GAILS, MD, 8 Units at 01/16/24 1705   insulin  glargine-yfgn (SEMGLEE ) injection 25 Units, 25 Units, Subcutaneous, QHS, Sebastian Toribio GAILS, MD   levETIRAcetam  (KEPPRA ) undiluted injection 1,500 mg, 1,500 mg, Intravenous, Q12H, Debby Camila A, MD, 1,500 mg at 01/16/24 2020   LORazepam (ATIVAN) injection 4 mg, 4 mg, Intravenous, Q5 Min x 2 PRN, Debby Camila LABOR, MD   multivitamin with minerals tablet 1 tablet, 1 tablet, Oral, Daily, Debby Camila A, MD, 1 tablet at 01/16/24 0929   ondansetron (ZOFRAN) tablet 4 mg, 4 mg, Oral, Q6H PRN **OR** ondansetron (ZOFRAN) injection 4 mg, 4 mg, Intravenous, Q6H PRN, Debby Camila LABOR, MD   Oral care mouth rinse, 15 mL, Mouth Rinse, Q2H, Debby Camila A, MD, 15 mL at 01/16/24 2022   Oral care mouth rinse, 15 mL, Mouth Rinse, PRN, Debby Camila LABOR, MD   pantoprazole  (PROTONIX ) EC tablet 40 mg, 40 mg, Oral, Daily, Debby Camila A, MD, 40 mg at 01/16/24 9070   simvastatin  (ZOCOR ) tablet 20 mg, 20 mg, Oral, QPM, Sebastian Toribio GAILS, MD, 20 mg at 01/16/24 2021  Vitals   Vitals:   01/16/24 1700 01/16/24 1730 01/16/24 1800 01/16/24 2006  BP: 101/74 104/73 (!) 114/93 (!) 127/113  Pulse: (!) 111 (!) 113 (!) 117 (!) 114  Resp: 10 13 15 14   Temp:    98.5 F (36.9 C)  TempSrc:    Axillary  SpO2:  92% 95% 96% 100%  Weight:      Height:        Body mass index is 35.67 kg/m.   Physical Exam   Constitutional: Appears comfortable, in no acute distress, well-nourished Psych: Affect appropriate to situation, pleasant, cooperative, jovial Eyes: No scleral injection, mild discharge HENT: No oropharyngeal obstruction.  MSK: left hand contracture  Cardiovascular: Normal rate and regular rhythm.  Perfusing extremities well Respiratory: Effort normal, non-labored breathing GI: Soft.  No distension. There is no tenderness.  Skin: Warm dry and intact visible skin  Neurologic Examination   Mental Status: Patient is awake, alert, conversant, follows simple commands, oriented to self and some details of her history but there may be some confabulation at play as well Cranial Nerves: II: Visual Fields are full.  Left APD III,IV, VI: Saccadic eye movements with occasionally slightly disconjugate gaze V: Facial sensation is symmetric to light touch  VII: Facial movement is mild left facial droop.  VIII: hearing is intact to voice Motor: Essentially 5/5 in the right upper extremity.  Left upper extremity slightly weak in elbow extension 4/5, otherwise 5/5.  Hand strength not tested given chronic contractures of the hand.  Bilateral lower extremities 2/5 Sensory: Reports intact throughout however detailed sensory exam deferred as it would not change management and brother does note she has some sensory deficits from her MS Deep Tendon Reflexes: 3+ and symmetric brachioradialis   Labs/Imaging/Neurodiagnostic studies   CBC:  Recent Labs  Lab 05-Feb-2024 1842 01/16/24 0833  WBC 8.5 7.0  HGB 14.7 15.1*  HCT 45.9 47.3*  MCV 93.1 91.8  PLT 223 259   Basic Metabolic Panel:  Lab Results  Component Value Date   NA 137 02/05/24   K 4.1 02/05/2024   CO2 25 02-05-2024   GLUCOSE 118 (H) 02-05-2024   BUN 15 02/05/2024   CREATININE 0.38 (L) 01/16/2024   CALCIUM 8.9 February 05, 2024   GFRNONAA  >60 01/16/2024   GFRAA 130 04/15/2020   Lipid Panel: No results found for: LDLCALC HgbA1c:  Lab Results  Component Value Date   HGBA1C 6.1 (H) 01/16/2024   Urine Drug Screen: No results found for: LABOPIA, COCAINSCRNUR, LABBENZ, AMPHETMU, THCU, LABBARB  Alcohol Level No results found for: [facility] INR  Lab Results  Component Value Date   INR 1.0 12/29/2022   APTT  Lab Results  Component Value Date   APTT 29 11/22/2021   AED levels: No results found for: PHENYTOIN, ZONISAMIDE, LAMOTRIGINE, LEVETIRACETA  CT Head without contrast(Personally reviewed): No acute intracranial process  MRI Brain with and without contrast (Personally reviewed): 1. No acute intracranial abnormality. 2. Findings consistent with severe demyelinating disease, including advanced atrophy (most significant in the frontal lobes bilaterally), diffuse periventricular white matter disease with significant volume loss, and ex vacuo dilation of the lateral ventricles. Stable compared to prior studies.  ASSESSMENT   Brandy Mckinney is a 58 y.o. 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  Therefore merits increase in antiseizure medications.  Tolerating Keppra  well at home and tolerating dose increase here without any side effects  RECOMMENDATIONS  -Continue Keppra  1500 mg twice daily increased from prior home dose of 1000 mg twice daily on my recommendations yesterday -As she is now on max dose Keppra , if she has further seizures in the future may consider making gabapentin  a standing medication and increasing the dose, or adding a different agent -MRI brain completed on my recommendations, negative as above -No further inpatient neurological workup -Seizure precautions reviewed, she has 24/7 care and does not drive; discharge instructions updated -Continue outpatient  follow-up with Dr. Vear ______________________________________________________________________  Lola Jernigan MD-PhD [facility] [phone] Available 7 PM to 7 AM, outside of these hours please call Neurologist on call as listed on Amion.

## 2024-01-17 ENCOUNTER — Other Ambulatory Visit (HOSPITAL_COMMUNITY): Payer: Self-pay

## 2024-01-17 DIAGNOSIS — G40909 Epilepsy, unspecified, not intractable, without status epilepticus: Secondary | ICD-10-CM | POA: Diagnosis not present

## 2024-01-17 DIAGNOSIS — G35 Multiple sclerosis: Secondary | ICD-10-CM | POA: Diagnosis not present

## 2024-01-17 DIAGNOSIS — F0280 Dementia in other diseases classified elsewhere without behavioral disturbance: Secondary | ICD-10-CM | POA: Diagnosis not present

## 2024-01-17 LAB — RENAL FUNCTION PANEL
Albumin: 2.9 g/dL — ABNORMAL LOW (ref 3.5–5.0)
Anion gap: 4 — ABNORMAL LOW (ref 5–15)
BUN: 6 mg/dL (ref 6–20)
CO2: 27 mmol/L (ref 22–32)
Calcium: 8.1 mg/dL — ABNORMAL LOW (ref 8.9–10.3)
Chloride: 106 mmol/L (ref 98–111)
Creatinine, Ser: 0.4 mg/dL — ABNORMAL LOW (ref 0.44–1.00)
GFR, Estimated: >60 mL/min (ref >60)
Glucose, Bld: 146 mg/dL — ABNORMAL HIGH (ref 70–99)
Phosphorus: 2.8 mg/dL (ref 2.5–4.6)
Potassium: 3.9 mmol/L (ref 3.5–5.1)
Sodium: 137 mmol/L (ref 135–145)

## 2024-01-17 LAB — URINE CULTURE: Culture: NO GROWTH

## 2024-01-17 LAB — GLUCOSE, CAPILLARY
Glucose-Capillary: 119 mg/dL — ABNORMAL HIGH (ref 70–99)
Glucose-Capillary: 158 mg/dL — ABNORMAL HIGH (ref 70–99)
Glucose-Capillary: 191 mg/dL — ABNORMAL HIGH (ref 70–99)

## 2024-01-17 LAB — MAGNESIUM: Magnesium: 2.1 mg/dL (ref 1.7–2.4)

## 2024-01-17 LAB — CBC
HCT: 43.5 % (ref 36.0–46.0)
Hemoglobin: 13.8 g/dL (ref 12.0–15.0)
MCH: 29.6 pg (ref 26.0–34.0)
MCHC: 31.7 g/dL (ref 30.0–36.0)
MCV: 93.3 fL (ref 80.0–100.0)
Platelets: 190 10*3/uL (ref 150–400)
RBC: 4.66 MIL/uL (ref 3.87–5.11)
RDW: 14 % (ref 11.5–15.5)
WBC: 7 10*3/uL (ref 4.0–10.5)
nRBC: 0 % (ref 0.0–0.2)

## 2024-01-17 LAB — LEVETIRACETAM LEVEL

## 2024-01-17 MED ORDER — LEVETIRACETAM 750 MG PO TABS: 1500.0000 mg | ORAL_TABLET | Freq: Two times a day (BID) | ORAL | 2 refills | Status: AC

## 2024-01-17 MED ORDER — ORAL CARE MOUTH RINSE: 15.0000 mL | OROMUCOSAL | Status: DC | PRN

## 2024-01-17 MED ORDER — LEVETIRACETAM 750 MG PO TABS
1500.0000 mg | ORAL_TABLET | Freq: Two times a day (BID) | ORAL | 0 refills | Status: AC
  Filled 2024-01-17 (×2): qty 120, 30d supply, fill #0

## 2024-01-17 MED ORDER — INSULIN ASPART 100 UNIT/ML IJ SOLN
0.0000 [IU] | Freq: Three times a day (TID) | INTRAMUSCULAR | Status: DC
  Administered 2024-01-17 (×2): 1 [IU] via SUBCUTANEOUS

## 2024-01-17 NOTE — TOC Transition Note (Signed)
 Transition of Care [facility]) - Discharge Note   Patient Details  Name: Brandy Mckinney MRN: [id number] Date of Birth: 1966/01/13  Transition of Care [facility] CM/SW Contact:  Andrez JULIANNA George, RN Phone Number: 01/17/2024, 1:18 PM   Clinical Narrative:     Pt is discharging home with self care. She will transport home via [facility] per brother request. Address verified and [facility] arranged.    Final next level of care: Home/Self Care Barriers to Discharge: No Barriers Identified   Patient Goals and CMS Choice            Discharge Placement                       Discharge Plan and Services Additional resources added to the After Visit Summary for     Discharge Planning Services: CM Consult                                 Social Drivers of Health (SDOH) Interventions SDOH Screenings   Food Insecurity: No Food Insecurity (01/16/2024)  Housing: Low Risk  (01/16/2024)  Transportation Needs: No Transportation Needs (01/16/2024)  Utilities: Not At Risk (01/16/2024)  Tobacco Use: Medium Risk (01/15/2024)     Readmission Risk Interventions     No data to display

## 2024-01-17 NOTE — Discharge Summary (Signed)
 Physician Discharge Summary   Erva Koke FMW:[id number] DOB: [birth year] DOA: 01/15/2024  PCP: Chrystal Lamarr RAMAN, MD  Admit date: 01/15/2024 Discharge date: 01/17/2024  Admitted From: Home Disposition:  Home Discharging physician: Alm Apo, MD Barriers to discharge: none  Recommendations at discharge: Continue chronic medical management  Discharge Condition: stable CODE STATUS: Full  Diet recommendation:  Diet Orders (From admission, onward)     Start     Ordered   01/17/24 0000  Diet Carb Modified        01/17/24 1222   01/16/24 0906  Diet Carb Modified Fluid consistency: Thin; Room service appropriate? Yes  Diet effective now       Question Answer Comment  Diet-HS Snack? Nothing   Calorie Level Medium 1600-2000   Fluid consistency: Thin   Room service appropriate? Yes      01/16/24 0905            Hospital Course: Patient 58 year old female with a history of MS (bedbound at baseline), seizure disorder, dementia, hyperlipidemia, hypertension, chronic lower back pain who presented to the ER after a seizure episode. She was noted to be postictal in the ER with improvement then had another witnessed seizure in the ER. She was evaluated by neurology with recommendations for increasing Keppra  to 1500 mg twice daily.  This controlled patient and she had no further seizure activity during monitoring in the hospital.  She was not felt to warrant EEG due to improvement. MRI brain was also negative for acute findings.  Underlying stable MS noted on imaging.    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  Possible recent viral illness; had a cold 1 week ago per brother that resolved on its own -Continued on Keppra  1500 mg twice daily at discharge per neurology recommendations -No indication for EEG after transfer after discussion with neurology -MRI brain unrevealing and shows stable MS, no signs of flare -Mentation returned back to baseline and confirmed with her brother at bedside prior to discharge   MS -Chronic debility and bedbound state. -- MRI brain with no acute intracranial abnormality   Diabetes mellitus type 2 -Hemoglobin A1c 6.1 - continue home regimen    Mild dementia -Currently not on any medications.   Hyperlipidemia -Resume home statin.   Hypertension - Resume home regimen   Chronic low back pain - Continue home regimen   The patient's acute and chronic medical conditions were treated accordingly. On day of discharge, patient was felt deemed stable for discharge. Patient/family member advised to call PCP or come back to ER if needed.   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     Discharge Instructions     Diet Carb  Modified   Complete by: As directed    Increase activity slowly   Complete by: As directed    No wound care   Complete by: As directed       Allergies as of 01/17/2024       Reactions   Fentanyl  Other (See Comments)   Unk rxn        Medication List     TAKE these medications    amLODipine  10 MG  tablet Commonly known as: NORVASC  Take 1 tablet (10 mg total) by mouth daily.   Aspirin  Low Dose 81 MG chewable tablet Generic drug: aspirin  Chew 1 tablet (81 mg total) by mouth daily.   ciclopirox  8 % solution Commonly known as: PENLAC  Apply topically at bedtime. Apply over nail and surrounding skin. Apply daily over previous coat. After seven (7) days, may remove with alcohol and continue cycle.   Cranberry 500 MG Tabs Take 500 mg by mouth daily.   cyclobenzaprine 10 MG tablet Commonly known as: FLEXERIL Take 10 mg by mouth at bedtime.   etodolac  400 MG tablet Commonly known as: LODINE  TAKE 1 TABLET BY MOUTH TWICE  DAILY AS NEEDED   FIBER CHOICE PO Take 1 tablet by mouth in the morning.   FLUoxetine  20 MG capsule Commonly known as: PROZAC  Take 20 mg by mouth in the morning.   gabapentin  300 MG capsule Commonly known as: NEURONTIN  Take 1 capsule (300 mg total) by mouth 3 (three) times daily.   HumaLOG  KwikPen 100 UNIT/ML KwikPen Generic drug: insulin  lispro Before each meal 3 times a day, 140-199 - 2 units, 200-250 - 6 units, 251-299 - 8 units,  300-349 - 10 units,  350 or above 14 units.   Lantus  SoloStar 100 UNIT/ML Solostar Pen Generic drug: insulin  glargine Inject 55 Units into the skin daily. What changed: how much to take   levETIRAcetam  750 MG tablet Commonly known as: Keppra  Take 2 tablets (1,500 mg total) by mouth 2 (two) times daily. What changed:  medication strength how much to take   levETIRAcetam  750 MG tablet Commonly known as: Keppra  Take 2 tablets (1,500 mg total) by mouth 2 (two) times daily. Start taking on: February 16, 2024 What changed: You were already taking a medication with the same name, and this prescription was added. Make sure you understand how and when to take each.   Lubricant Eye Drops 0.4-0.3 % Soln Generic drug: Polyethyl Glycol-Propyl Glycol Place 1-2 drops into both eyes 3 (three) times daily as needed (dry/irritated  eyes.).   metFORMIN 500 MG 24 hr tablet Commonly known as: GLUCOPHAGE-XR Take 500 mg by mouth daily.   multivitamin with minerals Tabs tablet Take 1 tablet by mouth in the morning.   olmesartan 20 MG tablet Commonly known as: BENICAR Take 20 mg by mouth daily.   omeprazole 20 MG capsule Commonly known as: PRILOSEC Take 20 mg by mouth daily before breakfast.   simvastatin  20 MG tablet Commonly known as: ZOCOR  Take 20 mg by mouth every evening.   triamcinolone cream 0.1 % Commonly known as: KENALOG Apply 1 Application topically daily. After bathing   vitamin C 1000 MG tablet Take 1,000 mg by mouth in the morning.   Vitamin D3 50 MCG (2000 UT) Tabs Take 2,000 Units by mouth daily.        Allergies  Allergen Reactions   Fentanyl  Other (See Comments)    Unk rxn    Consultations: Neurology  Procedures:   Discharge Exam: BP 107/88 (  BP Location: Right Arm)   Pulse 97   Temp (!) 97.2 F (36.2 C)   Resp 19   Ht 5' 2 (1.575 m)   Wt 88.5 kg   SpO2 100%   BMI 35.67 kg/m  Physical Exam Constitutional:      Appearance: Normal appearance.  HENT:     Head: Normocephalic and atraumatic.     Mouth/Throat:     Mouth: Mucous membranes are moist.   Eyes:     Extraocular Movements: Extraocular movements intact.    Cardiovascular:     Rate and Rhythm: Normal rate and regular rhythm.  Pulmonary:     Effort: Pulmonary effort is normal. No respiratory distress.     Breath sounds: Normal breath sounds. No wheezing.  Abdominal:     General: Bowel sounds are normal. There is no distension.     Palpations: Abdomen is soft.     Tenderness: There is no abdominal tenderness.   Musculoskeletal:        General: Normal range of motion.     Cervical back: Normal range of motion and neck supple.   Skin:    General: Skin is warm and dry.   Neurological:     Mental Status: She is alert. Mental status is at baseline.   Psychiatric:        Mood and Affect: Mood  normal.      The results of significant diagnostics from this hospitalization (including imaging, microbiology, ancillary and laboratory) are listed below for reference.   Microbiology: Recent Results (from the past 240 hours)  Urine Culture (for pregnant, neutropenic or urologic patients or patients with an indwelling urinary catheter)     Status: None   Collection Time: 01/16/24  9:37 AM   Specimen: Urine, Catheterized  Result Value Ref Range Status   Specimen Description   Final    URINE, CATHETERIZED Performed at [facility], [address] [place], [place] [address]    Special Requests   Final    NONE Performed at [facility], [address] [place], [place] [address]    Culture   Final    NO GROWTH Performed at [facility], [address], [place], [place] [address]    Report Status 01/17/2024 FINAL  Final     Labs: BNP (last 3 results) No results for input(s): BNP in the last 8760 hours. Basic Metabolic Panel: Recent Labs  Lab 01/15/24 1926 01/16/24 0833 01/17/24 0444  NA 137  --  137  K 4.1  --  3.9  CL 101  --  106  CO2 25  --  27  GLUCOSE 118*  --  146*  BUN 15  --  6  CREATININE 0.45 0.38* 0.40*  CALCIUM 8.9  --  8.1*  MG 2.1  --  2.1  PHOS  --   --  2.8   Liver Function Tests: Recent Labs  Lab 01/17/24 0444  ALBUMIN 2.9*   No results for input(s): LIPASE, AMYLASE in the last 168 hours. No results for input(s): AMMONIA in the last 168 hours. CBC: Recent Labs  Lab 01/15/24 1842 01/16/24 0833 01/17/24 0444  WBC 8.5 7.0 7.0  HGB 14.7 15.1* 13.8  HCT 45.9 47.3* 43.5  MCV 93.1 91.8 93.3  PLT 223 259 190   Cardiac Enzymes: No results for input(s): CKTOTAL, CKMB, CKMBINDEX, TROPONINI in the last 168 hours. BNP: Invalid input(s): POCBNP CBG: Recent Labs  Lab 01/16/24 1150 01/16/24 1651  01/16/24 2146 01/17/24 0610 01/17/24 1230  GLUCAP 137* 285* 184* 119* 158*    D-Dimer No results for input(s): DDIMER in the last 72 hours. Hgb A1c Recent Labs    01/16/24 0220  HGBA1C 6.1*   Lipid Profile No results for input(s): CHOL, HDL, LDLCALC, TRIG, CHOLHDL, LDLDIRECT in the last 72 hours. Thyroid function studies No results for input(s): TSH, T4TOTAL, T3FREE, THYROIDAB in the last 72 hours.  Invalid input(s): FREET3 Anemia work up No results for input(s): VITAMINB12, FOLATE, FERRITIN, TIBC, IRON, RETICCTPCT in the last 72 hours. Urinalysis    Component Value Date/Time   COLORURINE YELLOW 01/16/2024 0937   APPEARANCEUR CLEAR 01/16/2024 0937   LABSPEC 1.015 01/16/2024 0937   PHURINE 5.0 01/16/2024 0937   GLUCOSEU NEGATIVE 01/16/2024 0937   HGBUR NEGATIVE 01/16/2024 0937   BILIRUBINUR NEGATIVE 01/16/2024 0937   KETONESUR NEGATIVE 01/16/2024 0937   PROTEINUR NEGATIVE 01/16/2024 0937   NITRITE NEGATIVE 01/16/2024 0937   LEUKOCYTESUR NEGATIVE 01/16/2024 0937   Sepsis Labs Recent Labs  Lab 01/15/24 1842 01/16/24 0833 01/17/24 0444  WBC 8.5 7.0 7.0   Microbiology Recent Results (from the past 240 hours)  Urine Culture (for pregnant, neutropenic or urologic patients or patients with an indwelling urinary catheter)     Status: None   Collection Time: 01/16/24  9:37 AM   Specimen: Urine, Catheterized  Result Value Ref Range Status   Specimen Description   Final    URINE, CATHETERIZED Performed at [facility], [address] [place], [place] [address]    Special Requests   Final    NONE Performed at [facility], [address] [place], [place] [address]    Culture   Final    NO GROWTH Performed at [facility], [address], [place], [place] [address]    Report Status 01/17/2024 FINAL  Final    Procedures/Studies: MR BRAIN W WO CONTRAST Result Date: 01/16/2024 EXAM: MRI BRAIN WITH AND WITHOUT CONTRAST 01/16/2024 08:20:40 AM TECHNIQUE: Multiplanar  multisequence MRI of the head/brain was performed with and without the administration of intravenous contrast. COMPARISON: MR head without contrast 10/27/2023. CT head without contrast 01/16/2024. CLINICAL HISTORY: Seizure disorder, clinical change. FINDINGS: BRAIN AND VENTRICLES: Advanced atrophy is again noted, most significant in the frontal lobes bilaterally. Diffuse periventricular white matter disease is present with significant volume loss, stable. Ex vacuo dilation of the lateral ventricles is present. Findings are most consistent with severe demyelinating disease. No acute infarct. No acute intracranial hemorrhage. No mass effect or midline shift. No hydrocephalus. The sella is unremarkable. Normal flow voids. No mass or abnormal enhancement. ORBITS: No acute abnormality. SINUSES: Minimal fluid is present in the left sphenoid sinus. No acute abnormality. BONES AND SOFT TISSUES: Normal bone marrow signal and enhancement. No acute soft tissue abnormality. IMPRESSION: 1. No acute intracranial abnormality. 2. Findings consistent with severe demyelinating disease, including advanced atrophy (most significant in the frontal lobes bilaterally), diffuse periventricular white matter disease with significant volume loss, and ex vacuo dilation of the lateral ventricles. Stable compared to prior studies. Electronically signed by: Lonni Necessary MD 01/16/2024 08:29 AM EDT RP Workstation: [id number]   CT HEAD WO CONTRAST ( ) Result Date: 01/16/2024 CLINICAL DATA:  Altered mental status EXAM: CT HEAD WITHOUT CONTRAST TECHNIQUE: Contiguous axial images were obtained from the base of the skull through the vertex without intravenous contrast. RADIATION DOSE REDUCTION: This exam was performed according to the departmental dose-optimization program which includes automated exposure control, adjustment  of the mA and/or kV according to patient size and/or use of iterative reconstruction technique. COMPARISON:   12/29/2022 FINDINGS: Brain: There is no mass, hemorrhage or extra-axial collection. There is generalized atrophy without lobar predilection. Hypodensity of the white matter is most commonly associated with chronic microvascular disease. Vascular: No hyperdense vessel or unexpected vascular calcification. Skull: The visualized skull base, calvarium and extracranial soft tissues are normal. Sinuses/Orbits: No fluid levels or advanced mucosal thickening of the visualized paranasal sinuses. No mastoid or middle ear effusion. Normal orbits. Other: None. IMPRESSION: 1. No acute intracranial abnormality. 2. Generalized atrophy and findings of chronic microvascular disease. Electronically Signed   By: Franky Stanford M.D.   On: 01/16/2024 02:25   DG Chest Portable 1 View Result Date: 01/15/2024 CLINICAL DATA:  Cough and seizure EXAM: PORTABLE CHEST 1 VIEW COMPARISON:  Chest x-ray 12/30/2022 FINDINGS: The heart size and mediastinal contours are within normal limits. Both lungs are clear. The visualized skeletal structures are unremarkable. IMPRESSION: No active disease. Electronically Signed   By: Greig Pique M.D.   On: 01/15/2024 19:49     Time coordinating discharge: Over 30 minutes    Alm Apo, MD  [facility] 01/17/2024, 12:57 PM

## 2024-01-17 NOTE — TOC Initial Note (Signed)
 Transition of Care [facility]) - Initial/Assessment Note    Patient Details  Name: Brandy Mckinney MRN: [id number] Date of Birth: May 12, 1966  [facility]) CM/SW Contact:    Andrez JULIANNA George, RN Phone Number: 01/17/2024, 10:49 AM  Clinical Narrative:                  CM met with the patient and she has some confusion. CM called her brother, Debby. Debby is who she lives with. He is a IT trainer and is on the road for work. She has a caregiver 8 hours of the day/ 7 days per week. There is also family that is in the home the other hours of the day. She is never alone. Caregiver/ family provides her medications.  Brother has handicap fleeta and provides needed transportation. TOC following.  Expected Discharge Plan: Home/Self Care Barriers to Discharge: Continued Medical Work up   Patient Goals and CMS Choice            Expected Discharge Plan and Services   Discharge Planning Services: CM Consult   Living arrangements for the past 2 months: Single Family Home                                      Prior Living Arrangements/Services Living arrangements for the past 2 months: Single Family Home Lives with:: Siblings Patient language and need for interpreter reviewed:: Yes          Care giver support system in place?: Yes (comment) Current home services: DME (adjustable bed/ wheelchair that reclines/ hoyer/ handicap fleeta) Criminal Activity/Legal Involvement Pertinent to Current Situation/Hospitalization: No - Comment as needed  Activities of Daily Living   ADL Screening (condition at time of admission) Independently performs ADLs?: No Does the patient have a NEW difficulty with bathing/dressing/toileting/self-feeding that is expected to last >3 days?: Yes (Initiates electronic notice to provider for possible OT consult) Does the patient have a NEW difficulty with getting in/out of bed, walking, or climbing stairs that is expected to last >3 days?: Yes  (Initiates electronic notice to provider for possible PT consult) Does the patient have a NEW difficulty with communication that is expected to last >3 days?: No Is the patient deaf or have difficulty hearing?: No Does the patient have difficulty seeing, even when wearing glasses/contacts?: No Does the patient have difficulty concentrating, remembering, or making decisions?: Yes  Permission Sought/Granted                  Emotional Assessment Appearance:: Appears stated age Attitude/Demeanor/Rapport:  (confused)   Orientation: : Oriented to Self   Psych Involvement: No (comment)  Admission diagnosis:  Seizure (HCC) [R56.9] Seizure disorder (HCC) [G40.909] Patient Active Problem List   Diagnosis Date Noted   Type 2 diabetes mellitus without complication, with long-term current use of insulin  (HCC) 01/16/2024   HTN (hypertension) 01/16/2024   Seizure disorder (HCC) 01/15/2024   Sepsis secondary to UTI (HCC) 12/29/2022   Acute encephalopathy 12/29/2022   Hyperosmolar hyperglycemic state (HHS) (HCC) 12/29/2022   Early onset Alzheimer's dementia (HCC) 12/29/2022   Right arm weakness 10/21/2021   Chest pain 06/16/2021   Elevated d-dimer 06/16/2021   Acute cholecystitis 06/15/2021   Cholelithiasis without obstruction 07/15/2020   Chronic pain 07/15/2020   Contracture of joint of hand 07/15/2020   Decreased vision 07/15/2020   Hyperglycemia due to type 2 diabetes mellitus (HCC) 07/15/2020  Pure hypercholesterolemia 07/15/2020   Recurrent major 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 AT [address] OF [facility] [address] [place] [address] Phone: [phone] Fax: [phone]  [facility] - [place], [place] - [address] [place] [address] Phone: [phone] Fax: [phone]  [facility] [address] [place] [address] Phone: [phone] Fax: [phone]  [facility] - [address] [place] - [address] [place] [address] Phone: [phone] Fax: [phone]     Social Drivers of Health (SDOH) Social History: SDOH Screenings   Food Insecurity: No Food Insecurity (01/16/2024)  Housing: Low Risk  (01/16/2024)  Transportation Needs: No Transportation Needs (01/16/2024)  Utilities: Not At Risk (01/16/2024)  Tobacco Use: Medium Risk (01/15/2024)   SDOH Interventions:     Readmission Risk Interventions     No data to display

## 2024-01-17 NOTE — Progress Notes (Signed)
 Discharge instructions reviewed with patients brother Debby Balsam and placed in discharge packet for [facility]. Patient discharged to home via [facility]

## 2024-01-17 NOTE — Hospital Course (Signed)
 Patient 58 year old female with a history of MS (bedbound at baseline), seizure disorder, dementia, hyperlipidemia, hypertension, chronic lower back pain who presented to the ER after a seizure episode. She was noted to be postictal in the ER with improvement then had another witnessed seizure in the ER. She was evaluated by neurology with recommendations for increasing Keppra  to 1500 mg twice daily.  This controlled patient and she had no further seizure activity during monitoring in the hospital.  She was not felt to warrant EEG due to improvement. MRI brain was also negative for acute findings.  Underlying stable MS noted on imaging.    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  Possible recent viral illness; had a cold 1 week ago per brother that resolved on its own -Continued on Keppra  1500 mg twice daily at discharge per neurology recommendations -No indication for EEG after transfer after discussion with neurology -MRI brain unrevealing and shows stable MS, no signs of flare -Mentation returned back to baseline and confirmed with her brother at bedside prior to discharge   MS -Chronic debility and bedbound state. -- MRI brain with no acute intracranial abnormality   Diabetes mellitus type 2 -Hemoglobin A1c 6.1 - continue home regimen    Mild dementia -Currently not on any medications.   Hyperlipidemia -Resume home statin.   Hypertension - Resume home regimen   Chronic low back pain - Continue home regimen

## 2024-01-24 DIAGNOSIS — Z09 Encounter for follow-up examination after completed treatment for conditions other than malignant neoplasm: Secondary | ICD-10-CM | POA: Diagnosis not present

## 2024-02-13 DIAGNOSIS — G35 Multiple sclerosis: Secondary | ICD-10-CM | POA: Diagnosis not present

## 2024-02-13 DIAGNOSIS — L22 Diaper dermatitis: Secondary | ICD-10-CM | POA: Diagnosis not present

## 2024-02-24 ENCOUNTER — Ambulatory Visit: Admitting: Podiatry

## 2024-03-14 DIAGNOSIS — L22 Diaper dermatitis: Secondary | ICD-10-CM | POA: Diagnosis not present

## 2024-03-14 DIAGNOSIS — G35 Multiple sclerosis: Secondary | ICD-10-CM | POA: Diagnosis not present

## 2024-04-13 DIAGNOSIS — G35 Multiple sclerosis: Secondary | ICD-10-CM | POA: Diagnosis not present

## 2024-04-13 DIAGNOSIS — L22 Diaper dermatitis: Secondary | ICD-10-CM | POA: Diagnosis not present

## 2024-04-26 ENCOUNTER — Ambulatory Visit: Admitting: Podiatry

## 2024-05-08 NOTE — Progress Notes (Addendum)
 Brandy Mckinney                                          MRN: [id number]   07/04/2024   The [facility] Quality Team Specialist reviewed this patient medical record for the purposes of chart review for care gap closure. The following were reviewed: chart review for care gap closure-kidney health evaluation for diabetes:uACR.    VBCI Quality Team

## 2024-05-13 DIAGNOSIS — L22 Diaper dermatitis: Secondary | ICD-10-CM | POA: Diagnosis not present

## 2024-05-14 ENCOUNTER — Other Ambulatory Visit: Payer: Self-pay | Admitting: Neurology

## 2024-05-15 NOTE — Telephone Encounter (Signed)
 Last seen on 05/24/23 Follow up scheduled on 05/23/24   I don't see medication mentioned in office note, she is taking gabapentin .  Please advise

## 2024-05-23 ENCOUNTER — Encounter: Payer: Self-pay | Admitting: Neurology

## 2024-05-23 ENCOUNTER — Ambulatory Visit: Payer: 59 | Admitting: Neurology

## 2024-05-23 VITALS — BP 112/74 | HR 108

## 2024-05-23 DIAGNOSIS — G35C1 Active secondary progressive multiple sclerosis: Secondary | ICD-10-CM | POA: Diagnosis not present

## 2024-05-23 DIAGNOSIS — Z993 Dependence on wheelchair: Secondary | ICD-10-CM | POA: Diagnosis not present

## 2024-05-23 DIAGNOSIS — G40909 Epilepsy, unspecified, not intractable, without status epilepticus: Secondary | ICD-10-CM

## 2024-05-23 DIAGNOSIS — F0280 Dementia in other diseases classified elsewhere without behavioral disturbance: Secondary | ICD-10-CM

## 2024-05-23 NOTE — Progress Notes (Signed)
 [facility]  PATIENT: Brandy Mckinney DOB: Aug 02, 1965  REFERRING DOCTOR OR PCP: Talitha Repress MD SOURCE: Patient, notes from primary care, imaging and lab reports  _________________________________   HISTORICAL  CHIEF COMPLAINT:  Chief Complaint  Patient presents with   Follow-up    Pt in room 11. Brother in room. Here for MS follow up.    HISTORY OF PRESENT ILLNESS:  Brandy Mckinney is a 58 y.o. woman with an active form of secondary progressive multiple sclerosis.  Update 05/23/2024 She and her brother deny any new MS exacerbations or significant new neurologic symptom.  She had her first Mavenclad course in December 2020/January 2021.  She had a second year December 2021 and  January 2022.  Both times she tolerated the pills well.  She had no side effects.      ALC was normal 0.9 11/22/2021   ALT/AST normal 10/21/2021.     She has had no infection.  She is wheelchair bound.   She does not help with transfers.   She has left arm > right arm weakness and both legs weak, worse on left.  There has been mild decline in strength consistent with her progressive MS she was experiencing dysesthesias in her legs.  Gabapentin  has helped her dysesthesias.   No change in her bladder pattern (she is incontinent) they have not noticed any skin breakdown .  She continues to experience intermittent flank pain, similar to pain she had late last year but milder  She has incontinence and wears Depends.     She has constipation.  Cognition is about with reduced STM, focus/attention.  She gets confused easily.  She jumbles her words and also has word finding difficulties  She went to the ED due to a 01/15/2024 seizure.   The levetiracetam  dose was increased to  bid 750 mg.   She takes levetiracetam  and tolerates it well    Electrolytes and kidney function were fine.  No UTI      MS history:  She was diagnosed with MS at age 32 (89) after presenting with optic neuritis in  both eyes.  Over the next few years, she began to experience more difficulty with her gait.  This steadily progressed in her 11s 30s and 35s.  For many years, she used a walker.  About 7 or 8 years ago after repeated falls she stopped using the walker and became wheelchair-bound.  At some point, she was started on Avonex.  She remained on this medication for many years.  She continued to progress and was switched to Tecfidera.  She stayed on that medication for several years and then in 2017 was switched to Ocrevus. Travel was difficult for her to do. In 2020 she switched to [facility]. She had the first course December 2020 in January 2021.   Other neurologic history: She has a history of seizures. They have been well controlled on levetiracetam .  Data: MRI of the brain dated 02/19/2019 [facility]): It shows multiple T2/flair hyperintense foci in the brainstem, cerebellum and in the periventricular, juxtacortical and deep white matter of the hemispheres in a pattern compatible with MS.  There were no enhancing lesions.  MRI of the brain 11/22/2021 and 04/2020 were unchanged.     I reviewed lab tests from 02/19/2019: CBC was normal.  Lymphocyte count was normal.  She had an mildly elevated ALT but normal AST.  Vitamin D  was normal.  Hepatitis Be antigen and hepatitis B core antibody were  negative.  Hep C antibody and RNA was negative.  HIV was negative.  QuantiFERON TB was negative.  Varicella-zoster IgG showed immunity.   REVIEW OF SYSTEMS: Constitutional: No fevers, chills, sweats, or change in appetite.  She has some insomnia and irregular sleep. Eyes: No eye pain.  She has reduced visual acuity. Ear, nose and throat: No hearing loss, ear pain, nasal congestion, sore throat Cardiovascular: No chest pain, palpitations Respiratory:  No shortness of breath at rest or with exertion.   No wheezes GastrointestinaI: No nausea, vomiting, diarrhea, abdominal pain, fecal  incontinence Genitourinary: She has incontinence  musculoskeletal: She notes myalgias  integumentary: No rash, pruritus, skin lesions Neurological: as above Psychiatric: No depression at this time.  No anxiety Endocrine: No palpitations, diaphoresis, change in appetite, change in weigh or increased thirst Hematologic/Lymphatic:  No anemia, purpura, petechiae. Allergic/Immunologic: No itchy/runny eyes, nasal congestion, recent allergic reactions, rashes  ALLERGIES: Allergies  Allergen Reactions   Fentanyl  Other (See Comments)    Unk rxn    HOME MEDICATIONS:  Current Outpatient Medications:    amLODipine  (NORVASC ) 10 MG tablet, Take 1 tablet (10 mg total) by mouth daily., Disp: 30 tablet, Rfl: 0   Ascorbic Acid (VITAMIN C) 1000 MG tablet, Take 1,000 mg by mouth in the morning., Disp: , Rfl:    aspirin  81 MG chewable tablet, Chew 1 tablet (81 mg total) by mouth daily., Disp: 30 tablet, Rfl: 0   Cholecalciferol  (VITAMIN D3) 50 MCG (2000 UT) TABS, Take 2,000 Units by mouth daily., Disp: , Rfl:    ciclopirox  (PENLAC ) 8 % solution, Apply topically at bedtime. Apply over nail and surrounding skin. Apply daily over previous coat. After seven (7) days, may remove with alcohol  and continue cycle., Disp: 6.6 mL, Rfl: 12   Cranberry 500 MG TABS, Take 500 mg by mouth daily., Disp: , Rfl:    cyclobenzaprine (FLEXERIL) 10 MG tablet, Take 10 mg by mouth at bedtime., Disp: , Rfl:    etodolac  (LODINE ) 400 MG tablet, TAKE 1 TABLET BY MOUTH TWICE  DAILY AS NEEDED, Disp: 200 tablet, Rfl: 2   FLUoxetine  (PROZAC ) 20 MG capsule, Take 20 mg by mouth in the morning., Disp: , Rfl:    gabapentin  (NEURONTIN ) 300 MG capsule, Take 1 capsule (300 mg total) by mouth 3 (three) times daily., Disp: 270 capsule, Rfl: 3   insulin  glargine (LANTUS ) 100 UNIT/ML Solostar Pen, Inject 55 Units into the skin daily. (Patient taking differently: Inject 35 Units into the skin daily.), Disp: 15 mL, Rfl: 0   insulin  lispro (HUMALOG )  100 UNIT/ML KwikPen, Before each meal 3 times a day, 140-199 - 2 units, 200-250 - 6 units, 251-299 - 8 units,  300-349 - 10 units,  350 or above 14 units., Disp: 15 mL, Rfl: 0   Inulin (FIBER CHOICE PO), Take 1 tablet by mouth in the morning., Disp: , Rfl:    levETIRAcetam  (KEPPRA ) 750 MG tablet, Take 2 tablets (1,500 mg total) by mouth 2 (two) times daily., Disp: 120 tablet, Rfl: 0   levETIRAcetam  (KEPPRA ) 750 MG tablet, Take 2 tablets (1,500 mg total) by mouth 2 (two) times daily., Disp: 360 tablet, Rfl: 2   metFORMIN (GLUCOPHAGE-XR) 500 MG 24 hr tablet, Take 500 mg by mouth daily., Disp: , Rfl:    Multiple Vitamin (MULTIVITAMIN WITH MINERALS) TABS tablet, Take 1 tablet by mouth in the morning., Disp: , Rfl:    olmesartan (BENICAR) 20 MG tablet, Take 20 mg by mouth daily., Disp: , Rfl:  omeprazole (PRILOSEC) 20 MG capsule, Take 20 mg by mouth daily before breakfast., Disp: , Rfl:    Polyethyl Glycol-Propyl Glycol (LUBRICANT EYE DROPS) 0.4-0.3 % SOLN, Place 1-2 drops into both eyes 3 (three) times daily as needed (dry/irritated eyes.)., Disp: , Rfl:    simvastatin  (ZOCOR ) 20 MG tablet, Take 20 mg by mouth every evening., Disp: , Rfl:    triamcinolone cream (KENALOG) 0.1 %, Apply 1 Application topically daily. After bathing, Disp: , Rfl:   PAST MEDICAL HISTORY: Past Medical History:  Diagnosis Date   Early onset Alzheimer's dementia (HCC)    Hypercholesteremia    Hyperlipidemia    Hypertension    Lumbosacral pain    Multiple sclerosis    Seizure (HCC)    Vitamin D  deficiency     PAST SURGICAL HISTORY:   FAMILY HISTORY: History reviewed. No pertinent family history.  SOCIAL HISTORY:  Social History   Socioeconomic History   Marital status: Legally Separated    Spouse name: Not on file   Number of children: Not on file   Years of education: Not on file   Highest education level: Not on file  Occupational History   Not on file  Tobacco Use   Smoking status: Former     Current packs/day: 0.00    Types: Cigarettes    Quit date: 2012    Years since quitting: 13.8   Smokeless tobacco: Never  Vaping Use   Vaping status: Never Used  Substance and Sexual Activity   Alcohol  use: Not Currently   Drug use: Never   Sexual activity: Not on file  Other Topics Concern   Not on file  Social History Narrative   Lives at home with brother tommy    Right handed    Social Drivers of Health   Financial Resource Strain: Not on file  Food Insecurity: No Food Insecurity (01/16/2024)   Hunger Vital Sign    Worried About Running Out of Food in the Last Year: Never true    Ran Out of Food in the Last Year: Never true  Transportation Needs: No Transportation Needs (01/16/2024)   PRAPARE - Administrator, Civil Service (Medical): No    Lack of Transportation (Non-Medical): No  Physical Activity: Not on file  Stress: Not on file  Social Connections: Not on file  Intimate Partner Violence: Not At Risk (01/16/2024)   Humiliation, Afraid, Rape, and Kick questionnaire    Fear of Current or Ex-Partner: No    Emotionally Abused: No    Physically Abused: No    Sexually Abused: No     PHYSICAL EXAM  Vitals:   05/23/24 1316  BP: 112/74  Pulse: (!) 108    There is no height or weight on file to calculate BMI.   General: The patient is well-developed and well-nourished and in no acute distress  HEENT:  Head is [place]/AT.  Sclera are anicteric.     Musculoskeletal: She has some muscle tenderness on deep palpation.   Neurologic Exam  Mental status: The patient is alert and oriented to name and city but not date at the time of the examination.  She had reduced focus and attention and reduced immediate recall.   Speech is normal.  Cranial nerves: She has L > R  INO.  There is good facial sensation to soft touch bilaterally facial strength and sensation was normal..  Trapezius and sternocleidomastoid strength is normal.  No obvious hearing deficits are  noted.  Motor:  Muscle bulk is normal.   Muscle tone is increased, left greater than right.  Strength was 3/5 in the left arm and 2/5 in the left leg.  Strength was 4-/5 in the right biceps but 3/5 elsewhere in the right arm  Right leg 3 to 4-/5  Sensory: Sensory testing is intact to pinprick, soft touch and vibration sensation in limbs  Coordination: Cerebellar testing shows poor heel-to-shin  Gait and station: unable to stand or walk  Reflexes: Deep tedon reflexes are increased in the legs..         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  MS is stable.   She did the 2 years of Mavenclad in 2021 and 2022 without complications 2.   Continue Keppra  at higher dose due to recent seizures 3.   Continue gabapentin   300 mg po tid as it may help with the dysesthesias better 4.   rtc 12  months or sooner if there are new or worsening neurologic symptoms.    This visit is part of a comprehensive longitudinal care medical relationship regarding the patients primary diagnosis of SPMS and related concerns.    Conya Ellinwood A. Vear, MD, [facility] 05/23/2024, 1:55 PM Certified in Neurology, Clinical Neurophysiology, Sleep Medicine and Neuroimaging  [facility] [address], [address] [place], [place] [address] [phone]

## 2024-05-31 ENCOUNTER — Ambulatory Visit: Admitting: Podiatry

## 2024-07-05 ENCOUNTER — Ambulatory Visit: Admitting: Podiatry

## 2024-07-16 ENCOUNTER — Ambulatory Visit: Admitting: Podiatry

## 2024-07-16 DIAGNOSIS — Z91199 Patient's noncompliance with other medical treatment and regimen due to unspecified reason: Secondary | ICD-10-CM

## 2024-07-16 NOTE — Progress Notes (Signed)
 No show

## 2024-07-23 ENCOUNTER — Other Ambulatory Visit (HOSPITAL_COMMUNITY): Payer: Self-pay

## 2024-08-13 ENCOUNTER — Ambulatory Visit (INDEPENDENT_AMBULATORY_CARE_PROVIDER_SITE_OTHER): Payer: Self-pay | Admitting: Podiatry

## 2024-08-13 DIAGNOSIS — Z91199 Patient's noncompliance with other medical treatment and regimen due to unspecified reason: Secondary | ICD-10-CM

## 2024-08-13 NOTE — Progress Notes (Signed)
Patient did not show for scheduled appointment today.

## 2024-08-24 ENCOUNTER — Encounter: Payer: Self-pay | Admitting: Podiatry

## 2024-08-24 ENCOUNTER — Ambulatory Visit (INDEPENDENT_AMBULATORY_CARE_PROVIDER_SITE_OTHER): Admitting: Podiatry

## 2024-08-24 DIAGNOSIS — B351 Tinea unguium: Secondary | ICD-10-CM | POA: Diagnosis not present

## 2024-08-24 DIAGNOSIS — M79674 Pain in right toe(s): Secondary | ICD-10-CM | POA: Diagnosis not present

## 2024-08-24 DIAGNOSIS — E1140 Type 2 diabetes mellitus with diabetic neuropathy, unspecified: Secondary | ICD-10-CM

## 2024-08-24 DIAGNOSIS — M79675 Pain in left toe(s): Secondary | ICD-10-CM

## 2024-08-24 NOTE — Progress Notes (Signed)
"  °  Subjective:  Patient ID: Brandy Mckinney, female    DOB: January 20, 1966,  MRN: [id number]  Chief Complaint  Patient presents with   [facility]    [facility] nail trim A1c 6.1 01/16/24. 81 Mg Asprin    59 y.o. female presents with the above complaint. History confirmed with patient. Patient presenting with pain related to dystrophic thickened elongated nails. Patient is unable to trim own nails related to nail dystrophy and/or mobility issues.  Patient is wheelchair-bound and does have MS.  Patient does have a history of T2DM. Her feet are very  sensitive to touch.  Accompanied by brother today.  They report inconsistent use of the Penlac  due to inconsistent applications with her aids.  They also have had difficulties with transportation getting to appointments reliably.  Objective:  Physical Exam: warm, good capillary refill.  Pedal skin atrophic.  Pedal hair growth absent. nail exam onychomycosis of the toenails, onycholysis, and dystrophic nails DP pulses faintly palpable, PT pulses faintly palpable, and protective sensation intact, decreased vibratory sensation Left Foot:  Pain with palpation of nails due to elongation and dystrophic growth.  Right Foot: Pain with palpation of nails due to elongation and dystrophic growth.  Decreased muscle strength in dorsiflexion, plantarflexion, inversion, eversion secondary to MS Assessment:   1. Pain due to onychomycosis of toenails of both feet   2. Type 2 diabetes mellitus with diabetic neuropathy, unspecified whether long term insulin  use (HCC)      Plan:  Patient was evaluated and treated and all questions answered.  #Onychomycosis with pain  -Nails palliatively debrided as below. -Educated on self-care - Reports mixed use of the Penlac  due to inconsistent use from her aids.  May get benefit from using Vicks vapor rub to palliatively soften the toenails over time.  Procedure: Nail Debridement Rationale: Pain Type of Debridement: manual, sharp  debridement. Instrumentation: Nail nipper, rotary burr.  Pretty sensitive this Number of Nails: 10   # Diabetes with neuropathy Patient educated on diabetes. Discussed proper diabetic foot care and discussed risks and complications of disease. Educated patient in depth on reasons to return to the office immediately should she discover anything concerning or new on the feet. All questions answered. Discussed proper shoes as well. She is wheelchair-bound due to MS.  Discussed proper offloading.   Return in about 3 months (around 11/22/2024) for Diabetic Foot Care.         Ethan Saddler, DPM [facility]                     "

## 2024-11-22 ENCOUNTER — Ambulatory Visit: Admitting: Podiatry

## 2025-05-30 ENCOUNTER — Ambulatory Visit: Admitting: Neurology
